# Patient Record
Sex: Female | Born: 1985 | Race: Black or African American | Hispanic: No | Marital: Single | Smoking: Current every day smoker
Health system: Southern US, Community
[De-identification: ages and names within clinical notes are randomized; demographics above are authoritative.]

## PROBLEM LIST (undated history)

## (undated) DIAGNOSIS — O009 Unspecified ectopic pregnancy without intrauterine pregnancy: Secondary | ICD-10-CM

## (undated) DIAGNOSIS — J45909 Unspecified asthma, uncomplicated: Secondary | ICD-10-CM

## (undated) DIAGNOSIS — N739 Female pelvic inflammatory disease, unspecified: Secondary | ICD-10-CM

## (undated) DIAGNOSIS — D649 Anemia, unspecified: Secondary | ICD-10-CM

## (undated) HISTORY — PX: ECTOPIC PREGNANCY SURGERY: SHX613

---
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
## 2005-06-20 ENCOUNTER — Emergency Department: Payer: Self-pay | Admitting: Emergency Medicine

## 2005-06-23 ENCOUNTER — Emergency Department: Payer: Self-pay | Admitting: Emergency Medicine

## 2005-11-26 ENCOUNTER — Emergency Department: Payer: Self-pay | Admitting: Emergency Medicine

## 2006-03-27 ENCOUNTER — Emergency Department: Payer: Self-pay | Admitting: Emergency Medicine

## 2006-04-20 ENCOUNTER — Emergency Department: Payer: Self-pay | Admitting: Emergency Medicine

## 2006-09-25 ENCOUNTER — Emergency Department: Payer: Self-pay | Admitting: Emergency Medicine

## 2006-09-30 ENCOUNTER — Emergency Department: Payer: Self-pay | Admitting: Emergency Medicine

## 2008-02-24 ENCOUNTER — Emergency Department: Payer: Self-pay | Admitting: Emergency Medicine

## 2008-04-15 ENCOUNTER — Emergency Department: Payer: Self-pay

## 2008-06-16 ENCOUNTER — Emergency Department: Payer: Self-pay | Admitting: Emergency Medicine

## 2008-09-03 ENCOUNTER — Emergency Department: Payer: Self-pay | Admitting: Emergency Medicine

## 2009-02-22 ENCOUNTER — Emergency Department: Payer: Self-pay | Admitting: Emergency Medicine

## 2009-03-13 ENCOUNTER — Emergency Department: Payer: Self-pay | Admitting: Internal Medicine

## 2009-07-19 ENCOUNTER — Emergency Department: Payer: Self-pay | Admitting: Unknown Physician Specialty

## 2009-08-14 ENCOUNTER — Emergency Department: Payer: Self-pay | Admitting: Emergency Medicine

## 2010-04-27 ENCOUNTER — Emergency Department (HOSPITAL_COMMUNITY): Admission: EM | Admit: 2010-04-27 | Discharge: 2010-04-27 | Payer: Self-pay | Admitting: Emergency Medicine

## 2011-08-02 ENCOUNTER — Emergency Department: Payer: Self-pay

## 2011-08-02 LAB — URINALYSIS, COMPLETE
Bacteria: NONE SEEN
Glucose,UR: NEGATIVE mg/dL (ref 0–75)
Ketone: NEGATIVE
Leukocyte Esterase: NEGATIVE
Nitrite: NEGATIVE
Protein: NEGATIVE
RBC,UR: 1 /HPF (ref 0–5)
WBC UR: 1 /HPF (ref 0–5)

## 2012-02-04 ENCOUNTER — Emergency Department: Payer: Self-pay | Admitting: *Deleted

## 2012-02-04 LAB — URINALYSIS, COMPLETE
Bacteria: NONE SEEN
Bilirubin,UR: NEGATIVE
Blood: NEGATIVE
Ph: 7 (ref 4.5–8.0)
RBC,UR: NONE SEEN /HPF (ref 0–5)
Specific Gravity: 1.019 (ref 1.003–1.030)
Squamous Epithelial: 1
WBC UR: NONE SEEN /HPF (ref 0–5)

## 2012-02-04 LAB — WET PREP, GENITAL

## 2012-09-05 ENCOUNTER — Emergency Department: Payer: Self-pay | Admitting: Emergency Medicine

## 2012-09-06 ENCOUNTER — Emergency Department: Payer: Self-pay | Admitting: Emergency Medicine

## 2013-08-04 ENCOUNTER — Emergency Department: Payer: Self-pay | Admitting: Emergency Medicine

## 2013-08-04 LAB — BASIC METABOLIC PANEL
Anion Gap: 7 (ref 7–16)
BUN: 10 mg/dL (ref 7–18)
CALCIUM: 8.7 mg/dL (ref 8.5–10.1)
CO2: 25 mmol/L (ref 21–32)
CREATININE: 0.87 mg/dL (ref 0.60–1.30)
Chloride: 109 mmol/L — ABNORMAL HIGH (ref 98–107)
EGFR (African American): >60
EGFR (Non-African Amer.): >60
GLUCOSE: 88 mg/dL (ref 65–99)
Osmolality: 280 (ref 275–301)
Potassium: 3.5 mmol/L (ref 3.5–5.1)
SODIUM: 141 mmol/L (ref 136–145)

## 2013-08-04 LAB — CBC
HCT: 40.3 % (ref 35.0–47.0)
HGB: 13.4 g/dL (ref 12.0–16.0)
MCH: 31 pg (ref 26.0–34.0)
MCHC: 33.3 g/dL (ref 32.0–36.0)
MCV: 93 fL (ref 80–100)
PLATELETS: 306 10*3/uL (ref 150–440)
RBC: 4.32 10*6/uL (ref 3.80–5.20)
RDW: 12.3 % (ref 11.5–14.5)
WBC: 9.5 10*3/uL (ref 3.6–11.0)

## 2013-08-04 LAB — TROPONIN I: Troponin-I: <0.02 ng/mL

## 2013-08-13 ENCOUNTER — Emergency Department: Payer: Self-pay | Admitting: Emergency Medicine

## 2013-08-13 LAB — COMPREHENSIVE METABOLIC PANEL
ALT: 18 U/L (ref 12–78)
Albumin: 3.7 g/dL (ref 3.4–5.0)
Alkaline Phosphatase: 81 U/L
Anion Gap: 2 — ABNORMAL LOW (ref 7–16)
BUN: 13 mg/dL (ref 7–18)
Bilirubin,Total: 0.1 mg/dL — ABNORMAL LOW (ref 0.2–1.0)
Calcium, Total: 8.7 mg/dL (ref 8.5–10.1)
Chloride: 107 mmol/L (ref 98–107)
Co2: 28 mmol/L (ref 21–32)
Creatinine: 0.86 mg/dL (ref 0.60–1.30)
EGFR (Non-African Amer.): >60
GLUCOSE: 77 mg/dL (ref 65–99)
Osmolality: 273 (ref 275–301)
POTASSIUM: 3.6 mmol/L (ref 3.5–5.1)
SGOT(AST): 23 U/L (ref 15–37)
SODIUM: 137 mmol/L (ref 136–145)
TOTAL PROTEIN: 7.4 g/dL (ref 6.4–8.2)

## 2013-08-13 LAB — CBC WITH DIFFERENTIAL/PLATELET
Basophil #: 0.1 10*3/uL (ref 0.0–0.1)
Basophil %: 1.2 %
EOS PCT: 2.1 %
Eosinophil #: 0.2 10*3/uL (ref 0.0–0.7)
HCT: 39.7 % (ref 35.0–47.0)
HGB: 13.4 g/dL (ref 12.0–16.0)
Lymphocyte #: 2.7 10*3/uL (ref 1.0–3.6)
Lymphocyte %: 33.1 %
MCH: 31.4 pg (ref 26.0–34.0)
MCHC: 33.8 g/dL (ref 32.0–36.0)
MCV: 93 fL (ref 80–100)
Monocyte #: 0.4 x10 3/mm (ref 0.2–0.9)
Monocyte %: 5.1 %
NEUTROS ABS: 4.8 10*3/uL (ref 1.4–6.5)
NEUTROS PCT: 58.5 %
Platelet: 244 10*3/uL (ref 150–440)
RBC: 4.27 10*6/uL (ref 3.80–5.20)
RDW: 12.4 % (ref 11.5–14.5)
WBC: 8.2 10*3/uL (ref 3.6–11.0)

## 2013-08-13 LAB — URINALYSIS, COMPLETE
Bacteria: NONE SEEN
Bilirubin,UR: NEGATIVE
Glucose,UR: NEGATIVE mg/dL (ref 0–75)
KETONE: NEGATIVE
Leukocyte Esterase: NEGATIVE
Nitrite: NEGATIVE
PH: 5 (ref 4.5–8.0)
PROTEIN: NEGATIVE
Specific Gravity: 1.02 (ref 1.003–1.030)

## 2015-02-28 ENCOUNTER — Encounter: Payer: Self-pay | Admitting: Emergency Medicine

## 2015-02-28 ENCOUNTER — Emergency Department
Admission: EM | Admit: 2015-02-28 | Discharge: 2015-02-28 | Disposition: A | Discharge status: Home / Self Care | Payer: Self-pay | Attending: Emergency Medicine | Admitting: Emergency Medicine

## 2015-02-28 ENCOUNTER — Emergency Department: Payer: Self-pay

## 2015-02-28 DIAGNOSIS — M25531 Pain in right wrist: Secondary | ICD-10-CM | POA: Insufficient documentation

## 2015-02-28 DIAGNOSIS — Z720 Tobacco use: Secondary | ICD-10-CM | POA: Insufficient documentation

## 2015-02-28 HISTORY — DX: Unspecified ectopic pregnancy without intrauterine pregnancy: O00.90

## 2015-02-28 HISTORY — DX: Unspecified asthma, uncomplicated: J45.909

## 2015-02-28 NOTE — ED Notes (Signed)
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  Pt reports increased pain, cramping and swelling of R wrist, that radiates to front of R shoulder and back of R neck.

## 2015-02-28 NOTE — ED Provider Notes (Signed)
[facility] Emergency Department Provider Note  ____________________________________________  Time seen: 4:40 PM  I have reviewed the triage vital signs and the nursing notes.   HISTORY  Chief Complaint Wrist Pain     HPI Nicole Gross is a 29 y.o. female presents with history of right wrist pain is currently 4 out of 10 worse with repetitive motion. Patient states that she works at [employer] and does to say motion repetitively with her right hand/wrist patient denies any blunt trauma to the wrist. States pain is worse with dorsiflexion of the wrist. Patient denies any numbness no weakness pain does not radiate into the hand/fingers.     Past Medical History  Diagnosis Date  . Asthma   . Ectopic pregnancy     x 2    There are no active problems to display for this patient.   Past surgical history None No current outpatient prescriptions on file.  Allergies No known drug allergies No family history on file.  Social History Social History  Substance Use Topics  . Smoking status: Current Every Day Smoker -- 0.50 packs/day    Types: Cigarettes  . Smokeless tobacco: None  . Alcohol Use: No    Review of Systems  Constitutional: Negative for fever. Eyes: Negative for visual changes. ENT: Negative for sore throat. Cardiovascular: Negative for chest pain. Respiratory: Negative for shortness of breath. Gastrointestinal: Negative for abdominal pain, vomiting and diarrhea. Genitourinary: Negative for dysuria. Musculoskeletal: Negative for back pain. Positive for right wrist pain Skin: Negative for rash. Neurological: Negative for headaches, focal weakness or numbness.   10-point ROS otherwise negative.  ____________________________________________   PHYSICAL EXAM:  VITAL SIGNS: ED Triage Vitals  Enc Vitals Group     BP 02/28/15 1523 112/59 mmHg     Pulse Rate 02/28/15 1523 67     Resp 02/28/15 1523 20     Temp 02/28/15 1523 98.2  F (36.8 C)     Temp Source 02/28/15 1523 Oral     SpO2 02/28/15 1523 99 %     Weight 02/28/15 1523 147 lb (66.679 kg)     Height 02/28/15 1523 4\' 11"  (1.499 m)     Head Cir --      Peak Flow --      Pain Score 02/28/15 1524 0     Pain Loc --      Pain Edu? --      Excl. in [place]? --      Constitutional: Alert and oriented. Well appearing and in no distress. Eyes: Conjunctivae are normal. PERRL. Normal extraocular movements. ENT   Head: Normocephalic and atraumatic.   Nose: No congestion/rhinnorhea.   Mouth/Throat: Mucous membranes are moist.   Neck: No stridor. Hematological/Lymphatic/Immunilogical: No cervical lymphadenopathy. Cardiovascular: Normal rate, regular rhythm. Normal and symmetric distal pulses are present in all extremities. No murmurs, rubs, or gallops. Respiratory: Normal respiratory effort without tachypnea nor retractions. Breath sounds are clear and equal bilaterally. No wheezes/rales/rhonchi. Gastrointestinal: Soft and nontender. No distention. There is no CVA tenderness. Genitourinary: deferred Musculoskeletal: Nontender with normal range of motion in all extremities. No joint effusions.  No lower extremity tenderness nor edema. Pain with dorsiflexion of the wrist both active and passive. Neurologic:  Normal speech and language. No gross focal neurologic deficits are appreciated. Speech is normal.  Skin:  Skin is warm, dry and intact. No rash noted. Psychiatric: Mood and affect are normal. Speech and behavior are normal. Patient exhibits appropriate insight and judgment.  RADIOLOGY  Right wrist x-ray revealed Results       DG Wrist Complete Right (Final result) Result time: 02/28/15 16:11:49   Final result by Rad Results In Interface (02/28/15 16:11:49)   Narrative:   CLINICAL DATA: Right wrist injury at work 02/25/2015. Continued pain. Initial encounter.  EXAM: RIGHT WRIST - COMPLETE 3+ VIEW  COMPARISON: None.  FINDINGS: No  acute bony or joint abnormality is identified. Soft tissues are unremarkable. Mild ulnar minus variance is noted.  IMPRESSION: No acute finding.   Electronically Signed By: Inge Rise M.D. On: 02/28/2015 16:11            INITIAL IMPRESSION / ASSESSMENT AND PLAN / ED COURSE  Pertinent labs & imaging results that were available during my care of the patient were reviewed by me and considered in my medical decision making (see chart for details).  History of physical exam consistent with possible ligamentous injury to the right wrist versus carpal tunnel however negative prior/Phalen test. Pain does not radiate a median nerve distribution. P right wrist cock-up splint was applied patient received ibuprofen 800 mg advised to wear the splint total resolution of discomfort. However given the possibility of carpal tunnel patient is advised to follow-up with Dr. Roland Rack orthopedic surgeon if pain reoccurs and certainly pain starts to radiate along the median nerve distribution. ________________________________________   FINAL CLINICAL IMPRESSION(S) / ED DIAGNOSES  Final diagnoses:  Wrist pain, acute, right      Gregor Hams, MD 02/28/15 [phone]

## 2015-02-28 NOTE — Discharge Instructions (Signed)

## 2015-05-31 ENCOUNTER — Encounter: Payer: Self-pay | Admitting: Medical Oncology

## 2015-05-31 ENCOUNTER — Emergency Department
Admission: EM | Admit: 2015-05-31 | Discharge: 2015-05-31 | Disposition: A | Discharge status: Home / Self Care | Payer: Self-pay | Attending: Emergency Medicine | Admitting: Emergency Medicine

## 2015-05-31 ENCOUNTER — Emergency Department: Payer: Self-pay

## 2015-05-31 DIAGNOSIS — J209 Acute bronchitis, unspecified: Secondary | ICD-10-CM | POA: Insufficient documentation

## 2015-05-31 DIAGNOSIS — F17210 Nicotine dependence, cigarettes, uncomplicated: Secondary | ICD-10-CM | POA: Insufficient documentation

## 2015-05-31 DIAGNOSIS — Z880 Allergy status to penicillin: Secondary | ICD-10-CM | POA: Insufficient documentation

## 2015-05-31 DIAGNOSIS — J40 Bronchitis, not specified as acute or chronic: Secondary | ICD-10-CM

## 2015-05-31 DIAGNOSIS — J069 Acute upper respiratory infection, unspecified: Secondary | ICD-10-CM | POA: Insufficient documentation

## 2015-05-31 MED ORDER — GUAIFENESIN-CODEINE 100-10 MG/5ML PO SOLN: 10.0000 mL | ORAL | Status: DC | PRN

## 2015-05-31 MED ORDER — AZITHROMYCIN 250 MG PO TABS: ORAL_TABLET | ORAL | Status: DC

## 2015-05-31 NOTE — ED Notes (Signed)
Pt reports cold sx's x 1 week with sore throat and cough with sob- hx of asthma.

## 2015-05-31 NOTE — Discharge Instructions (Signed)
Upper Respiratory Infection, Adult Most upper respiratory infections (URIs) are a viral infection of the air passages leading to the lungs. A URI affects the nose, throat, and upper air passages. The most common type of URI is nasopharyngitis and is typically referred to as "the common cold." URIs run their course and usually go away on their own. Most of the time, a URI does not require medical attention, but sometimes a bacterial infection in the upper airways can follow a viral infection. This is called a secondary infection. Sinus and middle ear infections are common types of secondary upper respiratory infections. Bacterial pneumonia can also complicate a URI. A URI can worsen asthma and chronic obstructive pulmonary disease (COPD). Sometimes, these complications can require emergency medical care and may be life threatening.  CAUSES Almost all URIs are caused by viruses. A virus is a type of germ and can spread from one person to another.  RISKS FACTORS You may be at risk for a URI if:   You smoke.   You have chronic heart or lung disease.  You have a weakened defense (immune) system.   You are very young or very old.   You have nasal allergies or asthma.  You work in crowded or poorly ventilated areas.  You work in health care facilities or schools. SIGNS AND SYMPTOMS  Symptoms typically develop 2-3 days after you come in contact with a cold virus. Most viral URIs last 7-10 days. However, viral URIs from the influenza virus (flu virus) can last 14-18 days and are typically more severe. Symptoms may include:   Runny or stuffy (congested) nose.   Sneezing.   Cough.   Sore throat.   Headache.   Fatigue.   Fever.   Loss of appetite.   Pain in your forehead, behind your eyes, and over your cheekbones (sinus pain).  Muscle aches.  DIAGNOSIS  Your health care provider may diagnose a URI by:  Physical exam.  Tests to check that your symptoms are not due to  another condition such as:  Strep throat.  Sinusitis.  Pneumonia.  Asthma. TREATMENT  A URI goes away on its own with time. It cannot be cured with medicines, but medicines may be prescribed or recommended to relieve symptoms. Medicines may help:  Reduce your fever.  Reduce your cough.  Relieve nasal congestion. HOME CARE INSTRUCTIONS   Take medicines only as directed by your health care provider.   Gargle warm saltwater or take cough drops to comfort your throat as directed by your health care provider.  Use a warm mist humidifier or inhale steam from a shower to increase air moisture. This may make it easier to breathe.  Drink enough fluid to keep your urine clear or pale yellow.   Eat soups and other clear broths and maintain good nutrition.   Rest as needed.   Return to work when your temperature has returned to normal or as your health care provider advises. You may need to stay home longer to avoid infecting others. You can also use a face mask and careful hand washing to prevent spread of the virus.  Increase the usage of your inhaler if you have asthma.   Do not use any tobacco products, including cigarettes, chewing tobacco, or electronic cigarettes. If you need help quitting, ask your health care provider. PREVENTION  The best way to protect yourself from getting a cold is to practice good hygiene.   Avoid oral or hand contact with people with cold   symptoms.   Wash your hands often if contact occurs.  There is no clear evidence that vitamin C, vitamin E, echinacea, or exercise reduces the chance of developing a cold. However, it is always recommended to get plenty of rest, exercise, and practice good nutrition.  SEEK MEDICAL CARE IF:   You are getting worse rather than better.   Your symptoms are not controlled by medicine.   You have chills.  You have worsening shortness of breath.  You have brown or red mucus.  You have yellow or brown nasal  discharge.  You have pain in your face, especially when you bend forward.  You have a fever.  You have swollen neck glands.  You have pain while swallowing.  You have white areas in the back of your throat. SEEK IMMEDIATE MEDICAL CARE IF:   You have severe or persistent:  Headache.  Ear pain.  Sinus pain.  Chest pain.  You have chronic lung disease and any of the following:  Wheezing.  Prolonged cough.  Coughing up blood.  A change in your usual mucus.  You have a stiff neck.  You have changes in your:  Vision.  Hearing.  Thinking.  Mood. MAKE SURE YOU:   Understand these instructions.  Will watch your condition.  Will get help right away if you are not doing well or get worse.   This information is not intended to replace advice given to you by your health care provider. Make sure you discuss any questions you have with your health care provider.   Document Released: 12/20/2000 Document Revised: 11/10/2014 Document Reviewed: 10/01/2013 Elsevier Interactive Patient Education 2016 [employer]  

## 2015-05-31 NOTE — ED Notes (Signed)
States she has had a cough for about 1 week  With some fever.states she is having some discomfort in chest with cough and breathing

## 2015-05-31 NOTE — ED Provider Notes (Signed)
[facility] Emergency Department Provider Note  ____________________________________________  Time seen: Approximately 7:35 PM  I have reviewed the triage vital signs and the nursing notes.   HISTORY  Chief Complaint URI    HPI Nicole Gross is a 29 y.o. female presents for evaluation of cough and chest pains for one week. Patient states that her difficulty or discomfort in her chest especially with deep breathing and coughing. She reports having a history of fever and chills with some body aches and coughing and congestion. Symptoms slightly improved at this time.   Past Medical History  Diagnosis Date  . Asthma   . Ectopic pregnancy     x 2    There are no active problems to display for this patient.   History reviewed. No pertinent past surgical history.  Current Outpatient Rx  Name  Route  Sig  Dispense  Refill  . azithromycin (ZITHROMAX Z-PAK) 250 MG tablet      Take 2 tablets (500 mg) on  Day 1,  followed by 1 tablet (250 mg) once daily on Days 2 through 5.   6 each   0   . guaiFENesin-codeine 100-10 MG/5ML syrup   Oral   Take 10 mLs by mouth every 4 (four) hours as needed for cough.   180 mL   0     Allergies Penicillins  No family history on file.  Social History Social History  Substance Use Topics  . Smoking status: Current Every Day Smoker -- 0.50 packs/day    Types: Cigarettes  . Smokeless tobacco: None  . Alcohol Use: No    Review of Systems Constitutional: No fever/chills Eyes: No visual changes. ENT: No sore throat. Cardiovascular: Complains of chest pain nonradiating. Respiratory: Denies shortness of breath. Positive for cough chest congestion Gastrointestinal: No abdominal pain.  No nausea, no vomiting.  No diarrhea.  No constipation. Genitourinary: Negative for dysuria. Musculoskeletal: Negative for back pain. Skin: Negative for rash. Neurological: Negative for headaches, focal weakness or  numbness.  10-point ROS otherwise negative.  ____________________________________________   PHYSICAL EXAM:  VITAL SIGNS: ED Triage Vitals  Enc Vitals Group     BP 05/31/15 1821 114/67 mmHg     Pulse Rate 05/31/15 1821 70     Resp 05/31/15 1821 18     Temp 05/31/15 1821 98.4 F (36.9 C)     Temp Source 05/31/15 1821 Oral     SpO2 05/31/15 1821 100 %     Weight 05/31/15 1821 150 lb (68.04 kg)     Height 05/31/15 1821 4\' 11"  (1.499 m)     Head Cir --      Peak Flow --      Pain Score 05/31/15 1821 7     Pain Loc --      Pain Edu? --      Excl. in [place]? --     Constitutional: Alert and oriented. Well appearing and in no acute distress. Eyes: Conjunctivae are normal. PERRL. EOMI. Head: Atraumatic. Nose: No congestion/rhinnorhea. Mouth/Throat: Mucous membranes are moist.  Oropharynx non-erythematous. Neck: No stridor.   Cardiovascular: Normal rate, regular rhythm. Grossly normal heart sounds.  Good peripheral circulation. Respiratory: Normal respiratory effort.  No retractions. Lungs with coarse rhonchi. Musculoskeletal: No lower extremity tenderness nor edema.  No joint effusions. Neurologic:  Normal speech and language. No gross focal neurologic deficits are appreciated. No gait instability. Skin:  Skin is warm, dry and intact. No rash noted. Psychiatric: Mood and affect are normal.  Speech and behavior are normal.  ____________________________________________   LABS (all labs ordered are listed, but only abnormal results are displayed)  Labs Reviewed - No data to display ____________________________________________  EKG  Normal sinus rhythm with nonspecific T-wave abnormality. ____________________________________________  RADIOLOGY  Negative for pneumonia. ____________________________________________   PROCEDURES  Procedure(s) performed: None  Critical Care performed: No  ____________________________________________   INITIAL IMPRESSION / ASSESSMENT AND  PLAN / ED COURSE  Pertinent labs & imaging results that were available during my care of the patient were reviewed by me and considered in my medical decision making (see chart for details).  Acute upper respiratory infection with bronchitis. Rx given for Z-Pak and Robitussin-AC. Patient voices no other emergency medical complaints at this time she will return to the ER with any worsening symptomology or follow-up with her PCP. ____________________________________________   FINAL CLINICAL IMPRESSION(S) / ED DIAGNOSES  Final diagnoses:  Bronchitis  URI, acute      Arlyss Repress, PA-C 05/31/15 2126  Orbie Pyo, MD 05/31/15 2342

## 2015-06-15 ENCOUNTER — Encounter: Payer: Self-pay | Admitting: *Deleted

## 2015-06-15 ENCOUNTER — Emergency Department
Admission: EM | Admit: 2015-06-15 | Discharge: 2015-06-15 | Disposition: A | Discharge status: Home / Self Care | Payer: Self-pay | Attending: Emergency Medicine | Admitting: Emergency Medicine

## 2015-06-15 DIAGNOSIS — J069 Acute upper respiratory infection, unspecified: Secondary | ICD-10-CM | POA: Insufficient documentation

## 2015-06-15 DIAGNOSIS — H9201 Otalgia, right ear: Secondary | ICD-10-CM | POA: Insufficient documentation

## 2015-06-15 DIAGNOSIS — Z880 Allergy status to penicillin: Secondary | ICD-10-CM | POA: Insufficient documentation

## 2015-06-15 DIAGNOSIS — J029 Acute pharyngitis, unspecified: Secondary | ICD-10-CM | POA: Insufficient documentation

## 2015-06-15 DIAGNOSIS — F17210 Nicotine dependence, cigarettes, uncomplicated: Secondary | ICD-10-CM | POA: Insufficient documentation

## 2015-06-15 DIAGNOSIS — J45909 Unspecified asthma, uncomplicated: Secondary | ICD-10-CM | POA: Insufficient documentation

## 2015-06-15 DIAGNOSIS — Z792 Long term (current) use of antibiotics: Secondary | ICD-10-CM | POA: Insufficient documentation

## 2015-06-15 LAB — POCT RAPID STREP A: STREPTOCOCCUS, GROUP A SCREEN (DIRECT): NEGATIVE

## 2015-06-15 MED ORDER — AZITHROMYCIN 250 MG PO TABS
500.0000 mg | ORAL_TABLET | Freq: Once | ORAL | Status: AC
  Administered 2015-06-15: 500 mg via ORAL
  Filled 2015-06-15: qty 2

## 2015-06-15 MED ORDER — GUAIFENESIN-CODEINE 100-10 MG/5ML PO SOLN
10.0000 mL | Freq: Once | ORAL | Status: AC
  Administered 2015-06-15: 10 mL via ORAL
  Filled 2015-06-15: qty 10

## 2015-06-15 NOTE — ED Provider Notes (Signed)
[facility] Emergency Department Provider Note  ____________________________________________  Time seen: Approximately 6:12 PM  I have reviewed the triage vital signs and the nursing notes.   HISTORY  Chief Complaint Sore Throat    HPI Nicole Gross is a 29 y.o. female who presents today for sore throat and right ear pain. Patient was seen here last week for URI but did not take any medications states that she denies any to get it filled. Needs a note for work. States that it does hurt to swallow   Past Medical History  Diagnosis Date  . Asthma   . Ectopic pregnancy     x 2    There are no active problems to display for this patient.   No past surgical history on file.  Current Outpatient Rx  Name  Route  Sig  Dispense  Refill  . azithromycin (ZITHROMAX Z-PAK) 250 MG tablet      Take 2 tablets (500 mg) on  Day 1,  followed by 1 tablet (250 mg) once daily on Days 2 through 5.   6 each   0   . guaiFENesin-codeine 100-10 MG/5ML syrup   Oral   Take 10 mLs by mouth every 4 (four) hours as needed for cough.   180 mL   0     Allergies Penicillins  No family history on file.  Social History Social History  Substance Use Topics  . Smoking status: Current Every Day Smoker -- 0.50 packs/day    Types: Cigarettes  . Smokeless tobacco: None  . Alcohol Use: No    Review of Systems Constitutional: No fever/chills Eyes: No visual changes. ENT: Positive sore throat, positive head congestion and nasal drainage. Cardiovascular: Denies chest pain. Respiratory: Denies shortness of breath. Positive cough Gastrointestinal: No abdominal pain.  No nausea, no vomiting.  No diarrhea.  No constipation. Genitourinary: Negative for dysuria. Musculoskeletal: Negative for back pain. Skin: Negative for rash. Neurological: Negative for headaches, focal weakness or numbness.  10-point ROS otherwise  negative.  ____________________________________________   PHYSICAL EXAM:  VITAL SIGNS: ED Triage Vitals  Enc Vitals Group     BP --      Pulse --      Resp --      Temp --      Temp src --      SpO2 --      Weight --      Height --      Head Cir --      Peak Flow --      Pain Score --      Pain Loc --      Pain Edu? --      Excl. in [place]? --     Constitutional: Alert and oriented. Well appearing and in no acute distress. Eyes: Conjunctivae are normal. PERRL. EOMI. Head: Atraumatic. Nose: Positive congestion/rhinnorhea. Mouth/Throat: Mucous membranes are moist.  Oropharynx non-erythematous. Neck: No stridor. No cervical adenopathy appreciated.  Cardiovascular: Normal rate, regular rhythm. Grossly normal heart sounds.  Good peripheral circulation. Respiratory: Normal respiratory effort.  No retractions. Lungs CTAB. Musculoskeletal: No lower extremity tenderness nor edema.  No joint effusions. Neurologic:  Normal speech and language. No gross focal neurologic deficits are appreciated. No gait instability. Skin:  Skin is warm, dry and intact. No rash noted. Psychiatric: Mood and affect are normal. Speech and behavior are normal.  ____________________________________________   LABS (all labs ordered are listed, but only abnormal results are displayed)  Labs Reviewed  POCT RAPID STREP A   ____________________________________________    PROCEDURES  Procedure(s) performed: None  Critical Care performed: No  ____________________________________________   INITIAL IMPRESSION / ASSESSMENT AND PLAN / ED COURSE  Pertinent labs & imaging results that were available during my care of the patient were reviewed by me and considered in my medical decision making (see chart for details).  Acute URI with pharyngitis. Patient is instructed to get her medications filled from the last visit and follow up with PCP or return here with any worsening symptomology. Work note given  for today. Patient to follow up as needed. ____________________________________________   FINAL CLINICAL IMPRESSION(S) / ED DIAGNOSES  Final diagnoses:  Acute pharyngitis, unspecified pharyngitis type  URI, acute      Arlyss Repress, PA-C 06/15/15 1836  Eula Listen, MD 06/15/15 2003

## 2015-06-15 NOTE — ED Notes (Signed)
Pt has a sore throat and cough.  Recent uri.  Sx for 2 weeks.  cig smoker

## 2015-06-15 NOTE — ED Notes (Signed)
poct strep Negative.  

## 2015-06-15 NOTE — Discharge Instructions (Signed)
Pharyngitis °Pharyngitis is redness, pain, and swelling (inflammation) of your pharynx.  °CAUSES  °Pharyngitis is usually caused by infection. Most of the time, these infections are from viruses (viral) and are part of a cold. However, sometimes pharyngitis is caused by bacteria (bacterial). Pharyngitis can also be caused by allergies. Viral pharyngitis may be spread from person to person by coughing, sneezing, and personal items or utensils (cups, forks, spoons, toothbrushes). Bacterial pharyngitis may be spread from person to person by more intimate contact, such as kissing.  °SIGNS AND SYMPTOMS  °Symptoms of pharyngitis include:   °· Sore throat.   °· Tiredness (fatigue).   °· Low-grade fever.   °· Headache. °· Joint pain and muscle aches. °· Skin rashes. °· Swollen lymph nodes. °· Plaque-like film on throat or tonsils (often seen with bacterial pharyngitis). °DIAGNOSIS  °Your health care provider will ask you questions about your illness and your symptoms. Your medical history, along with a physical exam, is often all that is needed to diagnose pharyngitis. Sometimes, a rapid strep test is done. Other lab tests may also be done, depending on the suspected cause.  °TREATMENT  °Viral pharyngitis will usually get better in 3-4 days without the use of medicine. Bacterial pharyngitis is treated with medicines that kill germs (antibiotics).  °HOME CARE INSTRUCTIONS  °· Drink enough water and fluids to keep your urine clear or pale yellow.   °· Only take over-the-counter or prescription medicines as directed by your health care provider:   °· If you are prescribed antibiotics, make sure you finish them even if you start to feel better.   °· Do not take aspirin.   °· Get lots of rest.   °· Gargle with 8 oz of salt water (½ tsp of salt per 1 qt of water) as often as every 1-2 hours to soothe your throat.   °· Throat lozenges (if you are not at risk for choking) or sprays may be used to soothe your throat. °SEEK MEDICAL  CARE IF:  °· You have large, tender lumps in your neck. °· You have a rash. °· You cough up green, yellow-brown, or bloody spit. °SEEK IMMEDIATE MEDICAL CARE IF:  °· Your neck becomes stiff. °· You drool or are unable to swallow liquids. °· You vomit or are unable to keep medicines or liquids down. °· You have severe pain that does not go away with the use of recommended medicines. °· You have trouble breathing (not caused by a stuffy nose). °MAKE SURE YOU:  °· Understand these instructions. °· Will watch your condition. °· Will get help right away if you are not doing well or get worse. °  °This information is not intended to replace advice given to you by your health care provider. Make sure you discuss any questions you have with your health care provider. °  °Document Released: 06/26/2005 Document Revised: 04/16/2013 Document Reviewed: 03/03/2013 °Elsevier Interactive Patient Education ©2016 [employer] °Upper Respiratory Infection, Adult °Most upper respiratory infections (URIs) are a viral infection of the air passages leading to the lungs. A URI affects the nose, throat, and upper air passages. The most common type of URI is nasopharyngitis and is typically referred to as "the common cold." °URIs run their course and usually go away on their own. Most of the time, a URI does not require medical attention, but sometimes a bacterial infection in the upper airways can follow a viral infection. This is called a secondary infection. Sinus and middle ear infections are common types of secondary upper respiratory infections. °Bacterial pneumonia   can also complicate a URI. A URI can worsen asthma and chronic obstructive pulmonary disease (COPD). Sometimes, these complications can require emergency medical care and may be life threatening.  °CAUSES °Almost all URIs are caused by viruses. A virus is a type of germ and can spread from one person to another.  °RISKS FACTORS °You may be at risk for a URI if:  °· You  smoke.   °· You have chronic heart or lung disease. °· You have a weakened defense (immune) system.   °· You are very young or very old.   °· You have nasal allergies or asthma. °· You work in crowded or poorly ventilated areas. °· You work in health care facilities or schools. °SIGNS AND SYMPTOMS  °Symptoms typically develop 2-3 days after you come in contact with a cold virus. Most viral URIs last 7-10 days. However, viral URIs from the influenza virus (flu virus) can last 14-18 days and are typically more severe. Symptoms may include:  °· Runny or stuffy (congested) nose.   °· Sneezing.   °· Cough.   °· Sore throat.   °· Headache.   °· Fatigue.   °· Fever.   °· Loss of appetite.   °· Pain in your forehead, behind your eyes, and over your cheekbones (sinus pain). °· Muscle aches.   °DIAGNOSIS  °Your health care provider may diagnose a URI by: °· Physical exam. °· Tests to check that your symptoms are not due to another condition such as: °¨ Strep throat. °¨ Sinusitis. °¨ Pneumonia. °¨ Asthma. °TREATMENT  °A URI goes away on its own with time. It cannot be cured with medicines, but medicines may be prescribed or recommended to relieve symptoms. Medicines may help: °· Reduce your fever. °· Reduce your cough. °· Relieve nasal congestion. °HOME CARE INSTRUCTIONS  °· Take medicines only as directed by your health care provider.   °· Gargle warm saltwater or take cough drops to comfort your throat as directed by your health care provider. °· Use a warm mist humidifier or inhale steam from a shower to increase air moisture. This may make it easier to breathe. °· Drink enough fluid to keep your urine clear or pale yellow.   °· Eat soups and other clear broths and maintain good nutrition.   °· Rest as needed.   °· Return to work when your temperature has returned to normal or as your health care provider advises. You may need to stay home longer to avoid infecting others. You can also use a face mask and careful hand  washing to prevent spread of the virus. °· Increase the usage of your inhaler if you have asthma.   °· Do not use any tobacco products, including cigarettes, chewing tobacco, or electronic cigarettes. If you need help quitting, ask your health care provider. °PREVENTION  °The best way to protect yourself from getting a cold is to practice good hygiene.  °· Avoid oral or hand contact with people with cold symptoms.   °· Wash your hands often if contact occurs.   °There is no clear evidence that vitamin C, vitamin E, echinacea, or exercise reduces the chance of developing a cold. However, it is always recommended to get plenty of rest, exercise, and practice good nutrition.  °SEEK MEDICAL CARE IF:  °· You are getting worse rather than better.   °· Your symptoms are not controlled by medicine.   °· You have chills. °· You have worsening shortness of breath. °· You have brown or red mucus. °· You have yellow or brown nasal discharge. °· You have pain in your face, especially   when you bend forward. °· You have a fever. °· You have swollen neck glands. °· You have pain while swallowing. °· You have white areas in the back of your throat. °SEEK IMMEDIATE MEDICAL CARE IF:  °· You have severe or persistent: °¨ Headache. °¨ Ear pain. °¨ Sinus pain. °¨ Chest pain. °· You have chronic lung disease and any of the following: °¨ Wheezing. °¨ Prolonged cough. °¨ Coughing up blood. °¨ A change in your usual mucus. °· You have a stiff neck. °· You have changes in your: °¨ Vision. °¨ Hearing. °¨ Thinking. °¨ Mood. °MAKE SURE YOU:  °· Understand these instructions. °· Will watch your condition. °· Will get help right away if you are not doing well or get worse. °  °This information is not intended to replace advice given to you by your health care provider. Make sure you discuss any questions you have with your health care provider. °  °Document Released: 12/20/2000 Document Revised: 11/10/2014 Document Reviewed: 10/01/2013 °Elsevier  Interactive Patient Education ©2016 Elsevier Inc. ° °

## 2015-06-15 NOTE — ED Notes (Signed)
Pt was seen here last week for uri.  Did Not take medicines.  Pt now has a sore throat, right earache and cough.  cig smoker.

## 2015-09-15 ENCOUNTER — Emergency Department: Payer: Self-pay

## 2015-09-15 ENCOUNTER — Encounter: Payer: Self-pay | Admitting: Emergency Medicine

## 2015-09-15 ENCOUNTER — Observation Stay
Admission: EM | Admit: 2015-09-15 | Discharge: 2015-09-17 | Disposition: A | Discharge status: Home / Self Care | Payer: Self-pay | Attending: Internal Medicine | Admitting: Internal Medicine

## 2015-09-15 DIAGNOSIS — D649 Anemia, unspecified: Secondary | ICD-10-CM

## 2015-09-15 DIAGNOSIS — R300 Dysuria: Secondary | ICD-10-CM | POA: Insufficient documentation

## 2015-09-15 DIAGNOSIS — R509 Fever, unspecified: Secondary | ICD-10-CM | POA: Insufficient documentation

## 2015-09-15 DIAGNOSIS — Z9889 Other specified postprocedural states: Secondary | ICD-10-CM | POA: Insufficient documentation

## 2015-09-15 DIAGNOSIS — D62 Acute posthemorrhagic anemia: Secondary | ICD-10-CM | POA: Insufficient documentation

## 2015-09-15 DIAGNOSIS — F17210 Nicotine dependence, cigarettes, uncomplicated: Secondary | ICD-10-CM | POA: Insufficient documentation

## 2015-09-15 DIAGNOSIS — Z87892 Personal history of anaphylaxis: Secondary | ICD-10-CM | POA: Insufficient documentation

## 2015-09-15 DIAGNOSIS — N390 Urinary tract infection, site not specified: Secondary | ICD-10-CM | POA: Insufficient documentation

## 2015-09-15 DIAGNOSIS — Z833 Family history of diabetes mellitus: Secondary | ICD-10-CM | POA: Insufficient documentation

## 2015-09-15 DIAGNOSIS — Z8719 Personal history of other diseases of the digestive system: Secondary | ICD-10-CM | POA: Insufficient documentation

## 2015-09-15 DIAGNOSIS — R8281 Pyuria: Secondary | ICD-10-CM

## 2015-09-15 DIAGNOSIS — Z8249 Family history of ischemic heart disease and other diseases of the circulatory system: Secondary | ICD-10-CM | POA: Insufficient documentation

## 2015-09-15 DIAGNOSIS — N739 Female pelvic inflammatory disease, unspecified: Principal | ICD-10-CM | POA: Insufficient documentation

## 2015-09-15 DIAGNOSIS — D72829 Elevated white blood cell count, unspecified: Secondary | ICD-10-CM | POA: Insufficient documentation

## 2015-09-15 DIAGNOSIS — R1013 Epigastric pain: Secondary | ICD-10-CM | POA: Insufficient documentation

## 2015-09-15 DIAGNOSIS — K529 Noninfective gastroenteritis and colitis, unspecified: Secondary | ICD-10-CM

## 2015-09-15 DIAGNOSIS — R932 Abnormal findings on diagnostic imaging of liver and biliary tract: Secondary | ICD-10-CM | POA: Insufficient documentation

## 2015-09-15 DIAGNOSIS — J45909 Unspecified asthma, uncomplicated: Secondary | ICD-10-CM | POA: Insufficient documentation

## 2015-09-15 DIAGNOSIS — K6389 Other specified diseases of intestine: Secondary | ICD-10-CM | POA: Diagnosis present

## 2015-09-15 DIAGNOSIS — Z880 Allergy status to penicillin: Secondary | ICD-10-CM | POA: Insufficient documentation

## 2015-09-15 DIAGNOSIS — R109 Unspecified abdominal pain: Secondary | ICD-10-CM

## 2015-09-15 DIAGNOSIS — R1031 Right lower quadrant pain: Secondary | ICD-10-CM | POA: Insufficient documentation

## 2015-09-15 DIAGNOSIS — Z720 Tobacco use: Secondary | ICD-10-CM

## 2015-09-15 DIAGNOSIS — R350 Frequency of micturition: Secondary | ICD-10-CM | POA: Insufficient documentation

## 2015-09-15 DIAGNOSIS — R1011 Right upper quadrant pain: Secondary | ICD-10-CM | POA: Insufficient documentation

## 2015-09-15 LAB — COMPREHENSIVE METABOLIC PANEL
ALBUMIN: 3.6 g/dL (ref 3.5–5.0)
ALT: 12 U/L — ABNORMAL LOW (ref 14–54)
ANION GAP: 7 (ref 5–15)
AST: 18 U/L (ref 15–41)
Alkaline Phosphatase: 58 U/L (ref 38–126)
BILIRUBIN TOTAL: 0.7 mg/dL (ref 0.3–1.2)
BUN: 6 mg/dL (ref 6–20)
CHLORIDE: 110 mmol/L (ref 101–111)
CO2: 24 mmol/L (ref 22–32)
Calcium: 8.8 mg/dL — ABNORMAL LOW (ref 8.9–10.3)
Creatinine, Ser: 0.85 mg/dL (ref 0.44–1.00)
GFR calc Af Amer: >60 mL/min (ref >60)
GFR calc non Af Amer: >60 mL/min (ref >60)
GLUCOSE: 80 mg/dL (ref 65–99)
POTASSIUM: 3.5 mmol/L (ref 3.5–5.1)
SODIUM: 141 mmol/L (ref 135–145)
TOTAL PROTEIN: 6.6 g/dL (ref 6.5–8.1)

## 2015-09-15 LAB — TROPONIN I

## 2015-09-15 LAB — CBC
HEMATOCRIT: 36.5 % (ref 35.0–47.0)
HEMOGLOBIN: 12.6 g/dL (ref 12.0–16.0)
MCH: 31.1 pg (ref 26.0–34.0)
MCHC: 34.5 g/dL (ref 32.0–36.0)
MCV: 89.9 fL (ref 80.0–100.0)
Platelets: 317 10*3/uL (ref 150–440)
RBC: 4.06 MIL/uL (ref 3.80–5.20)
RDW: 12.7 % (ref 11.5–14.5)
WBC: 14.5 10*3/uL — ABNORMAL HIGH (ref 3.6–11.0)

## 2015-09-15 LAB — URINALYSIS COMPLETE WITH MICROSCOPIC (ARMC ONLY)
BACTERIA UA: NONE SEEN
Bilirubin Urine: NEGATIVE
Glucose, UA: NEGATIVE mg/dL
NITRITE: NEGATIVE
PH: 5 (ref 5.0–8.0)
PROTEIN: NEGATIVE mg/dL
SPECIFIC GRAVITY, URINE: 1.018 (ref 1.005–1.030)

## 2015-09-15 LAB — MAGNESIUM: MAGNESIUM: 1.9 mg/dL (ref 1.7–2.4)

## 2015-09-15 LAB — PREGNANCY, URINE: Preg Test, Ur: NEGATIVE

## 2015-09-15 LAB — LIPASE, BLOOD: LIPASE: 11 U/L (ref 11–51)

## 2015-09-15 MED ORDER — ALBUTEROL SULFATE (2.5 MG/3ML) 0.083% IN NEBU: 2.5000 mg | INHALATION_SOLUTION | RESPIRATORY_TRACT | Status: DC | PRN

## 2015-09-15 MED ORDER — ENOXAPARIN SODIUM 40 MG/0.4ML ~~LOC~~ SOLN
40.0000 mg | SUBCUTANEOUS | Status: DC
  Administered 2015-09-15 – 2015-09-17 (×3): 40 mg via SUBCUTANEOUS
  Filled 2015-09-15 (×3): qty 0.4

## 2015-09-15 MED ORDER — MORPHINE SULFATE (PF) 2 MG/ML IV SOLN
2.0000 mg | INTRAVENOUS | Status: DC | PRN
  Administered 2015-09-15 – 2015-09-17 (×8): 2 mg via INTRAVENOUS
  Filled 2015-09-15 (×8): qty 1

## 2015-09-15 MED ORDER — MORPHINE SULFATE (PF) 4 MG/ML IV SOLN
4.0000 mg | Freq: Once | INTRAVENOUS | Status: AC
  Administered 2015-09-15: 4 mg via INTRAVENOUS
  Filled 2015-09-15: qty 1

## 2015-09-15 MED ORDER — ONDANSETRON HCL 4 MG/2ML IJ SOLN
4.0000 mg | Freq: Four times a day (QID) | INTRAMUSCULAR | Status: DC | PRN
  Administered 2015-09-16: 4 mg via INTRAVENOUS
  Filled 2015-09-15: qty 2

## 2015-09-15 MED ORDER — ACETAMINOPHEN 325 MG PO TABS
650.0000 mg | ORAL_TABLET | Freq: Four times a day (QID) | ORAL | Status: DC | PRN
  Administered 2015-09-15: 650 mg via ORAL

## 2015-09-15 MED ORDER — ONDANSETRON HCL 4 MG/2ML IJ SOLN
4.0000 mg | Freq: Once | INTRAMUSCULAR | Status: AC
  Administered 2015-09-15: 4 mg via INTRAVENOUS
  Filled 2015-09-15: qty 2

## 2015-09-15 MED ORDER — ONDANSETRON HCL 4 MG PO TABS: 4.0000 mg | ORAL_TABLET | Freq: Four times a day (QID) | ORAL | Status: DC | PRN

## 2015-09-15 MED ORDER — CIPROFLOXACIN IN D5W 400 MG/200ML IV SOLN
400.0000 mg | Freq: Two times a day (BID) | INTRAVENOUS | Status: DC
  Administered 2015-09-15: 14:00:00 400 mg via INTRAVENOUS
  Filled 2015-09-15 (×2): qty 200

## 2015-09-15 MED ORDER — NITROFURANTOIN MONOHYD MACRO 100 MG PO CAPS: 100.0000 mg | ORAL_CAPSULE | Freq: Two times a day (BID) | ORAL | Status: DC

## 2015-09-15 MED ORDER — IBUPROFEN 400 MG PO TABS
400.0000 mg | ORAL_TABLET | Freq: Three times a day (TID) | ORAL | Status: DC
  Administered 2015-09-15 – 2015-09-16 (×4): 400 mg via ORAL
  Filled 2015-09-15 (×4): qty 1

## 2015-09-15 MED ORDER — POTASSIUM CHLORIDE IN NACL 20-0.9 MEQ/L-% IV SOLN
INTRAVENOUS | Status: DC
  Administered 2015-09-15 – 2015-09-16 (×4): via INTRAVENOUS
  Filled 2015-09-15 (×8): qty 1000

## 2015-09-15 MED ORDER — ACETAMINOPHEN 650 MG RE SUPP: 650.0000 mg | Freq: Four times a day (QID) | RECTAL | Status: DC | PRN

## 2015-09-15 MED ORDER — IOHEXOL 300 MG/ML  SOLN
100.0000 mL | Freq: Once | INTRAMUSCULAR | Status: AC | PRN
  Administered 2015-09-15: 100 mL via INTRAVENOUS

## 2015-09-15 MED ORDER — MORPHINE SULFATE (PF) 4 MG/ML IV SOLN
4.0000 mg | Freq: Once | INTRAVENOUS | Status: AC
Start: 2015-09-15 — End: 2015-09-15
  Administered 2015-09-15: 4 mg via INTRAVENOUS
  Filled 2015-09-15: qty 1

## 2015-09-15 MED ORDER — IOHEXOL 240 MG/ML SOLN
25.0000 mL | Freq: Once | INTRAMUSCULAR | Status: AC | PRN
Start: 2015-09-15 — End: 2015-09-15
  Administered 2015-09-15: 25 mL via ORAL

## 2015-09-15 MED ORDER — HYDROCODONE-ACETAMINOPHEN 5-325 MG PO TABS
1.0000 | ORAL_TABLET | ORAL | Status: DC | PRN
  Administered 2015-09-16: 04:00:00 1 via ORAL
  Administered 2015-09-16: 2 via ORAL
  Administered 2015-09-16: 06:00:00 1 via ORAL
  Filled 2015-09-15 (×2): qty 1
  Filled 2015-09-15: qty 2

## 2015-09-15 MED ORDER — SODIUM CHLORIDE 0.9 % IV SOLN
1.0000 g | Freq: Three times a day (TID) | INTRAVENOUS | Status: DC
  Administered 2015-09-15 – 2015-09-17 (×5): 1 g via INTRAVENOUS
  Filled 2015-09-15 (×8): qty 1

## 2015-09-15 NOTE — ED Provider Notes (Signed)
[facility] Emergency Department Provider Note  ____________________________________________  Time seen: Approximately 504 AM  I have reviewed the triage vital signs and the nursing notes.   HISTORY  Chief Complaint Abdominal Pain    HPI Nicole Gross is a 30 y.o. female who comes into the hospital tonight with abdominal pain. The patient reports that the pain started 3 days ago. She is having pain in her epigastric area and it seems to be getting worse. The pain is radiating to her right upper abdomen. The patient reports that anytime she moves or tries to urinate the pain seems to be worse. She has not taken anything for her pain and these last 3 days. The patient rates her pain a 10 out of 10 in intensity. She has never had pain like this in the past. She denies any fevers, chest pain any shortness of breath nausea vomiting or diarrhea. The patient reports that she is very uncomfortable so she came into the hospital to get checked out. The patient is currently on her menstrual cycle.   Past Medical History  Diagnosis Date  . Asthma   . Ectopic pregnancy     x 2    There are no active problems to display for this patient.   Past Surgical History  Procedure Laterality Date  . Ectopic pregnancy surgery      No current outpatient prescriptions on file.  Allergies Amoxicillin and Penicillins  No family history on file.  Social History Social History  Substance Use Topics  . Smoking status: Current Every Day Smoker -- 0.50 packs/day    Types: Cigarettes  . Smokeless tobacco: Not on file  . Alcohol Use: No    Review of Systems Constitutional: No fever/chills Eyes: No visual changes. ENT: No sore throat. Cardiovascular: Denies chest pain. Respiratory: Denies shortness of breath. Gastrointestinal: abdominal pain.  No nausea, no vomiting.  No diarrhea.  No constipation. Genitourinary: Negative for dysuria. Musculoskeletal: Negative for back  pain. Skin: Negative for rash. Neurological: Negative for headaches, focal weakness or numbness.  10-point ROS otherwise negative.  ____________________________________________   PHYSICAL EXAM:  VITAL SIGNS: ED Triage Vitals  Enc Vitals Group     BP 09/15/15 0444 121/44 mmHg     Pulse Rate 09/15/15 0444 78     Resp 09/15/15 0444 18     Temp 09/15/15 0444 98.3 F (36.8 C)     Temp Source 09/15/15 0444 Oral     SpO2 09/15/15 0444 99 %     Weight --      Height --      Head Cir --      Peak Flow --      Pain Score 09/15/15 0441 8     Pain Loc --      Pain Edu? --      Excl. in [place]? --     Constitutional: Alert and oriented. Well appearing and in moderate distress. Eyes: Conjunctivae are normal. PERRL. EOMI. Head: Atraumatic. Nose: No congestion/rhinnorhea. Mouth/Throat: Mucous membranes are moist.  Oropharynx non-erythematous. Cardiovascular: Normal rate, regular rhythm. Grossly normal heart sounds.  Good peripheral circulation. Respiratory: Normal respiratory effort.  No retractions. Lungs CTAB. Gastrointestinal: Soft with diffuse abdominal tenderness to palpation worse in the epigastric and right upper quadrant area. No distention. Positive bowel sounds Musculoskeletal: No lower extremity tenderness nor edema.   Neurologic:  Normal speech and language.  Skin:  Skin is warm, dry and intact.  Psychiatric: Mood and affect are normal.  ____________________________________________   LABS (all labs ordered are listed, but only abnormal results are displayed)  Labs Reviewed  COMPREHENSIVE METABOLIC PANEL - Abnormal; Notable for the following:    Calcium 8.8 (*)    ALT 12 (*)    All other components within normal limits  CBC - Abnormal; Notable for the following:    WBC 14.5 (*)    All other components within normal limits  LIPASE, BLOOD  URINALYSIS COMPLETEWITH MICROSCOPIC ([facility] ONLY)   ____________________________________________  EKG  ED ECG REPORT I,  Loney Hering, the attending physician, personally viewed and interpreted this ECG.   Date: 09/15/2015  EKG Time: 447  Rate: 84  Rhythm: normal sinus rhythm  Axis: Normal  Intervals:none  ST&T Change: Flipped T waves in leads V 3, V4, V5, V6.  ____________________________________________  RADIOLOGY  Right upper quadrant ultrasound: No evidence of cholelithiasis or cholecystitis. Circumscribed lesion in the right lobe of the liver measuring 1.2 cm. ____________________________________________   PROCEDURES  Procedure(s) performed: None  Critical Care performed: No  ____________________________________________   INITIAL IMPRESSION / ASSESSMENT AND PLAN / ED COURSE  Pertinent labs & imaging results that were available during my care of the patient were reviewed by me and considered in my medical decision making (see chart for details).  This is a 30 year old female who comes into the hospital today with some epigastric and right upper quadrant tenderness to palpation. The patient does have a white blood cell count of 14.5. I will give the patient some morphine as well as Zofran. The patient receive a right upper quadrant ultrasound to evaluate her gallbladder and she'll be reassessed once I received the results.  The patient's ultrasound is unremarkable. I will send the patient for a CT scan of her abdomen given the severe pain that she was having. The patient's care was signed out to Dr. Cinda Quest who will follow-up the results of the CT scan and disposition the patient. ____________________________________________   FINAL CLINICAL IMPRESSION(S) / ED DIAGNOSES  Final diagnoses:  Abdominal pain      Loney Hering, MD 09/15/15 [phone]

## 2015-09-15 NOTE — ED Notes (Signed)
Returned from ct scan via stretcher.

## 2015-09-15 NOTE — ED Provider Notes (Signed)
Patient's CT comes back showing some vague inflammation in the left paracolic gutter. Patient's white count is elevated patient's abdomen is tender quite tender in fact to light percussion. I have given the patient's chart to the hospitalist for admission.  Nena Polio, MD 09/15/15 1053

## 2015-09-15 NOTE — ED Notes (Signed)
Pt having abdominal pain that per EMS started in lower abdomen, but now pt stating is in upper abdomen.  Pt is on 6th day of period.  Pt has had no nausea and has had normal BMs.  Pt taking in fluids well.

## 2015-09-15 NOTE — H&P (Addendum)
[facility] [place] NAME: Nicole Gross    MR#:  [id number]  DATE OF BIRTH:  1985/11/24  DATE OF ADMISSION:  09/15/2015  PRIMARY CARE PHYSICIAN: No primary care provider on file.   REQUESTING/REFERRING PHYSICIAN: Nena Polio, MD  CHIEF COMPLAINT:   Chief Complaint  Patient presents with  . Abdominal Pain   Abdominal pain for 3 days. HISTORY OF PRESENT ILLNESS:  Nicole Gross  is a 30 y.o. female with a known history of asthma.  The patient presents to the ED with abdominal pain. 3 days. Abdominal pain is mostly on the right side in the lower part, intermittent, 8 out of 10 without radiation. Patient also complains of dysuria and urinary frequency. But she denies any fever or chills, no nausea vomiting or diarrhea. No melena but had 1 episode of bloody stool 1 week ago. CAT scan of abdominem shows postinflammatory or infectious. Differential considerations include but are not limited to epiploic appendagitis, endometriosis, or other nonspecific inflammatory process. Pregnancy test is negative.   PAST MEDICAL HISTORY:   Past Medical History  Diagnosis Date  . Asthma   . Ectopic pregnancy     x 2    PAST SURGICAL HISTORY:   Past Surgical History  Procedure Laterality Date  . Ectopic pregnancy surgery      SOCIAL HISTORY:   Social History  Substance Use Topics  . Smoking status: Current Every Day Smoker -- 0.50 packs/day    Types: Cigarettes  . Smokeless tobacco: Not on file  . Alcohol Use: No    FAMILY HISTORY:   Family History  Problem Relation Age of Onset  . Hypertension Mother   . Diabetes Mother   . Hypertension Father   . Diabetes Father     DRUG ALLERGIES:   Allergies  Allergen Reactions  . Amoxicillin Anaphylaxis and Rash  . Penicillins Anaphylaxis and Rash    Has patient had a PCN reaction causing immediate rash, facial/tongue/throat swelling, SOB or lightheadedness with hypotension: yes: Has  patient had a PCN reaction causing severe rash involving mucus membranes or skin necrosis: no Has patient had a PCN reaction that required hospitalization no Has patient had a PCN reaction occurring within the last 10 years: no1} If all of the above answers are "NO", then may proceed with Cephalosporin use.     REVIEW OF SYSTEMS:  CONSTITUTIONAL: No fever, fatigue or weakness.  EYES: No blurred or double vision.  EARS, NOSE, AND THROAT: No tinnitus or ear pain.  RESPIRATORY: No cough, shortness of breath, wheezing or hemoptysis.  CARDIOVASCULAR: No chest pain, orthopnea, edema.  GASTROINTESTINAL: No nausea, vomiting, diarrhea  but has abdominal pain.  GENITOURINARY: has dysuria and urinary frequency , but no  hematuria.  ENDOCRINE: No polyuria, nocturia,  HEMATOLOGY: No anemia, easy bruising or bleeding SKIN: No rash or lesion. MUSCULOSKELETAL: No joint pain or arthritis.   NEUROLOGIC: No tingling, numbness, weakness.  PSYCHIATRY: No anxiety or depression.   MEDICATIONS AT HOME:   Prior to Admission medications   Not on File      VITAL SIGNS:  Blood pressure 119/72, pulse 85, temperature 98.3 F (36.8 C), temperature source Oral, resp. rate 25, last menstrual period 09/09/2015, SpO2 99 %.  PHYSICAL EXAMINATION:  GENERAL:  30 y.o.-year-old patient lying in the bed with no acute distress.  EYES: Pupils equal, round, reactive to light and accommodation. No scleral icterus. Extraocular muscles intact.  HEENT: Head atraumatic, normocephalic. Oropharynx and  nasopharynx clear.  NECK:  Supple, no jugular venous distention. No thyroid enlargement, no tenderness.  LUNGS: Normal breath sounds bilaterally, no wheezing, rales,rhonchi or crepitation. No use of accessory muscles of respiration.  CARDIOVASCULAR: S1, S2 normal. No murmurs, rubs, or gallops.  ABDOMEN: Soft, diffuse tenderness, but worse on RUQ and RLQ  , nondistended. Bowel sounds present. No organomegaly or mass.   EXTREMITIES: No pedal edema, cyanosis, or clubbing.  NEUROLOGIC: Cranial nerves II through XII are intact. Muscle strength 5/5 in all extremities. Sensation intact. Gait not checked.  PSYCHIATRIC: The patient is alert and oriented x 3.  SKIN: No obvious rash, lesion, or ulcer.   LABORATORY PANEL:   CBC  Recent Labs Lab 09/15/15 0444  WBC 14.5*  HGB 12.6  HCT 36.5  PLT 317   ------------------------------------------------------------------------------------------------------------------  Chemistries   Recent Labs Lab 09/15/15 0444  NA 141  K 3.5  CL 110  CO2 24  GLUCOSE 80  BUN 6  CREATININE 0.85  CALCIUM 8.8*  AST 18  ALT 12*  ALKPHOS 58  BILITOT 0.7   ------------------------------------------------------------------------------------------------------------------  Cardiac Enzymes  Recent Labs Lab 09/15/15 0444  TROPONINI <0.03   ------------------------------------------------------------------------------------------------------------------  RADIOLOGY:  Ct Abdomen Pelvis W Contrast  09/15/2015  CLINICAL DATA:  Abdominal pain EXAM: CT ABDOMEN AND PELVIS WITH CONTRAST TECHNIQUE: Multidetector CT imaging of the abdomen and pelvis was performed using the standard protocol following bolus administration of intravenous contrast. CONTRAST:  142mL OMNIPAQUE IOHEXOL 300 MG/ML  SOLN COMPARISON:  06/20/2005 FINDINGS: Lower chest:  No acute findings. Hepatobiliary: 8 mm mixed attenuation structure within right lobe of liver is identified, image 9 of series 2. Hyper attenuating structure within the lateral aspect of right lobe of liver measures 1 cm, image 15/series 2. Pancreas: No mass, inflammatory changes, or other significant abnormality. Spleen: Within normal limits in size and appearance. Adrenals/Urinary Tract: No masses identified. No evidence of hydronephrosis. Stomach/Bowel: No evidence of obstruction, inflammatory process, or abnormal fluid collections. The  appendix is visualized and appears normal. Vascular/Lymphatic: No pathologically enlarged lymph nodes. No evidence of abdominal aortic aneurysm. Reproductive: The uterus and adnexal structures are unremarkable. Other: Trace free fluid noted within the pelvis. Along the left pericolic gutter there are several subtle areas of soft tissue stranding within the peritoneal fat posterior to the colon. For example, image number 53 of series 2 and image number 54 of series 5. Musculoskeletal:  No suspicious bone lesions identified. IMPRESSION: 1. Trace free fluid noted within the pelvis. Additionally, several areas of nonspecific soft tissue stranding within the fat along the left pericolic gutter posterior to the colon are noted. This is favored to be postinflammatory or infectious. Differential considerations include but are not limited to epiploic appendagitis, endometriosis, or other nonspecific inflammatory process. Although felt much less likely peritoneal disease from malignancy may have a similar appearance. A followup examination in 3 months with repeat CT of the abdomen pelvis is suggested to confirm resolution. 2. No evidence for acute appendicitis. 3. There are several lesions identified within the liver, these are favored to represent benign hemangiomas. Electronically Signed   By: Kerby Moors M.D.   On: 09/15/2015 09:50   US Abdomen Limited Ruq  09/15/2015  CLINICAL DATA:  Abdominal pain for 2 days. Previous history of ectopic pregnancy. EXAM: US ABDOMEN LIMITED - RIGHT UPPER QUADRANT COMPARISON:  None. FINDINGS: Gallbladder: No gallstones or wall thickening visualized. No sonographic Murphy sign noted by sonographer. Common bile duct: Diameter: 3.2 mm, normal Liver: Circumscribed hyperechoic lesion  demonstrated in the anterior right lobe of liver measuring about 1.2 cm maximal dimension. Appearance is consistent with hemangioma. No other focal liver lesions identified. Parenchymal echotexture is otherwise  homogeneous. IMPRESSION: No evidence of cholelithiasis or cholecystitis. Circumscribed lesion in the right lobe of liver measuring 1.2 cm has appearance of cavernous hemangioma. Electronically Signed   By: Lucienne Capers M.D.   On: 09/15/2015 06:42    EKG:   Orders placed or performed during the hospital encounter of 09/15/15  . EKG 12-Lead  . EKG 12-Lead  . EKG 12-Lead  . EKG 12-Lead    IMPRESSION AND PLAN:   Abdominal pain, possible epiploic appendagitis. Placed the patient for observation. Pain control with ibuprofen, morphine and norco prn. Surgical consult prn. Leukocytosis. Follow-up CBC. Possible UTI. Follow urine culture and start cipro.  Tobacco abuse, smoking cessation was counseled for 3-4 minutes.   All the records are reviewed and case discussed with ED provider. Management plans discussed with the patient, family and they are in agreement.  CODE STATUS: full code.  TOTAL TIME TAKING CARE OF THIS PATIENT: 50 minutes.    Demetrios Loll M.D on 09/15/2015 at 11:10 AM  Between 7am to 6pm - Pager - [phone]  After 6pm go to [email or web address] - password [email or web address] [facility]  [facility]  Office  [phone]  CC: Primary care physician; No primary care provider on file.

## 2015-09-15 NOTE — Progress Notes (Signed)
Pharmacy Antibiotic Note  Nicole Gross is a 30 y.o. female admitted on 09/15/2015 with UTI and intra-abdominal infection.  Pharmacy has been consulted for Meropenem dosing.  Plan: Spoke with MD Bridgett Larsson regarding antibiotics for UTI coverage. MD would also like anaerobic coverage for intra-abdominal infection as well.  Patient with allergy to Penicillins and Ciprofloxacin (anaphylaxis/SOB).  MD okay with trying patient on Meropenem and RN made aware to watch for possible allergic reaction.  Orders entered for Meropenem 1 gm IV q8h based on renal function and indication.   Height: 4\' 11"  (149.9 cm) Weight: 133 lb 8 oz (60.555 kg) IBW/kg (Calculated) : 43.2  Temp (24hrs), Avg:100.1 F (37.8 C), Min:98.3 F (36.8 C), Max:102.2 F (39 C)   Recent Labs Lab 09/15/15 0444  WBC 14.5*  CREATININE 0.85    Estimated Creatinine Clearance: 77.4 mL/min (by C-G formula based on Cr of 0.85).    Allergies  Allergen Reactions  . Amoxicillin Anaphylaxis and Rash  . Ciprofloxacin Shortness Of Breath and Itching  . Penicillins Anaphylaxis and Rash    Has patient had a PCN reaction causing immediate rash, facial/tongue/throat swelling, SOB or lightheadedness with hypotension: yes: Has patient had a PCN reaction causing severe rash involving mucus membranes or skin necrosis: no Has patient had a PCN reaction that required hospitalization no Has patient had a PCN reaction occurring within the last 10 years: no1} If all of the above answers are "NO", then may proceed with Cephalosporin use.     Antimicrobials this admission: Meropenem 3/8 >>   Microbiology results: 3/8 BCx: pending 3/8 [id number]   Thank you for allowing pharmacy to be a part of this patient's care.  Dollene Mallery G 09/15/2015 5:35 PM

## 2015-09-16 DIAGNOSIS — K529 Noninfective gastroenteritis and colitis, unspecified: Secondary | ICD-10-CM

## 2015-09-16 LAB — CBC
HEMATOCRIT: 33.2 % — AB (ref 35.0–47.0)
Hemoglobin: 11 g/dL — ABNORMAL LOW (ref 12.0–16.0)
MCH: 30.8 pg (ref 26.0–34.0)
MCHC: 33.2 g/dL (ref 32.0–36.0)
MCV: 92.6 fL (ref 80.0–100.0)
PLATELETS: 255 10*3/uL (ref 150–440)
RBC: 3.59 MIL/uL — ABNORMAL LOW (ref 3.80–5.20)
RDW: 12.6 % (ref 11.5–14.5)
WBC: 12.1 10*3/uL — AB (ref 3.6–11.0)

## 2015-09-16 LAB — BASIC METABOLIC PANEL
Anion gap: 6 (ref 5–15)
BUN: 5 mg/dL — AB (ref 6–20)
CHLORIDE: 112 mmol/L — AB (ref 101–111)
CO2: 24 mmol/L (ref 22–32)
CREATININE: 0.84 mg/dL (ref 0.44–1.00)
Calcium: 8.1 mg/dL — ABNORMAL LOW (ref 8.9–10.3)
GFR calc Af Amer: >60 mL/min (ref >60)
GFR calc non Af Amer: >60 mL/min (ref >60)
GLUCOSE: 97 mg/dL (ref 65–99)
POTASSIUM: 3.7 mmol/L (ref 3.5–5.1)
Sodium: 142 mmol/L (ref 135–145)

## 2015-09-16 LAB — WET PREP, GENITAL
CLUE CELLS WET PREP: NONE SEEN
Sperm: NONE SEEN
Trich, Wet Prep: NONE SEEN
Yeast Wet Prep HPF POC: NONE SEEN

## 2015-09-16 MED ORDER — NICOTINE POLACRILEX 2 MG MT GUM
2.0000 mg | CHEWING_GUM | OROMUCOSAL | Status: DC | PRN
  Filled 2015-09-16: qty 1

## 2015-09-16 MED ORDER — DOCUSATE SODIUM 100 MG PO CAPS
100.0000 mg | ORAL_CAPSULE | Freq: Two times a day (BID) | ORAL | Status: DC
  Filled 2015-09-16 (×2): qty 1

## 2015-09-16 MED ORDER — SENNA 8.6 MG PO TABS
1.0000 | ORAL_TABLET | Freq: Every day | ORAL | Status: DC
  Administered 2015-09-16: 19:00:00 8.6 mg via ORAL
  Filled 2015-09-16 (×2): qty 1

## 2015-09-16 NOTE — Consult Note (Signed)
Patient ID: Nicole Gross, female   DOB: Mar 04, 1986, 30 y.o.   MRN: [id number]  CC: ABDOMINAL PAIN  HPI Nicole Gross is a 30 y.o. female who is currently admitted to the medicine service for abdominal pain. Surgery consult requested by Dr. Ether Griffins. Patient reports that prior, the hospital [facility]edar three-day history of abdominal pain. The pain is always been in lower abdomen and intermittent but would spike in intensity throughout the day. Patient denies any fevers, chills, nausea, vomiting, diarrhea. Patient does states she's had some dysuria and since admission to the hospital she is noted vaginal discharge is green as well. She states she's never had anything like this before. Her only other prior abdominal issues have been ectopic pregnancies. Patient states that she is been feeling much better since admission with the initiation of antibiotics.  HPI  Past Medical History  Diagnosis Date  . Asthma   . Ectopic pregnancy     x 2    Past Surgical History  Procedure Laterality Date  . Ectopic pregnancy surgery      Family History  Problem Relation Age of Onset  . Hypertension Mother   . Diabetes Mother   . Hypertension Father   . Diabetes Father     Social History Social History  Substance Use Topics  . Smoking status: Current Every Day Smoker -- 0.50 packs/day for 10 years    Types: Cigarettes  . Smokeless tobacco: None  . Alcohol Use: No    Allergies  Allergen Reactions  . Amoxicillin Anaphylaxis and Rash  . Ciprofloxacin Shortness Of Breath and Itching  . Penicillins Anaphylaxis and Rash    Has patient had a PCN reaction causing immediate rash, facial/tongue/throat swelling, SOB or lightheadedness with hypotension: yes: Has patient had a PCN reaction causing severe rash involving mucus membranes or skin necrosis: no Has patient had a PCN reaction that required hospitalization no Has patient had a PCN reaction occurring within the last 10 years: no1} If all of the above  answers are "NO", then may proceed with Cephalosporin use.     Current Facility-Administered Medications  Medication Dose Route Frequency Provider Last Rate Last Dose  . 0.9 % NaCl with KCl 20 mEq/ L  infusion   Intravenous Continuous Demetrios Loll, MD 100 mL/hr at 09/16/15 2125    . acetaminophen (TYLENOL) tablet 650 mg  650 mg Oral Q6H PRN Demetrios Loll, MD   650 mg at 09/15/15 1703   Or  . acetaminophen (TYLENOL) suppository 650 mg  650 mg Rectal Q6H PRN Demetrios Loll, MD      . albuterol (PROVENTIL) (2.5 MG/3ML) 0.083% nebulizer solution 2.5 mg  2.5 mg Nebulization Q2H PRN Demetrios Loll, MD      . docusate sodium (COLACE) capsule 100 mg  100 mg Oral BID Theodoro Grist, MD      . enoxaparin (LOVENOX) injection 40 mg  40 mg Subcutaneous Q24H Demetrios Loll, MD   40 mg at 09/16/15 1346  . HYDROcodone-acetaminophen (NORCO/VICODIN) 5-325 MG per tablet 1-2 tablet  1-2 tablet Oral Q4H PRN Demetrios Loll, MD   2 tablet at 09/16/15 1125  . meropenem (MERREM) 1 g in sodium chloride 0.9 % 100 mL IVPB  1 g Intravenous 3 times per day Demetrios Loll, MD   1 g at 09/16/15 2125  . morphine 2 MG/ML injection 2 mg  2 mg Intravenous Q4H PRN Demetrios Loll, MD   2 mg at 09/16/15 2145  . nicotine polacrilex (NICORETTE) gum 2 mg  2 mg  Oral PRN Theodoro Grist, MD      . ondansetron (ZOFRAN) tablet 4 mg  4 mg Oral Q6H PRN Demetrios Loll, MD       Or  . ondansetron [facility]) injection 4 mg  4 mg Intravenous Q6H PRN Demetrios Loll, MD   4 mg at 09/16/15 1940  . senna (SENOKOT) tablet 8.6 mg  1 tablet Oral Daily Theodoro Grist, MD   8.6 mg at 09/16/15 1831     Review of Systems A Multi-point review of systems was asked and was negative except for the findings documented in the history of present illness   Physical Exam Blood pressure 98/54, pulse 70, temperature 99.1 F (37.3 C), temperature source Oral, resp. rate 20, height 4\' 11"  (1.499 m), weight 60.555 kg (133 lb 8 oz), last menstrual period 09/09/2015, SpO2 100 %. CONSTITUTIONAL: No acute  distress. EYES: Pupils are equal, round, and reactive to light, Sclera are non-icteric. EARS, NOSE, MOUTH AND THROAT: The oropharynx is clear. The oral mucosa is pink and moist. Hearing is intact to voice. LYMPH NODES:  Lymph nodes in the neck are normal. RESPIRATORY:  Lungs are clear. There is normal respiratory effort, with equal breath sounds bilaterally, and without pathologic use of accessory muscles. CARDIOVASCULAR: Heart is regular without murmurs, gallops, or rubs. GI: The abdomen is soft, minimally tender to deep palpation in the lower abdomen, and nondistended. There are no palpable masses. There is no hepatosplenomegaly. There are normal bowel sounds in all quadrants. GU: Rectal deferred.   MUSCULOSKELETAL: Normal muscle strength and tone. No cyanosis or edema.   SKIN: Turgor is good and there are no pathologic skin lesions or ulcers. NEUROLOGIC: Motor and sensation is grossly normal. Cranial nerves are grossly intact. PSYCH:  Oriented to person, place and time. Affect is normal.  Data Reviewed Labs reviewed showed a mild leukocytosis of 12.1 however was 14.5 on admission. CT scan reviewed which appears to show some free fluid in the pelvis as well as evidence of inflammation just lateral to her sigmoid colon. There is no evidence of free air or abscess within the abdomen. There is no evidence of diverticuli of the sigmoid colon near the area of inflammation. I have personally reviewed the patient's imaging, laboratory findings and medical records.    Assessment    30 year old female with abdominal pain    Plan    Abdominal pain. Multiple possible etiologies. Patient has started having a greenish vaginal discharge since admission which makes a GYN origin appeared to be more likely then a colonic origin. However, CT images do appear to show epiploic appendage otitis of the sigmoid colon on one image. Epiploic appendage otitis does not require any surgical intervention and is  self-limiting. Given her leukocytosis and a new vaginal discharge, would recommend GYN evaluation. General surgery will follow for now, however there are no plans for any surgical intervention.     Time spent with the patient was 60 minutes, with more than 50% of the time spent in face-to-face education, counseling and care coordination.     Clayburn Pert, MD FACS General Surgeon 09/16/2015, 10:24 PM

## 2015-09-16 NOTE — Progress Notes (Signed)
[facility] [place] NAME: Nicole Gross    MR#:  [id number]  DATE OF BIRTH:  [birth year]  SUBJECTIVE:  CHIEF COMPLAINT:   Chief Complaint  Patient presents with  . Abdominal Pain   patient has 30 year old African-American female with asthma medical history significant for history of asthma who presents to the hospital with complaints of lower abdominal pain, intermittent, 8 out of 10 by intensity with no radiation, dysuria, increased urgency of urination. Patient admits of one episode of bloody stool about one week ago. CT scan emergency room revealed left pericolic gutter posterior to the colon soft tissue stranding, concerning for infection versus inflammation. Patient was initiated on meropenem IV fluids and clinically improved. She admits of less abdominal pain, no nausea.   Review of Systems  Constitutional: Negative for fever, chills and weight loss.  HENT: Negative for congestion.   Eyes: Negative for blurred vision and double vision.  Respiratory: Negative for cough, sputum production, shortness of breath and wheezing.   Cardiovascular: Negative for chest pain, palpitations, orthopnea, leg swelling and PND.  Gastrointestinal: Positive for abdominal pain and blood in stool. Negative for nausea, vomiting, diarrhea and constipation.  Genitourinary: Negative for dysuria, urgency, frequency and hematuria.  Musculoskeletal: Negative for falls.  Neurological: Negative for dizziness, tremors, focal weakness and headaches.  Endo/Heme/Allergies: Does not bruise/bleed easily.  Psychiatric/Behavioral: Negative for depression. The patient does not have insomnia.     VITAL SIGNS: Blood pressure 91/52, pulse 75, temperature 98 F (36.7 C), temperature source Oral, resp. rate 18, height 4\' 11"  (1.499 m), weight 60.555 kg (133 lb 8 oz), last menstrual period 09/09/2015, SpO2 100 %.  PHYSICAL EXAMINATION:   GENERAL:  30 y.o.-year-old patient  lying in the bed with no acute distress.  EYES: Pupils equal, round, reactive to light and accommodation. No scleral icterus. Extraocular muscles intact.  HEENT: Head atraumatic, normocephalic. Oropharynx and nasopharynx clear.  NECK:  Supple, no jugular venous distention. No thyroid enlargement, no tenderness.  LUNGS: Normal breath sounds bilaterally, no wheezing, rales,rhonchi or crepitation. No use of accessory muscles of respiration.  CARDIOVASCULAR: S1, S2 normal. No murmurs, rubs, or gallops.  ABDOMEN: Soft, diffusely tender, mostly right upper right lower quadrants, also suprapubically, voluntary guarding in lower part of abdomen, mildly distended. Bowel sounds present. No organomegaly or mass.  EXTREMITIES: No pedal edema, cyanosis, or clubbing.  NEUROLOGIC: Cranial nerves II through XII are intact. Muscle strength 5/5 in all extremities. Sensation intact. Gait not checked.  PSYCHIATRIC: The patient is alert and oriented x 3.  SKIN: No obvious rash, lesion, or ulcer.   ORDERS/RESULTS REVIEWED:   CBC  Recent Labs Lab 09/15/15 0444 09/16/15 0436  WBC 14.5* 12.1*  HGB 12.6 11.0*  HCT 36.5 33.2*  PLT 317 255  MCV 89.9 92.6  MCH 31.1 30.8  MCHC 34.5 33.2  RDW 12.7 12.6   ------------------------------------------------------------------------------------------------------------------  Chemistries   Recent Labs Lab 09/15/15 0444 09/15/15 1324 09/16/15 0436  NA 141  --  142  K 3.5  --  3.7  CL 110  --  112*  CO2 24  --  24  GLUCOSE 80  --  97  BUN 6  --  5*  CREATININE 0.85  --  0.84  CALCIUM 8.8*  --  8.1*  MG  --  1.9  --   AST 18  --   --   ALT 12*  --   --   ALKPHOS 58  --   --  BILITOT 0.7  --   --    ------------------------------------------------------------------------------------------------------------------ estimated creatinine clearance is 78.3 mL/min (by C-G formula based on Cr of  0.84). ------------------------------------------------------------------------------------------------------------------ No results for input(s): TSH, T4TOTAL, T3FREE, THYROIDAB in the last 72 hours.  Invalid input(s): FREET3  Cardiac Enzymes  Recent Labs Lab 09/15/15 0444  TROPONINI <0.03   ------------------------------------------------------------------------------------------------------------------ Invalid input(s): POCBNP ---------------------------------------------------------------------------------------------------------------  RADIOLOGY: Ct Abdomen Pelvis W Contrast  09/15/2015  CLINICAL DATA:  Abdominal pain EXAM: CT ABDOMEN AND PELVIS WITH CONTRAST TECHNIQUE: Multidetector CT imaging of the abdomen and pelvis was performed using the standard protocol following bolus administration of intravenous contrast. CONTRAST:  130mL OMNIPAQUE IOHEXOL 300 MG/ML  SOLN COMPARISON:  06/20/2005 FINDINGS: Lower chest:  No acute findings. Hepatobiliary: 8 mm mixed attenuation structure within right lobe of liver is identified, image 9 of series 2. Hyper attenuating structure within the lateral aspect of right lobe of liver measures 1 cm, image 15/series 2. Pancreas: No mass, inflammatory changes, or other significant abnormality. Spleen: Within normal limits in size and appearance. Adrenals/Urinary Tract: No masses identified. No evidence of hydronephrosis. Stomach/Bowel: No evidence of obstruction, inflammatory process, or abnormal fluid collections. The appendix is visualized and appears normal. Vascular/Lymphatic: No pathologically enlarged lymph nodes. No evidence of abdominal aortic aneurysm. Reproductive: The uterus and adnexal structures are unremarkable. Other: Trace free fluid noted within the pelvis. Along the left pericolic gutter there are several subtle areas of soft tissue stranding within the peritoneal fat posterior to the colon. For example, image number 53 of series 2 and image  number 54 of series 5. Musculoskeletal:  No suspicious bone lesions identified. IMPRESSION: 1. Trace free fluid noted within the pelvis. Additionally, several areas of nonspecific soft tissue stranding within the fat along the left pericolic gutter posterior to the colon are noted. This is favored to be postinflammatory or infectious. Differential considerations include but are not limited to epiploic appendagitis, endometriosis, or other nonspecific inflammatory process. Although felt much less likely peritoneal disease from malignancy may have a similar appearance. A followup examination in 3 months with repeat CT of the abdomen pelvis is suggested to confirm resolution. 2. No evidence for acute appendicitis. 3. There are several lesions identified within the liver, these are favored to represent benign hemangiomas. Electronically Signed   By: Kerby Moors M.D.   On: 09/15/2015 09:50   US Abdomen Limited Ruq  09/15/2015  CLINICAL DATA:  Abdominal pain for 2 days. Previous history of ectopic pregnancy. EXAM: US ABDOMEN LIMITED - RIGHT UPPER QUADRANT COMPARISON:  None. FINDINGS: Gallbladder: No gallstones or wall thickening visualized. No sonographic Murphy sign noted by sonographer. Common bile duct: Diameter: 3.2 mm, normal Liver: Circumscribed hyperechoic lesion demonstrated in the anterior right lobe of liver measuring about 1.2 cm maximal dimension. Appearance is consistent with hemangioma. No other focal liver lesions identified. Parenchymal echotexture is otherwise homogeneous. IMPRESSION: No evidence of cholelithiasis or cholecystitis. Circumscribed lesion in the right lobe of liver measuring 1.2 cm has appearance of cavernous hemangioma. Electronically Signed   By: Lucienne Capers M.D.   On: 09/15/2015 06:42    EKG:  Orders placed or performed during the hospital encounter of 09/15/15  . EKG 12-Lead  . EKG 12-Lead  . EKG 12-Lead  . EKG 12-Lead    ASSESSMENT AND PLAN:  Principal Problem:    Epiploic appendagitis  #1. Right-sided abdominal pain of unclear etiology, concerning for inflammation, continue patient on meropenem, awaiting for surgical and gastroenterology consultation #2. Gastrointestinal bleeding about a week ago,  initiate patient on PPIs, gastroenterology consultation is requested.  #3. Leukocytosis, improved with IV fluids, antibiotics, #4. Acute posthemorrhagic anemia, follow with rehydration, transfuse as needed., No acute bleeding was noted recently #5. Pyuria, questionable Urinary tract infection with urinary cultures of 50,000 colony-forming units beta hemolytic organism, continue antibiotics, blood cultures are negative so far #6. Tobacco abuse, discussed this patient for 4 minutes. Nicotine replacement therapy is recommended  Management plans discussed with the patient, family and they are in agreement.   DRUG ALLERGIES:  Allergies  Allergen Reactions  . Amoxicillin Anaphylaxis and Rash  . Ciprofloxacin Shortness Of Breath and Itching  . Penicillins Anaphylaxis and Rash    Has patient had a PCN reaction causing immediate rash, facial/tongue/throat swelling, SOB or lightheadedness with hypotension: yes: Has patient had a PCN reaction causing severe rash involving mucus membranes or skin necrosis: no Has patient had a PCN reaction that required hospitalization no Has patient had a PCN reaction occurring within the last 10 years: no1} If all of the above answers are "NO", then may proceed with Cephalosporin use.     CODE STATUS:     Code Status Orders        Start     Ordered   09/15/15 1241  Full code   Continuous     09/15/15 1240    Code Status History    Date Active Date Inactive Code Status Order ID Comments User Context   This patient has a current code status but no historical code status.      TOTAL TIME TAKING CARE OF THIS PATIENT: 35 minutes.    Theodoro Grist M.D on 09/16/2015 at 2:27 PM  Between 7am to 6pm - Pager -  [phone]  After 6pm go to [email or web address] - password [email or web address]  [facility]  Office  [phone]  CC: Primary care physician; No primary care provider on file.

## 2015-09-16 NOTE — Progress Notes (Signed)
Initial Nutrition Assessment   INTERVENTION:   Coordination of Care: await diet progression as medically able   NUTRITION DIAGNOSIS:   Inadequate oral intake related to acute illness as evidenced by per patient/family report.  GOAL:   Patient will meet greater than or equal to 90% of their needs  MONITOR:   PO intake, Diet advancement, Labs, Weight trends, I & O's  REASON FOR ASSESSMENT:   Malnutrition Screening Tool    ASSESSMENT:    Pt admitted with abdominal pain possibly secondary to epiploic appendagitis. Gi consult pending.  Past Medical History  Diagnosis Date  . Asthma   . Ectopic pregnancy     x 2     Diet Order:  Diet clear liquid Room service appropriate?: Yes; Fluid consistency:: Thin    Current Nutrition: Pt reports tolerating CL diet order this am. Pt asking for solid foods.  Food/Nutrition-Related History: Pt reports poor po intake for a few days PTA.   Scheduled Medications:  . enoxaparin (LOVENOX) injection  40 mg Subcutaneous Q24H  . ibuprofen  400 mg Oral 3 times per day  . meropenem (MERREM) IV  1 g Intravenous 3 times per day    Continuous Medications:  . 0.9 % NaCl with KCl 20 mEq / L 100 mL/hr at 09/16/15 1125     Electrolyte/Renal Profile and Glucose Profile:   Recent Labs Lab 09/15/15 0444 09/15/15 1324 09/16/15 0436  NA 141  --  142  K 3.5  --  3.7  CL 110  --  112*  CO2 24  --  24  BUN 6  --  5*  CREATININE 0.85  --  0.84  CALCIUM 8.8*  --  8.1*  MG  --  1.9  --   GLUCOSE 80  --  97   Protein Profile:  Recent Labs Lab 09/15/15 0444  ALBUMIN 3.6    Gastrointestinal Profile: Last BM:  09/14/2015   Nutrition-Focused Physical Exam Findings:  Unable to complete Nutrition-Focused physical exam at this time.    Weight Change: Pt reports weight loss of 3lbs in the past 4-5 days as she reports weighing 136lbs. Per [facility] weight loss of 10% weight loss in 3 months.    Height:   Ht Readings from Last 1  Encounters:  09/15/15 4\' 11"  (1.499 m)    Weight:   Wt Readings from Last 1 Encounters:  09/15/15 133 lb 8 oz (60.555 kg)   Wt Readings from Last 10 Encounters:  09/15/15 133 lb 8 oz (60.555 kg)  06/15/15 150 lb (68.04 kg)  05/31/15 150 lb (68.04 kg)  02/28/15 147 lb (66.679 kg)     BMI:  Body mass index is 26.95 kg/(m^2).   EDUCATION NEEDS:   No education needs identified at this time  [place], RD, LDN Pager [phone] Weekend/On-Call Pager [phone]

## 2015-09-17 DIAGNOSIS — D72829 Elevated white blood cell count, unspecified: Secondary | ICD-10-CM

## 2015-09-17 DIAGNOSIS — D649 Anemia, unspecified: Secondary | ICD-10-CM

## 2015-09-17 DIAGNOSIS — N739 Female pelvic inflammatory disease, unspecified: Secondary | ICD-10-CM

## 2015-09-17 DIAGNOSIS — Z720 Tobacco use: Secondary | ICD-10-CM

## 2015-09-17 DIAGNOSIS — Z8719 Personal history of other diseases of the digestive system: Secondary | ICD-10-CM

## 2015-09-17 DIAGNOSIS — R8281 Pyuria: Secondary | ICD-10-CM

## 2015-09-17 LAB — CBC
HEMATOCRIT: 33.5 % — AB (ref 35.0–47.0)
Hemoglobin: 11.1 g/dL — ABNORMAL LOW (ref 12.0–16.0)
MCH: 31 pg (ref 26.0–34.0)
MCHC: 33.1 g/dL (ref 32.0–36.0)
MCV: 93.8 fL (ref 80.0–100.0)
PLATELETS: 251 10*3/uL (ref 150–440)
RBC: 3.57 MIL/uL — ABNORMAL LOW (ref 3.80–5.20)
RDW: 12.6 % (ref 11.5–14.5)
WBC: 5.9 10*3/uL (ref 3.6–11.0)

## 2015-09-17 LAB — URINE CULTURE
Culture: 50000
SPECIAL REQUESTS: NORMAL

## 2015-09-17 LAB — RAPID HIV SCREEN (HIV 1/2 AB+AG)
HIV 1/2 ANTIBODIES: NONREACTIVE
HIV-1 P24 Antigen - HIV24: NONREACTIVE

## 2015-09-17 LAB — CHLAMYDIA/NGC RT PCR (ARMC ONLY)
Chlamydia Tr: NOT DETECTED
N gonorrhoeae: NOT DETECTED

## 2015-09-17 MED ORDER — DOXYCYCLINE HYCLATE 100 MG PO TABS: 100.0000 mg | ORAL_TABLET | Freq: Two times a day (BID) | ORAL | Status: DC

## 2015-09-17 MED ORDER — NICOTINE POLACRILEX 2 MG MT GUM: 2.0000 mg | CHEWING_GUM | OROMUCOSAL | Status: DC | PRN

## 2015-09-17 MED ORDER — DOCUSATE SODIUM 100 MG PO CAPS: 100.0000 mg | ORAL_CAPSULE | Freq: Two times a day (BID) | ORAL | Status: DC

## 2015-09-17 MED ORDER — HYDROCODONE-ACETAMINOPHEN 5-325 MG PO TABS: 1.0000 | ORAL_TABLET | ORAL | Status: DC | PRN

## 2015-09-17 MED ORDER — SENNA 8.6 MG PO TABS: 1.0000 | ORAL_TABLET | Freq: Every day | ORAL | Status: DC

## 2015-09-17 MED ORDER — DOXYCYCLINE HYCLATE 100 MG PO TABS
100.0000 mg | ORAL_TABLET | Freq: Two times a day (BID) | ORAL | Status: DC
  Administered 2015-09-17: 100 mg via ORAL
  Filled 2015-09-17: qty 1

## 2015-09-17 MED ORDER — ALBUTEROL SULFATE (2.5 MG/3ML) 0.083% IN NEBU: 3.0000 mL | INHALATION_SOLUTION | RESPIRATORY_TRACT | Status: DC | PRN

## 2015-09-17 MED ORDER — METRONIDAZOLE 500 MG PO TABS: 500.0000 mg | ORAL_TABLET | Freq: Three times a day (TID) | ORAL | Status: DC

## 2015-09-17 NOTE — Consult Note (Signed)
GI Inpatient Consult Note  Reason for Consult: Abdominal pain   Attending Requesting Consult: Dr. Ether Griffins  History of Present Illness: Nicole Gross is a 30 y.o. female with a history of asthma admitted with suspected epiploic appendagitis.  Patient states she began experiencing sharp, severe, constant epigastric and RUQ pain over the last 3 days.  She experienced similar pain in the past, but it occurred intermittently and was not as intense.  When the severity reached a 10/10, she presented to the [facility] ED.    Labs: WBCs 14.5 RUQ [place]: no evidence of cholecystitis/cholelithiais, 1.2cm circumscribed lesion in R lobe of liver CT a/p - trace free fluid, several areas onf nonspecific soft tissue stranding w/in fat along the L pericolic gutter, posterior to colon  Patient was started on IV Meropenem and admitted for observation.  Today, she reports her abdominal pain is almost completely resolved.  She denies nausea, vomiting, or other GI complaints today.  She had a small BM this morning w/o hematochezia or melena; patient does note one isolated incidence of hematochezia with a loose BM last week.  She has never experienced this before, and has not experienced it since.  She denies FHx of IBD, celiac, or CCA.  Patient has no additional GI complaints today including fevers, chills, unexplained weight loss, appetite changes, GERD symptoms, or change in bowel habits.  Notably, WBCs improved this morning at 5.9.  Past Medical History:  Past Medical History  Diagnosis Date  . Asthma   . Ectopic pregnancy     x 2    Problem List: Patient Active Problem List   Diagnosis Date Noted  . Epiploic appendagitis 09/15/2015    Past Surgical History: Past Surgical History  Procedure Laterality Date  . Ectopic pregnancy surgery      Allergies: Allergies  Allergen Reactions  . Amoxicillin Anaphylaxis and Rash  . Ciprofloxacin Shortness Of Breath and Itching  . Penicillins Anaphylaxis and Rash   Has patient had a PCN reaction causing immediate rash, facial/tongue/throat swelling, SOB or lightheadedness with hypotension: yes: Has patient had a PCN reaction causing severe rash involving mucus membranes or skin necrosis: no Has patient had a PCN reaction that required hospitalization no Has patient had a PCN reaction occurring within the last 10 years: no1} If all of the above answers are "NO", then may proceed with Cephalosporin use.     Home Medications: No prescriptions prior to admission   Home medication reconciliation was completed with the patient.   Scheduled Inpatient Medications:   . docusate sodium  100 mg Oral BID  . enoxaparin (LOVENOX) injection  40 mg Subcutaneous Q24H  . meropenem (MERREM) IV  1 g Intravenous 3 times per day  . senna  1 tablet Oral Daily    Continuous Inpatient Infusions:     PRN Inpatient Medications:  acetaminophen **OR** acetaminophen, albuterol, HYDROcodone-acetaminophen, morphine injection, nicotine polacrilex, ondansetron **OR** ondansetron (ZOFRAN) IV  Family History: family history includes Diabetes in her father and mother; Hypertension in her father and mother.  The patient's family history is negative for inflammatory bowel disorders, GI malignancy, or solid organ transplantation.  Social History:   reports that she has been smoking Cigarettes.  She has a 5 pack-year smoking history. She does not have any smokeless tobacco history on file. She reports that she does not drink alcohol or use illicit drugs. The patient denies ETOH, tobacco, or drug use.   Review of Systems: Constitutional: Weight is stable.  Eyes: No changes in vision.  ENT: No oral lesions, sore throat.  GI: see HPI.  Heme/Lymph: No easy bruising.  CV: No chest pain.  GU: No hematuria.  Integumentary: No rashes.  Neuro: No headaches.  Psych: No depression/anxiety.  Endocrine: No heat/cold intolerance.  Allergic/Immunologic: No urticaria.  Resp: No cough, SOB.   Musculoskeletal: No joint swelling.    Physical Examination: BP 114/77 mmHg  Pulse 79  Temp(Src) 98.4 F (36.9 C) (Oral)  Resp 18  Ht 4\' 11"  (1.499 m)  Wt 60.555 kg (133 lb 8 oz)  BMI 26.95 kg/m2  SpO2 100%  LMP 09/09/2015 Gen: NAD, alert and oriented x 4 HEENT: PEERLA, EOMI, Neck: supple, no JVD or thyromegaly Chest: CTA bilaterally, no wheezes, crackles, or other adventitious sounds CV: RRR, no m/g/c/r Abd: soft, NT, ND, +BS in all four quadrants; no HSM, guarding, ridigity, or rebound tenderness Ext: no edema, well perfused with 2+ pulses, Skin: no rash or lesions noted Lymph: no LAD  Data: Lab Results  Component Value Date   WBC 5.9 09/17/2015   HGB 11.1* 09/17/2015   HCT 33.5* 09/17/2015   MCV 93.8 09/17/2015   PLT 251 09/17/2015    Recent Labs Lab 09/15/15 0444 09/16/15 0436 09/17/15 0413  HGB 12.6 11.0* 11.1*   Lab Results  Component Value Date   NA 142 09/16/2015   K 3.7 09/16/2015   CL 112* 09/16/2015   CO2 24 09/16/2015   BUN 5* 09/16/2015   CREATININE 0.84 09/16/2015   Lab Results  Component Value Date   ALT 12* 09/15/2015   AST 18 09/15/2015   ALKPHOS 58 09/15/2015   BILITOT 0.7 09/15/2015   No results for input(s): APTT, INR, PTT in the last 168 hours.   Assessment/Plan: [name] is a 30 y.o. female with a history of asthma admitted with suspected epiploic appendagitis. Patient symptomatically improved this morning with no further abdominal pain or other GI complaints.  Regarding rectal bleeding, likely secondary to an internal hemorrhoid or small fissure irritated during the episode of loose, frequent stool.  Patient reports this has resolved w/o recurrence.  Rectal bleeding unlikely related to inflammatory changes in L pericolic gutter - suspect infectious etiology given prompt response to abx.  I encouraged patient to call [facility] GI as outpatient if abdominal pain or rectal bleeding return.  Recommendations: - Abdominal pain improved,  rectal bleeding resolved w/o recurrence - no further evaluation indicated at this time  Thank you for the consult. We will follow along with you. Please call with questions or concerns.  Lavera Guise, PA-C [facility] Phone: [phone] Pager: [phone]

## 2015-09-17 NOTE — Progress Notes (Signed)
30 yr old with pelvic pain and likely PID with concern of epiploic appendagitis.  Patient states green discharge from vagina. States somewhat improved. Some indigestion pain as well.   Filed Vitals:   09/17/15 0512 09/17/15 1246  BP: 114/77 114/57  Pulse: 79 82  Temp: 98.4 F (36.9 C)   Resp: 18 20   I/O last 3 completed shifts: In: -  Out: 1 [Urine:1] Total I/O In: 0  Out: 1 [Urine:1]   PE:  Gen: NAD Res; CTAB/L  Cardio: RRR Abd: soft, tender in suprapubic area, no LLQ tenderness Ext: 2+ pulses, no edema  CBC Latest Ref Rng 09/17/2015 09/16/2015 09/15/2015  WBC 3.6 - 11.0 K/uL 5.9 12.1(H) 14.5(H)  Hemoglobin 12.0 - 16.0 g/dL 11.1(L) 11.0(L) 12.6  Hematocrit 35.0 - 47.0 % 33.5(L) 33.2(L) 36.5  Platelets 150 - 440 K/uL 251 255 317   CMP Latest Ref Rng 09/16/2015 09/15/2015 08/13/2013  Glucose 65 - 99 mg/dL 97 80 77  BUN 6 - 20 mg/dL 5(L) 6 13  Creatinine 0.44 - 1.00 mg/dL 0.84 0.85 0.86  Sodium 135 - 145 mmol/L 142 141 137  Potassium 3.5 - 5.1 mmol/L 3.7 3.5 3.6  Chloride 101 - 111 mmol/L 112(H) 110 107  CO2 22 - 32 mmol/L 24 24 28   Calcium 8.9 - 10.3 mg/dL 8.1(L) 8.8(L) 8.7  Total Protein 6.5 - 8.1 g/dL - 6.6 7.4  Total Bilirubin 0.3 - 1.2 mg/dL - 0.7 0.1(L)  Alkaline Phos 38 - 126 U/L - 58 81  AST 15 - 41 U/L - 18 23  ALT 14 - 54 U/L - 12(L) 18    A/P: 30 yr old with PID and concern for an epiploic appendagitis which was not very convincing on the CT scan and likely is reactive to the PID.  Would recommend PPI for indigestion and continue Antibiotics.

## 2015-09-17 NOTE — Progress Notes (Signed)
Pharmacy Antibiotic Note  Nicole Gross is a 30 y.o. female admitted on 09/15/2015 with UTI and intra-abdominal infection.  Pharmacy has been consulted for Meropenem dosing.  Plan: Patient has allergies to amoxicillin and ciprofloxacin that include anaphylaxis/SOB and rash. Will continue with meropenem 1 gm 3 times daily for intraabdominal infection.   Height: 4\' 11"  (149.9 cm) Weight: 133 lb 8 oz (60.555 kg) IBW/kg (Calculated) : 43.2  Temp (24hrs), Avg:98.5 F (36.9 C), Min:98 F (36.7 C), Max:99.1 F (37.3 C)   Recent Labs Lab 09/15/15 0444 09/16/15 0436 09/17/15 0413  WBC 14.5* 12.1* 5.9  CREATININE 0.85 0.84  --     Estimated Creatinine Clearance: 78.3 mL/min (by C-G formula based on Cr of 0.84).    Allergies  Allergen Reactions  . Amoxicillin Anaphylaxis and Rash  . Ciprofloxacin Shortness Of Breath and Itching  . Penicillins Anaphylaxis and Rash    Has patient had a PCN reaction causing immediate rash, facial/tongue/throat swelling, SOB or lightheadedness with hypotension: yes: Has patient had a PCN reaction causing severe rash involving mucus membranes or skin necrosis: no Has patient had a PCN reaction that required hospitalization no Has patient had a PCN reaction occurring within the last 10 years: no1} If all of the above answers are "NO", then may proceed with Cephalosporin use.     Antimicrobials this admission: Meropenem 3/8 >>  Microbiology results: 3/8 BCx: NG x 1 day 3/8 UCx: 50,000 group B strep  Thank you for allowing pharmacy to be a part of this patient's care.  Dani Gobble Amel Gianino 09/17/2015 9:33 AM

## 2015-09-17 NOTE — Discharge Summary (Signed)
[facility] [place] NAME: Nicole Gross    MR#:  [id number]  DATE OF BIRTH:  Mar 01, 1986  DATE OF ADMISSION:  09/15/2015 ADMITTING PHYSICIAN: Demetrios Loll, MD  DATE OF DISCHARGE: No discharge date for patient encounter.  PRIMARY CARE PHYSICIAN: No primary care provider on file.     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 Asthma   . Ectopic pregnancy     x 2    .pro HOSPITAL COURSE:   Patient is 30 year old African-American female with past medical history significant history of asthma, ectopic pregnancy 2 who presents to the hospital with complaints of lower abdominal pain, right lower quadrant suprapubic area, dysuria, increasing frequency of urination, admitted of bloody stool about a week ago. Patient had CT scan of the abdomen and pelvis which revealed trace of free fluid within the bladder. Pelvis, soft tissue stranding in posterior colon area, nonspecific inflammatory process was suspected. Patient was seen by GYN, surgery, infectious disease specialist, who felt that patient very likely has pelvic inflammatory disease. Patient was initiated on intravenous broad-spectrum antibiotics initially and improved clinically, she was changed to doxycycline and Flagyl on the day of discharge home. She was advised to follow-up with GYN. Discussion by problem #1. Right-sided abdominal pain of unclear etiology, concerning for pelvic inflammatory disease, appreciate surgery, GYN, infectious disease specialist input, continue patient on doxycycline and Flagyl orally for 2 weeks, follow-up with GYN as outpatient. The chlamydia and gonococcus PCR was taken,  pending #2. Gastrointestinal bleeding about a week ago, continue patient on PPIs, gastroenterology consultation as outpatient is requested  #3. Leukocytosis, resolved with IV fluids, antibiotics #4. Acute posthemorrhagic anemia, stable with rehydration, no transfusion was needed , No acute gastrointestinal bleeding was noted , although patient developed menstrual bleeding today #5. Pyuria, unlikely Urinary tract infection as urinary cultures revealed 50,000 colony-forming units . Group B streptococcus,, blood cultures were negative so far #6. Tobacco abuse, discussed this patient for 4 minutes. Nicotine replacement therapy is recommended  DISCHARGE CONDITIONS:   Stable  CONSULTS OBTAINED:  Treatment Team:  Clayburn Pert, MD Boykin Nearing, MD Josefine Class, MD Adrian Prows, MD  DRUG ALLERGIES:   Allergies  Allergen Reactions  . Amoxicillin Anaphylaxis and Rash  . Ciprofloxacin Shortness Of Breath and Itching  . Penicillins Anaphylaxis and Rash    Has patient had a PCN reaction causing immediate rash, facial/tongue/throat swelling, SOB or lightheadedness with hypotension: yes: Has patient had a PCN reaction causing severe rash involving mucus membranes or skin necrosis: no Has patient had a PCN reaction that required hospitalization no Has patient had a PCN reaction occurring within the last 10 years: no1} If all of the above answers are "NO", then may proceed with Cephalosporin use.     DISCHARGE MEDICATIONS:   Current Discharge Medication List    START taking these medications   Details  docusate sodium (COLACE) 100 MG capsule Take 1 capsule (100 mg total) by mouth 2 (two) times daily. Qty: 10 capsule, Refills: 0    doxycycline (VIBRA-TABS) 100 MG tablet Take 1 tablet (100 mg total) by mouth every 12 (twelve) hours. Qty: 28 tablet, Refills: 0  HYDROcodone-acetaminophen (NORCO/VICODIN) 5-325 MG tablet Take 1-2 tablets by mouth every 4 (four) hours as  needed for moderate pain. Qty: 30 tablet, Refills: 0    metroNIDAZOLE (FLAGYL) 500 MG tablet Take 1 tablet (500 mg total) by mouth 3 (three) times daily. Qty: 28 tablet, Refills: 0    nicotine polacrilex (NICORETTE) 2 MG gum Take 1 each (2 mg total) by mouth as needed for smoking cessation. Qty: 100 tablet, Refills: 0    senna (SENOKOT) 8.6 MG TABS tablet Take 1 tablet (8.6 mg total) by mouth daily. Qty: 120 each, Refills: 0         DISCHARGE INSTRUCTIONS:    Patient is to follow-up with primary care physician, GYN, gastroenterologist as outpatient  If you experience worsening of your admission symptoms, develop shortness of breath, life threatening emergency, suicidal or homicidal thoughts you must seek medical attention immediately by calling 911 or calling your MD immediately  if symptoms less severe.  You Must read complete instructions/literature along with all the possible adverse reactions/side effects for all the Medicines you take and that have been prescribed to you. Take any new Medicines after you have completely understood and accept all the possible adverse reactions/side effects.   Please note  You were cared for by a hospitalist during your hospital stay. If you have any questions about your discharge medications or the care you received while you were in the hospital after you are discharged, you can call the unit and asked to speak with the hospitalist on call if the hospitalist that took care of you is not available. Once you are discharged, your primary care physician will handle any further medical issues. Please note that NO REFILLS for any discharge medications will be authorized once you are discharged, as it is imperative that you return to your primary care physician (or establish a relationship with a primary care physician if you do not have one) for your aftercare needs so that they can reassess your need for medications and monitor your lab  values.    Today   CHIEF COMPLAINT:   Chief Complaint  Patient presents with  . Abdominal Pain    HISTORY OF PRESENT ILLNESS:  Nicole Gross  is a 30 y.o. female with a known history of asthma, ectopic pregnancy 2 who presents to the hospital with complaints of lower abdominal pain, right lower quadrant suprapubic area, dysuria, increasing frequency of urination, admitted of bloody stool about a week ago. Patient had CT scan of the abdomen and pelvis which revealed trace of free fluid within the bladder. Pelvis, soft tissue stranding in posterior colon area, nonspecific inflammatory process was suspected. Patient was seen by GYN, surgery, infectious disease specialist, who felt that patient very likely has pelvic inflammatory disease. Patient was initiated on intravenous broad-spectrum antibiotics initially and improved clinically, she was changed to doxycycline and Flagyl on the day of discharge home. She was advised to follow-up with GYN. Discussion by problem #1. Right-sided abdominal pain of unclear etiology, concerning for pelvic inflammatory disease, appreciate surgery, GYN, infectious disease specialist input, continue patient on doxycycline and Flagyl orally for 2 weeks, follow-up with GYN as outpatient. The chlamydia and gonococcus PCR was taken, pending #2. Gastrointestinal bleeding about a week ago, continue patient on PPIs, gastroenterology consultation as outpatient is requested  #3. Leukocytosis, resolved with IV fluids, antibiotics #4. Acute posthemorrhagic anemia, stable with rehydration, no transfusion was needed , No acute gastrointestinal bleeding was noted , although patient developed menstrual bleeding today #5.  Pyuria, unlikely Urinary tract infection as urinary cultures revealed 50,000 colony-forming units . Group B streptococcus,, blood cultures were negative so far #6. Tobacco abuse, discussed this patient for 4 minutes. Nicotine replacement therapy is  recommended   VITAL SIGNS:  Blood pressure 114/57, pulse 82, temperature 98.4 F (36.9 C), temperature source Oral, resp. rate 20, height 4\' 11"  (1.499 m), weight 60.555 kg (133 lb 8 oz), last menstrual period 09/09/2015, SpO2 98 %.  I/O:   Intake/Output Summary (Last 24 hours) at 09/17/15 1529 Last data filed at 09/17/15 1051  Gross per 24 hour  Intake      0 ml  Output      2 ml  Net     -2 ml    PHYSICAL EXAMINATION:  GENERAL:  30 y.o.-year-old patient lying in the bed with no acute distress.  EYES: Pupils equal, round, reactive to light and accommodation. No scleral icterus. Extraocular muscles intact.  HEENT: Head atraumatic, normocephalic. Oropharynx and nasopharynx clear.  NECK:  Supple, no jugular venous distention. No thyroid enlargement, no tenderness.  LUNGS: Normal breath sounds bilaterally, no wheezing, rales,rhonchi or crepitation. No use of accessory muscles of respiration.  CARDIOVASCULAR: S1, S2 normal. No murmurs, rubs, or gallops.  ABDOMEN: Soft, non-tender, non-distended. Bowel sounds present. No organomegaly or mass.  EXTREMITIES: No pedal edema, cyanosis, or clubbing.  NEUROLOGIC: Cranial nerves II through XII are intact. Muscle strength 5/5 in all extremities. Sensation intact. Gait not checked.  PSYCHIATRIC: The patient is alert and oriented x 3.  SKIN: No obvious rash, lesion, or ulcer.   DATA REVIEW:   CBC  Recent Labs Lab 09/17/15 0413  WBC 5.9  HGB 11.1*  HCT 33.5*  PLT 251    Chemistries   Recent Labs Lab 09/15/15 0444 09/15/15 1324 09/16/15 0436  NA 141  --  142  K 3.5  --  3.7  CL 110  --  112*  CO2 24  --  24  GLUCOSE 80  --  97  BUN 6  --  5*  CREATININE 0.85  --  0.84  CALCIUM 8.8*  --  8.1*  MG  --  1.9  --   AST 18  --   --   ALT 12*  --   --   ALKPHOS 58  --   --   BILITOT 0.7  --   --     Cardiac Enzymes  Recent Labs Lab 09/15/15 0444  TROPONINI <0.03    Microbiology Results  Results for orders placed or  performed during the hospital encounter of 09/15/15  Urine culture     Status: None   Collection Time: 09/15/15  8:15 AM  Result Value Ref Range Status   Specimen Description URINE, CLEAN CATCH  Final   Special Requests Normal  Final   Culture   Final    50,000 COLONIES/mL GROUP B STREP(S.AGALACTIAE)ISOLATED There is no known Penicillin Resistant Beta Streptococcus in the [place] For patients that are Penicillin-allergic, Erythromycin is 85-94% susceptible, and Clindamycin is 80% susceptible.  Contact Microbiology within 7 days if sensitivity testing is  required.   Virtually 100% of S. agalactiae (Group B) strains are susceptible to Penicillin.  For Penicillin-allergic patients, Erythromycin (85-95% sensitive) and Clindamycin (80% sensitive) are drugs of choice. Contact microbiology lab to request sensitivities if  needed within 7 days.    Report Status 09/17/2015 FINAL  Final  CULTURE, BLOOD (ROUTINE X 2) w Reflex to PCR ID Panel     Status:  None (Preliminary result)   Collection Time: 09/15/15  4:54 PM  Result Value Ref Range Status   Specimen Description BLOOD RIGHT ANTECUBITAL  Final   Special Requests BOTTLES DRAWN AEROBIC AND ANAEROBIC 10ML  Final   Culture NO GROWTH < 24 HOURS  Final   Report Status PENDING  Incomplete  CULTURE, BLOOD (ROUTINE X 2) w Reflex to PCR ID Panel     Status: None (Preliminary result)   Collection Time: 09/15/15  4:58 PM  Result Value Ref Range Status   Specimen Description BLOOD  Final   Special Requests NONE  Final   Culture NO GROWTH < 24 HOURS  Final   Report Status PENDING  Incomplete  Wet prep, genital     Status: Abnormal   Collection Time: 09/16/15  9:27 PM  Result Value Ref Range Status   Yeast Wet Prep HPF POC NONE SEEN NONE SEEN Final   Trich, Wet Prep NONE SEEN NONE SEEN Final   Clue Cells Wet Prep HPF POC NONE SEEN NONE SEEN Final   WBC, Wet Prep HPF POC MANY (A) NONE SEEN Final   Sperm NONE SEEN  Final    RADIOLOGY:  No results  found.  EKG:   Orders placed or performed during the hospital encounter of 09/15/15  . EKG 12-Lead  . EKG 12-Lead  . EKG 12-Lead  . EKG 12-Lead      Management plans discussed with the patient, family and they are in agreement.  CODE STATUS:     Code Status Orders        Start     Ordered   09/15/15 1241  Full code   Continuous     09/15/15 1240    Code Status History    Date Active Date Inactive Code Status Order ID Comments User Context   This patient has a current code status but no historical code status.      TOTAL TIME TAKING CARE OF THIS PATIENT: 40 minutes.    Theodoro Grist M.D on 09/17/2015 at 3:29 PM  Between 7am to 6pm - Pager - [phone]  After 6pm go to [email or web address] - password [email or web address] [facility]  [facility]  Office  [phone]  CC: Primary care physician; No primary care provider on file.

## 2015-09-17 NOTE — Consult Note (Signed)
Consult History and Physical   SERVICE: Gynecology   Patient Name: Nicole Gross Patient MRN:   [id number]  CC: Abdominal pain  HPI: Nicole Gross is a 30 y.o. is admitted for abdominal pain.  We were consulted today by Dr. Ether Griffins to evaluate abnormal green discharge.  She is currently being treated with Meropenem IV antibiotics for intraabdominal infection and GBS bacteriuria.  She reports having a "green discharge with wiping" yesterday, but has not had any discharge today and reports starting her period again today.  She reports a history of irregular menses, with a LMP of 09/09/15 lasting until 09/14/15, and started having some light bleeding today.  She is currently sexually active, and uses condoms for contraception.  She reports [facility]. She has had a history of multiple sexually transmitted infections including gonorrhea and chlamydia.  She has had 2 ectopic pregnancies requiring surgical intervention, on the right and left side.  Pregnancy test on 3/8 was negative and CT of abdomen and pelvis was unremarkable for uterine and adnexal structures.       Review of Systems: positives in bold GEN:  No fevers, chills, weight changes, appetite changes, fatigue, night sweats HEENT: No HA, vision changes, hearing loss, congestion, rhinorrhea, sinus pressure, dysphagia CV:  No CP, palpitations PULM:  No SOB, cough GI:  No N/V/D/C, abdominal pain is improving  GU:  No dysuria, urgency, frequency, +vaginal bleeding MSK:  No arthralgias, myalgias, back pain, swelling SKIN:  No rashes, color changes, pallor NEURO:  No numbness, weakness, tingling, seizures, dizziness, tremors PSYCH:  No depression, anxiety, behavioral problems, confusion  HEME/LYMPH: No easy bruising or bleeding ENDO: No heat/cold intolerance  Past Obstetrical History: OB History    No data available      Past Gynecologic History: Patient's last menstrual period was 09/09/2015.  Menstrual frequency: pt. Reports irregular menstrual cycles, not on birth control/ LMP: 09/09/15-09/14/15 normal flow  Past Medical History: Past Medical History  Diagnosis Date  . Asthma   . Ectopic pregnancy     x 2    Past Surgical History:   Past Surgical History  Procedure Laterality Date  . Ectopic pregnancy surgery      Family History:  family history includes Diabetes in her father and mother; Hypertension in her father and mother.  Social History:  Social History   Social History  . Marital Status: Single    Spouse Name: N/A  . Number of Children: N/A  . Years of Education: N/A   Occupational History  . Not on file.   Social History Main Topics  . Smoking status: Current Every Day Smoker -- 0.50 packs/day for 10 years    Types: Cigarettes  . Smokeless tobacco: Not on file  . Alcohol Use: No  . Drug Use: No  . Sexual Activity: Not on file   Other Topics Concern  . Not on file   Social History Narrative    Home Medications:  Medications reconciled in EPIC  No current facility-administered medications on file prior to encounter.   No current outpatient prescriptions on file prior to encounter.    Allergies:  Allergies  Allergen Reactions  . Amoxicillin Anaphylaxis and Rash  . Ciprofloxacin Shortness Of Breath and Itching  . Penicillins Anaphylaxis and Rash    Has patient had a PCN reaction causing immediate rash, facial/tongue/throat swelling, SOB or lightheadedness with hypotension: yes: Has patient had a PCN reaction causing severe rash involving mucus membranes or skin necrosis:  no Has patient had a PCN reaction that required hospitalization no Has patient had a PCN reaction occurring within the last 10 years: no1} If all of the above answers are "NO", then may proceed with Cephalosporin use.     Physical Exam:  Temp:  [98 F (36.7 C)-99.1 F (37.3 C)] 98.4 F (36.9 C) (03/10 0512) Pulse Rate:  [70-79] 79 (03/10 0512) Resp:  [18-20] 18  (03/10 0512) BP: (91-114)/(52-77) 114/77 mmHg (03/10 0512) SpO2:  [100 %] 100 % (03/10 0512)   General Appearance:  Well developed, well nourished, no acute distress, alert and oriented x3 HEENT:  Normocephalic atraumatic, extraocular movements intact, moist mucous membranes Cardiovascular:  Normal S1/S2, regular rate and rhythm, no murmurs Pulmonary:  clear to auscultation, no wheezes, rales or rhonchi, symmetric air entry, good air exchange Abdomen:  Bowel sounds present, soft, nontender, nondistended, no abnormal masses, no epigastric pain Extremities:  Full range of motion, no pedal edema, 2+ distal pulses, no tenderness Skin:  normal coloration and turgor, no rashes, no suspicious skin lesions noted  Neurologic:  Cranial nerves 2-12 grossly intact, normal muscle tone, strength 5/5 all four extremities Psychiatric:  Normal mood and affect, appropriate, no AH/VH Pelvic:  NEFG, no vulvar masses or lesions, normal vaginal mucosa, + vaginal bleeding,  no abnormal discharge noted, no odor noted, cervix without lesions or erythema, non-tender uterus, no adnexal masses appreciated, +right adnexal tenderness, no left adnexal tenderness, +CMT, no pelvic organ prolapse  Labs/Studies:   CBC and Coags:  Lab Results  Component Value Date   WBC 5.9 09/17/2015   NEUTOPHILPCT 58.5 08/13/2013   EOSPCT 2.1 08/13/2013   BASOPCT 1.2 08/13/2013   LYMPHOPCT 33.1 08/13/2013   HGB 11.1* 09/17/2015   HCT 33.5* 09/17/2015   MCV 93.8 09/17/2015   PLT 251 09/17/2015   CMP:  Lab Results  Component Value Date   NA 142 09/16/2015   K 3.7 09/16/2015   CL 112* 09/16/2015   CO2 24 09/16/2015   BUN 5* 09/16/2015   CREATININE 0.84 09/16/2015   CREATININE 0.85 09/15/2015   CREATININE 0.86 08/13/2013   PROT 6.6 09/15/2015   BILITOT 0.7 09/15/2015   ALT 12* 09/15/2015   AST 18 09/15/2015   ALKPHOS 58 09/15/2015   Other Labs: Other Imaging: Ct Abdomen Pelvis W Contrast  09/15/2015  CLINICAL DATA:   Abdominal pain EXAM: CT ABDOMEN AND PELVIS WITH CONTRAST TECHNIQUE: Multidetector CT imaging of the abdomen and pelvis was performed using the standard protocol following bolus administration of intravenous contrast. CONTRAST:  114mL OMNIPAQUE IOHEXOL 300 MG/ML  SOLN COMPARISON:  06/20/2005 FINDINGS: Lower chest:  No acute findings. Hepatobiliary: 8 mm mixed attenuation structure within right lobe of liver is identified, image 9 of series 2. Hyper attenuating structure within the lateral aspect of right lobe of liver measures 1 cm, image 15/series 2. Pancreas: No mass, inflammatory changes, or other significant abnormality. Spleen: Within normal limits in size and appearance. Adrenals/Urinary Tract: No masses identified. No evidence of hydronephrosis. Stomach/Bowel: No evidence of obstruction, inflammatory process, or abnormal fluid collections. The appendix is visualized and appears normal. Vascular/Lymphatic: No pathologically enlarged lymph nodes. No evidence of abdominal aortic aneurysm. Reproductive: The uterus and adnexal structures are unremarkable. Other: Trace free fluid noted within the pelvis. Along the left pericolic gutter there are several subtle areas of soft tissue stranding within the peritoneal fat posterior to the colon. For example, image number 53 of series 2 and image number 54 of series 5. Musculoskeletal:  No  suspicious bone lesions identified. IMPRESSION: 1. Trace free fluid noted within the pelvis. Additionally, several areas of nonspecific soft tissue stranding within the fat along the left pericolic gutter posterior to the colon are noted. This is favored to be postinflammatory or infectious. Differential considerations include but are not limited to epiploic appendagitis, endometriosis, or other nonspecific inflammatory process. Although felt much less likely peritoneal disease from malignancy may have a similar appearance. A followup examination in 3 months with repeat CT of the abdomen  pelvis is suggested to confirm resolution. 2. No evidence for acute appendicitis. 3. There are several lesions identified within the liver, these are favored to represent benign hemangiomas. Electronically Signed   By: Kerby Moors M.D.   On: 09/15/2015 09:50   US Abdomen Limited Ruq  09/15/2015  CLINICAL DATA:  Abdominal pain for 2 days. Previous history of ectopic pregnancy. EXAM: US ABDOMEN LIMITED - RIGHT UPPER QUADRANT COMPARISON:  None. FINDINGS: Gallbladder: No gallstones or wall thickening visualized. No sonographic Murphy sign noted by sonographer. Common bile duct: Diameter: 3.2 mm, normal Liver: Circumscribed hyperechoic lesion demonstrated in the anterior right lobe of liver measuring about 1.2 cm maximal dimension. Appearance is consistent with hemangioma. No other focal liver lesions identified. Parenchymal echotexture is otherwise homogeneous. IMPRESSION: No evidence of cholelithiasis or cholecystitis. Circumscribed lesion in the right lobe of liver measuring 1.2 cm has appearance of cavernous hemangioma. Electronically Signed   By: Lucienne Capers M.D.   On: 09/15/2015 06:42     Assessment / Plan:   Brandyn Yom is a 30 y.o. G2P0020 admitted with suspected epiploic appendagitis.  1. Negative wet prep yesterday  2. Gonorrhea/chlamydia cultures negative 3. HIV, RPR Pending 4. Right adnexal tenderness and Cervical Motion Tenderness likely r/t epiploic appendagitis - pregnancy test and CT negative  Thank you for the opportunity to be involved with this pt's care.   Dr. Ouida Sills notified and agrees with plan of care.   Lars Pinks, CNM  [facility] Pager: [phone]

## 2015-09-17 NOTE — Consult Note (Signed)
[facility]ious Disease     Reason for Consult:ABd pain, fever    Referring Physician: Ether Griffins Date of Admission:  09/15/2015   Principal Problem:   Epiploic appendagitis   HPI: Nicole Gross is a 30 y.o. female admitted with one week abd pain. Her sxs began when she had her period begin and she thought was due to tampons she was using. Pain persisted intiially in lower abd but then became more diffuse. She had mild dysuria.  Had recent case of BV dxed at HD and treated with flagyl which she finished a week or two before pain began. She was last sexually active 3 weeks ago with her ex boyfriend. She has hx stds in past and says she had "many of them".   Since admit had fever. Has been treated with meropenem and one dose of cipro but had rash and swelling with it.  She is allergic to pcn with a severe reaction.  Currently feeling better. Had pelvic per her report today with gyn at [facility].   Past Medical History  Diagnosis Date  . Asthma   . Ectopic pregnancy     x 2   Past Surgical History  Procedure Laterality Date  . Ectopic pregnancy surgery     Social History  Substance Use Topics  . Smoking status: Current Every Day Smoker -- 0.50 packs/day for 10 years    Types: Cigarettes  . Smokeless tobacco: None  . Alcohol Use: No   Family History  Problem Relation Age of Onset  . Hypertension Mother   . Diabetes Mother   . Hypertension Father   . Diabetes Father     Allergies:  Allergies  Allergen Reactions  . Amoxicillin Anaphylaxis and Rash  . Ciprofloxacin Shortness Of Breath and Itching  . Penicillins Anaphylaxis and Rash    Has patient had a PCN reaction causing immediate rash, facial/tongue/throat swelling, SOB or lightheadedness with hypotension: yes: Has patient had a PCN reaction causing severe rash involving mucus membranes or skin necrosis: no Has patient had a PCN reaction that required hospitalization no Has patient had a PCN reaction occurring within the  last 10 years: no1} If all of the above answers are "NO", then may proceed with Cephalosporin use.     Current antibiotics: Antibiotics Given (last 72 hours)    Date/Time Action Medication Dose Rate   09/15/15 1427 Given   ciprofloxacin (CIPRO) IVPB 400 mg 400 mg 200 mL/hr   09/15/15 2206 Given   meropenem (MERREM) 1 g in sodium chloride 0.9 % 100 mL IVPB 1 g 200 mL/hr   09/16/15 0550 Given   meropenem (MERREM) 1 g in sodium chloride 0.9 % 100 mL IVPB 1 g 200 mL/hr   09/16/15 1345 Given   meropenem (MERREM) 1 g in sodium chloride 0.9 % 100 mL IVPB 1 g 200 mL/hr   09/16/15 2125 Given   meropenem (MERREM) 1 g in sodium chloride 0.9 % 100 mL IVPB 1 g 200 mL/hr   09/17/15 0605 Given   meropenem (MERREM) 1 g in sodium chloride 0.9 % 100 mL IVPB 1 g 200 mL/hr      MEDICATIONS: . docusate sodium  100 mg Oral BID  . enoxaparin (LOVENOX) injection  40 mg Subcutaneous Q24H  . meropenem (MERREM) IV  1 g Intravenous 3 times per day  . senna  1 tablet Oral Daily    Review of Systems - 11 systems reviewed and negative per HPI   OBJECTIVE: Temp:  Reina.Alexander  F (36.7 C)-99.1 F (37.3 C)] 98.4 F (36.9 C) (03/10 0512) Pulse Rate:  [70-82] 82 (03/10 1246) Resp:  [18-20] 20 (03/10 1246) BP: (91-114)/(52-77) 114/57 mmHg (03/10 1246) SpO2:  [98 %-100 %] 98 % (03/10 1246) Physical Exam  Constitutional:  oriented to person, place, and time. appears well-developed and well-nourished. No distress.  HENT: Alpha/AT, PERRLA, no scleral icterus Mouth/Throat: Oropharynx is clear and moist. No oropharyngeal exudate.  Cardiovascular: Normal rate, regular rhythm and normal heart sounds. Exam reveals no gallop and no friction rub.  No murmur heard.  Pulmonary/Chest: Effort normal and breath sounds normal. No respiratory distress.  has no wheezes.  Neck = supple, no nuchal rigidity Abdominal: Soft. Bowel sounds are normal.  Mild diffuse ttp, esp midline lq Lymphadenopathy: no cervical adenopathy. No axillary  adenopathy Neurological: alert and oriented to person, place, and time.  Skin: Skin is warm and dry. No rash noted. No erythema.  Psychiatric: a normal mood and affect.  behavior is normal.    LABS: Results for orders placed or performed during the hospital encounter of 09/15/15 (from the past 48 hour(s))  Magnesium     Status: None   Collection Time: 09/15/15  1:24 PM  Result Value Ref Range   Magnesium 1.9 1.7 - 2.4 mg/dL  CULTURE, BLOOD (ROUTINE X 2) w Reflex to PCR ID Panel     Status: None (Preliminary result)   Collection Time: 09/15/15  4:54 PM  Result Value Ref Range   Specimen Description BLOOD RIGHT ANTECUBITAL    Special Requests BOTTLES DRAWN AEROBIC AND ANAEROBIC 10ML    Culture NO GROWTH < 24 HOURS    Report Status PENDING   CULTURE, BLOOD (ROUTINE X 2) w Reflex to PCR ID Panel     Status: None (Preliminary result)   Collection Time: 09/15/15  4:58 PM  Result Value Ref Range   Specimen Description BLOOD    Special Requests NONE    Culture NO GROWTH < 24 HOURS    Report Status PENDING   Basic metabolic panel     Status: Abnormal   Collection Time: 09/16/15  4:36 AM  Result Value Ref Range   Sodium 142 135 - 145 mmol/L   Potassium 3.7 3.5 - 5.1 mmol/L   Chloride 112 (H) 101 - 111 mmol/L   CO2 24 22 - 32 mmol/L   Glucose, Bld 97 65 - 99 mg/dL   BUN 5 (L) 6 - 20 mg/dL   Creatinine, Ser 0.84 0.44 - 1.00 mg/dL   Calcium 8.1 (L) 8.9 - 10.3 mg/dL   GFR calc non Af Amer >60 >60 mL/min   GFR calc Af Amer >60 >60 mL/min    Comment: (NOTE) The eGFR has been calculated using the CKD EPI equation. This calculation has not been validated in all clinical situations. eGFR's persistently <60 mL/min signify possible Chronic Kidney Disease.    Anion gap 6 5 - 15  CBC     Status: Abnormal   Collection Time: 09/16/15  4:36 AM  Result Value Ref Range   WBC 12.1 (H) 3.6 - 11.0 K/uL   RBC 3.59 (L) 3.80 - 5.20 MIL/uL   Hemoglobin 11.0 (L) 12.0 - 16.0 g/dL   HCT 33.2 (L) 35.0 -  47.0 %   MCV 92.6 80.0 - 100.0 fL   MCH 30.8 26.0 - 34.0 pg   MCHC 33.2 32.0 - 36.0 g/dL   RDW 12.6 11.5 - 14.5 %   Platelets 255 150 - 440 K/uL  Wet prep, genital     Status: Abnormal   Collection Time: 09/16/15  9:27 PM  Result Value Ref Range   Yeast Wet Prep HPF POC NONE SEEN NONE SEEN   Trich, Wet Prep NONE SEEN NONE SEEN   Clue Cells Wet Prep HPF POC NONE SEEN NONE SEEN   WBC, Wet Prep HPF POC MANY (A) NONE SEEN   Sperm NONE SEEN   CBC     Status: Abnormal   Collection Time: 09/17/15  4:13 AM  Result Value Ref Range   WBC 5.9 3.6 - 11.0 K/uL   RBC 3.57 (L) 3.80 - 5.20 MIL/uL   Hemoglobin 11.1 (L) 12.0 - 16.0 g/dL   HCT 33.5 (L) 35.0 - 47.0 %   MCV 93.8 80.0 - 100.0 fL   MCH 31.0 26.0 - 34.0 pg   MCHC 33.1 32.0 - 36.0 g/dL   RDW 12.6 11.5 - 14.5 %   Platelets 251 150 - 440 K/uL   No components found for: ESR, C REACTIVE PROTEIN MICRO: Recent Results (from the past 720 hour(s))  Urine culture     Status: None   Collection Time: 09/15/15  8:15 AM  Result Value Ref Range Status   Specimen Description URINE, CLEAN CATCH  Final   Special Requests Normal  Final   Culture   Final    50,000 COLONIES/mL GROUP B STREP(S.AGALACTIAE)ISOLATED There is no known Penicillin Resistant Beta Streptococcus in the [place] For patients that are Penicillin-allergic, Erythromycin is 85-94% susceptible, and Clindamycin is 80% susceptible.  Contact Microbiology within 7 days if sensitivity testing is  required.   Virtually 100% of S. agalactiae (Group B) strains are susceptible to Penicillin.  For Penicillin-allergic patients, Erythromycin (85-95% sensitive) and Clindamycin (80% sensitive) are drugs of choice. Contact microbiology lab to request sensitivities if  needed within 7 days.    Report Status 09/17/2015 FINAL  Final  CULTURE, BLOOD (ROUTINE X 2) w Reflex to PCR ID Panel     Status: None (Preliminary result)   Collection Time: 09/15/15  4:54 PM  Result Value Ref Range Status   Specimen  Description BLOOD RIGHT ANTECUBITAL  Final   Special Requests BOTTLES DRAWN AEROBIC AND ANAEROBIC 10ML  Final   Culture NO GROWTH < 24 HOURS  Final   Report Status PENDING  Incomplete  CULTURE, BLOOD (ROUTINE X 2) w Reflex to PCR ID Panel     Status: None (Preliminary result)   Collection Time: 09/15/15  4:58 PM  Result Value Ref Range Status   Specimen Description BLOOD  Final   Special Requests NONE  Final   Culture NO GROWTH < 24 HOURS  Final   Report Status PENDING  Incomplete  Wet prep, genital     Status: Abnormal   Collection Time: 09/16/15  9:27 PM  Result Value Ref Range Status   Yeast Wet Prep HPF POC NONE SEEN NONE SEEN Final   Trich, Wet Prep NONE SEEN NONE SEEN Final   Clue Cells Wet Prep HPF POC NONE SEEN NONE SEEN Final   WBC, Wet Prep HPF POC MANY (A) NONE SEEN Final   Sperm NONE SEEN  Final    IMAGING: Ct Abdomen Pelvis W Contrast  09/15/2015  CLINICAL DATA:  Abdominal pain EXAM: CT ABDOMEN AND PELVIS WITH CONTRAST TECHNIQUE: Multidetector CT imaging of the abdomen and pelvis was performed using the standard protocol following bolus administration of intravenous contrast. CONTRAST:  162m OMNIPAQUE IOHEXOL 300 MG/ML  SOLN COMPARISON:  06/20/2005 FINDINGS: Lower  chest:  No acute findings. Hepatobiliary: 8 mm mixed attenuation structure within right lobe of liver is identified, image 9 of series 2. Hyper attenuating structure within the lateral aspect of right lobe of liver measures 1 cm, image 15/series 2. Pancreas: No mass, inflammatory changes, or other significant abnormality. Spleen: Within normal limits in size and appearance. Adrenals/Urinary Tract: No masses identified. No evidence of hydronephrosis. Stomach/Bowel: No evidence of obstruction, inflammatory process, or abnormal fluid collections. The appendix is visualized and appears normal. Vascular/Lymphatic: No pathologically enlarged lymph nodes. No evidence of abdominal aortic aneurysm. Reproductive: The uterus and  adnexal structures are unremarkable. Other: Trace free fluid noted within the pelvis. Along the left pericolic gutter there are several subtle areas of soft tissue stranding within the peritoneal fat posterior to the colon. For example, image number 53 of series 2 and image number 54 of series 5. Musculoskeletal:  No suspicious bone lesions identified. IMPRESSION: 1. Trace free fluid noted within the pelvis. Additionally, several areas of nonspecific soft tissue stranding within the fat along the left pericolic gutter posterior to the colon are noted. This is favored to be postinflammatory or infectious. Differential considerations include but are not limited to epiploic appendagitis, endometriosis, or other nonspecific inflammatory process. Although felt much less likely peritoneal disease from malignancy may have a similar appearance. A followup examination in 3 months with repeat CT of the abdomen pelvis is suggested to confirm resolution. 2. No evidence for acute appendicitis. 3. There are several lesions identified within the liver, these are favored to represent benign hemangiomas. Electronically Signed   By: Kerby Moors M.D.   On: 09/15/2015 09:50   US Abdomen Limited Ruq  09/15/2015  CLINICAL DATA:  Abdominal pain for 2 days. Previous history of ectopic pregnancy. EXAM: US ABDOMEN LIMITED - RIGHT UPPER QUADRANT COMPARISON:  None. FINDINGS: Gallbladder: No gallstones or wall thickening visualized. No sonographic Murphy sign noted by sonographer. Common bile duct: Diameter: 3.2 mm, normal Liver: Circumscribed hyperechoic lesion demonstrated in the anterior right lobe of liver measuring about 1.2 cm maximal dimension. Appearance is consistent with hemangioma. No other focal liver lesions identified. Parenchymal echotexture is otherwise homogeneous. IMPRESSION: No evidence of cholelithiasis or cholecystitis. Circumscribed lesion in the right lobe of liver measuring 1.2 cm has appearance of cavernous  hemangioma. Electronically Signed   By: Lucienne Capers M.D.   On: 09/15/2015 06:42    Assessment:   Nicole Gross is a 30 y.o. female with what I suspect is PID. Clinically responding and no longer febrile. Tolerating po. Has severe pcn allergy. HIV neg, RPR pending She has received 2 days of meropenem. This is not usually the treatment of choice for GC but should usually cover GC.  At this point I would think she could be dced.    Recommendations Dc on oral doxy 100 bid and flagyl 500 bid for 14 days total Fu on GC pCR and any testing done by gyn RPR pending.   Thank you very much for allowing me to participate in the care of this patient. Please call with questions.   Cheral Marker. Ola Spurr, MD

## 2015-09-17 NOTE — Progress Notes (Signed)
Pt being discharged home, discharge and prescriptions reviewed with pt and mother, states understanding, pt eager to discharge home, no noted complaints, no distress or discomfort noted, awaiting family for transportation

## 2015-09-18 LAB — RPR: RPR: NONREACTIVE

## 2015-09-18 NOTE — Care Management Note (Signed)
Case Management Note  Patient Details  Name: Nicole Gross MRN: [id number] Date of Birth: 11-14-1985  Subjective/Objective:    Received a phone call from [facility] stating that she was unable to pay for the medications which she was discharged from [facility] with 09/17/15. This Probation officer entered her into the [facility] medication assistance program and faxed the coupon for MATCH to [place] on [address]. Called [facility] and was informed that Nicole Gross can pick up her antibiotics for $3.00 each. Called Nicole Gross and she verbalized understanding that her antibiotics are now awaiting pick-up for $3.00 each.                 Action/Plan:   Expected Discharge Date:                  Expected Discharge Plan:     In-House Referral:     Discharge planning Services     Post Acute Care Choice:    Choice offered to:     DME Arranged:    DME Agency:     HH Arranged:    [place] Agency:     Status of Service:     Medicare Important Message Given:    Date Medicare IM Given:    Medicare IM give by:    Date Additional Medicare IM Given:    Additional Medicare Important Message give by:     If discussed at [place] of Stay Meetings, dates discussed:    Additional Comments:  Chad Donoghue A, RN 09/18/2015, 4:02 PM

## 2015-09-20 LAB — CULTURE, BLOOD (ROUTINE X 2)
CULTURE: NO GROWTH
Culture: NO GROWTH

## 2016-07-03 ENCOUNTER — Emergency Department: Payer: Self-pay

## 2016-07-03 ENCOUNTER — Observation Stay
Admission: EM | Admit: 2016-07-03 | Discharge: 2016-07-04 | Disposition: A | Discharge status: Home / Self Care | Payer: Self-pay | Attending: Obstetrics and Gynecology | Admitting: Obstetrics and Gynecology

## 2016-07-03 ENCOUNTER — Encounter: Payer: Self-pay | Admitting: Emergency Medicine

## 2016-07-03 DIAGNOSIS — R102 Pelvic and perineal pain: Secondary | ICD-10-CM

## 2016-07-03 DIAGNOSIS — E876 Hypokalemia: Secondary | ICD-10-CM | POA: Insufficient documentation

## 2016-07-03 DIAGNOSIS — A549 Gonococcal infection, unspecified: Secondary | ICD-10-CM | POA: Insufficient documentation

## 2016-07-03 DIAGNOSIS — J45909 Unspecified asthma, uncomplicated: Secondary | ICD-10-CM | POA: Insufficient documentation

## 2016-07-03 DIAGNOSIS — F17210 Nicotine dependence, cigarettes, uncomplicated: Secondary | ICD-10-CM | POA: Insufficient documentation

## 2016-07-03 DIAGNOSIS — D252 Subserosal leiomyoma of uterus: Secondary | ICD-10-CM | POA: Insufficient documentation

## 2016-07-03 DIAGNOSIS — Z880 Allergy status to penicillin: Secondary | ICD-10-CM | POA: Insufficient documentation

## 2016-07-03 DIAGNOSIS — N739 Female pelvic inflammatory disease, unspecified: Principal | ICD-10-CM | POA: Insufficient documentation

## 2016-07-03 DIAGNOSIS — Z881 Allergy status to other antibiotic agents status: Secondary | ICD-10-CM | POA: Insufficient documentation

## 2016-07-03 DIAGNOSIS — N730 Acute parametritis and pelvic cellulitis: Secondary | ICD-10-CM | POA: Diagnosis present

## 2016-07-03 LAB — CBC WITH DIFFERENTIAL/PLATELET
BASOS ABS: 0.1 10*3/uL (ref 0–0.1)
Basophils Relative: 1 %
EOS PCT: 7 %
Eosinophils Absolute: 0.7 10*3/uL (ref 0–0.7)
HEMATOCRIT: 35.7 % (ref 35.0–47.0)
Hemoglobin: 12.1 g/dL (ref 12.0–16.0)
LYMPHS ABS: 2.8 10*3/uL (ref 1.0–3.6)
LYMPHS PCT: 27 %
MCH: 30.9 pg (ref 26.0–34.0)
MCHC: 33.9 g/dL (ref 32.0–36.0)
MCV: 91.2 fL (ref 80.0–100.0)
Monocytes Absolute: 0.6 10*3/uL (ref 0.2–0.9)
Monocytes Relative: 6 %
NEUTROS ABS: 6.1 10*3/uL (ref 1.4–6.5)
Neutrophils Relative %: 59 %
PLATELETS: 285 10*3/uL (ref 150–440)
RBC: 3.91 MIL/uL (ref 3.80–5.20)
RDW: 12.6 % (ref 11.5–14.5)
WBC: 10.3 10*3/uL (ref 3.6–11.0)

## 2016-07-03 LAB — COMPREHENSIVE METABOLIC PANEL
ALK PHOS: 75 U/L (ref 38–126)
ALT: 10 U/L — ABNORMAL LOW (ref 14–54)
ANION GAP: 4 — AB (ref 5–15)
AST: 22 U/L (ref 15–41)
Albumin: 3.4 g/dL — ABNORMAL LOW (ref 3.5–5.0)
BUN: 9 mg/dL (ref 6–20)
CO2: 26 mmol/L (ref 22–32)
CREATININE: 0.87 mg/dL (ref 0.44–1.00)
Calcium: 8.8 mg/dL — ABNORMAL LOW (ref 8.9–10.3)
Chloride: 110 mmol/L (ref 101–111)
Glucose, Bld: 108 mg/dL — ABNORMAL HIGH (ref 65–99)
Potassium: 3.4 mmol/L — ABNORMAL LOW (ref 3.5–5.1)
SODIUM: 140 mmol/L (ref 135–145)
TOTAL PROTEIN: 6.5 g/dL (ref 6.5–8.1)
Total Bilirubin: 0.5 mg/dL (ref 0.3–1.2)

## 2016-07-03 LAB — CBC
HCT: 37 % (ref 35.0–47.0)
HEMOGLOBIN: 12.7 g/dL (ref 12.0–16.0)
MCH: 31.3 pg (ref 26.0–34.0)
MCHC: 34.2 g/dL (ref 32.0–36.0)
MCV: 91.4 fL (ref 80.0–100.0)
PLATELETS: 307 10*3/uL (ref 150–440)
RBC: 4.05 MIL/uL (ref 3.80–5.20)
RDW: 12.7 % (ref 11.5–14.5)
WBC: 11.6 10*3/uL — AB (ref 3.6–11.0)

## 2016-07-03 LAB — WET PREP, GENITAL
Clue Cells Wet Prep HPF POC: NONE SEEN
SPERM: NONE SEEN
TRICH WET PREP: NONE SEEN
YEAST WET PREP: NONE SEEN

## 2016-07-03 LAB — URINALYSIS, COMPLETE (UACMP) WITH MICROSCOPIC
BILIRUBIN URINE: NEGATIVE
Bacteria, UA: NONE SEEN
GLUCOSE, UA: NEGATIVE mg/dL
KETONES UR: NEGATIVE mg/dL
NITRITE: NEGATIVE
PH: 5 (ref 5.0–8.0)
PROTEIN: 30 mg/dL — AB
Specific Gravity, Urine: 1.028 (ref 1.005–1.030)

## 2016-07-03 LAB — LIPASE, BLOOD: Lipase: 17 U/L (ref 11–51)

## 2016-07-03 LAB — POCT PREGNANCY, URINE: PREG TEST UR: NEGATIVE

## 2016-07-03 LAB — CHLAMYDIA/NGC RT PCR (ARMC ONLY)
Chlamydia Tr: NOT DETECTED
N gonorrhoeae: DETECTED — AB

## 2016-07-03 MED ORDER — DEXTROSE 5 % IV SOLN
5.0000 mg/kg | Freq: Once | INTRAVENOUS | Status: AC
  Administered 2016-07-03: 340 mg via INTRAVENOUS
  Filled 2016-07-03: qty 8.5

## 2016-07-03 MED ORDER — HYDROMORPHONE HCL 1 MG/ML IJ SOLN: 0.2000 mg | INTRAMUSCULAR | Status: DC | PRN

## 2016-07-03 MED ORDER — CLINDAMYCIN PHOSPHATE 900 MG/50ML IV SOLN
900.0000 mg | Freq: Three times a day (TID) | INTRAVENOUS | Status: DC
  Administered 2016-07-03 – 2016-07-04 (×3): 900 mg via INTRAVENOUS
  Filled 2016-07-03 (×7): qty 50

## 2016-07-03 MED ORDER — DOCUSATE SODIUM 100 MG PO CAPS
100.0000 mg | ORAL_CAPSULE | Freq: Two times a day (BID) | ORAL | Status: DC
  Filled 2016-07-03: qty 1

## 2016-07-03 MED ORDER — MAGNESIUM CITRATE PO SOLN
1.0000 | Freq: Once | ORAL | Status: DC | PRN
  Filled 2016-07-03: qty 296

## 2016-07-03 MED ORDER — ONDANSETRON HCL 4 MG/2ML IJ SOLN: 4.0000 mg | Freq: Four times a day (QID) | INTRAMUSCULAR | Status: DC | PRN

## 2016-07-03 MED ORDER — OXYCODONE-ACETAMINOPHEN 5-325 MG PO TABS
1.0000 | ORAL_TABLET | Freq: Once | ORAL | Status: AC
  Administered 2016-07-03: 1 via ORAL
  Filled 2016-07-03: qty 1

## 2016-07-03 MED ORDER — CLINDAMYCIN PHOSPHATE 900 MG/50ML IV SOLN
900.0000 mg | Freq: Once | INTRAVENOUS | Status: AC
  Administered 2016-07-03: 900 mg via INTRAVENOUS
  Filled 2016-07-03: qty 50

## 2016-07-03 MED ORDER — ONDANSETRON HCL 4 MG PO TABS: 4.0000 mg | ORAL_TABLET | Freq: Four times a day (QID) | ORAL | Status: DC | PRN

## 2016-07-03 MED ORDER — LACTATED RINGERS IV SOLN
INTRAVENOUS | Status: DC
  Administered 2016-07-03 – 2016-07-04 (×2): via INTRAVENOUS

## 2016-07-03 MED ORDER — OXYCODONE-ACETAMINOPHEN 5-325 MG PO TABS
1.0000 | ORAL_TABLET | ORAL | Status: DC | PRN
  Administered 2016-07-03 – 2016-07-04 (×5): 1 via ORAL
  Filled 2016-07-03 (×5): qty 1

## 2016-07-03 MED ORDER — BISACODYL 5 MG PO TBEC: 5.0000 mg | DELAYED_RELEASE_TABLET | Freq: Every day | ORAL | Status: DC | PRN

## 2016-07-03 MED ORDER — NICOTINE 14 MG/24HR TD PT24
14.0000 mg | MEDICATED_PATCH | Freq: Every day | TRANSDERMAL | Status: DC
  Filled 2016-07-03 (×2): qty 1

## 2016-07-03 MED ORDER — MAGNESIUM HYDROXIDE 400 MG/5ML PO SUSP: 30.0000 mL | Freq: Every day | ORAL | Status: DC | PRN

## 2016-07-03 MED ORDER — IBUPROFEN 600 MG PO TABS
600.0000 mg | ORAL_TABLET | Freq: Four times a day (QID) | ORAL | Status: DC | PRN
  Administered 2016-07-03 – 2016-07-04 (×3): 600 mg via ORAL
  Filled 2016-07-03 (×3): qty 1

## 2016-07-03 MED ORDER — ALUM & MAG HYDROXIDE-SIMETH 200-200-20 MG/5ML PO SUSP: 30.0000 mL | ORAL | Status: DC | PRN

## 2016-07-03 MED ORDER — ZOLPIDEM TARTRATE 5 MG PO TABS: 5.0000 mg | ORAL_TABLET | Freq: Every evening | ORAL | Status: DC | PRN

## 2016-07-03 MED ORDER — DEXTROSE 5 % IV SOLN
1.5000 mg/kg | Freq: Three times a day (TID) | INTRAVENOUS | Status: DC
  Administered 2016-07-03 – 2016-07-04 (×3): 100 mg via INTRAVENOUS
  Filled 2016-07-03 (×7): qty 2.5

## 2016-07-03 NOTE — ED Provider Notes (Signed)
[facility] Emergency Department Provider Note  ____________________________________________   First MD Initiated Contact with Patient 07/03/16 0845     (approximate)  I have reviewed the triage vital signs and the nursing notes.   HISTORY  Chief Complaint Abdominal Pain   HPI Nicole Gross is a 30 y.o. female with a history of an ectopic pregnancy as well as PID who is presenting to the emergency department today with lower abdominal pain over the past 3 days. Says the pain is worse in the suprapubic region but also radiates to the right lower quadrant. She says that she is also having vaginal spotting. Nicole Gross that this past March was diagnosed with PID and had a very similar presentation. She denies any nausea vomiting or diarrhea. He says the pain is 10 out of 10 and sharp at this time.    Past Medical History:  Diagnosis Date  . Asthma   . Ectopic pregnancy    x 2    Patient Active Problem List   Diagnosis Date Noted  . PID (acute pelvic inflammatory disease) 07/03/2016  . Female pelvic inflammatory disease 09/17/2015  . Leukocytosis 09/17/2015  . History of gastrointestinal bleeding 09/17/2015  . Anemia 09/17/2015  . Pyuria 09/17/2015  . Tobacco abuse 09/17/2015  . Epiploic appendagitis 09/15/2015    Past Surgical History:  Procedure Laterality Date  . ECTOPIC PREGNANCY SURGERY      Prior to Admission medications   Medication Sig Start Date End Date Taking? Authorizing Provider  ibuprofen (ADVIL,MOTRIN) 200 MG tablet Take 600 mg by mouth once.   Yes Historical Provider, MD  docusate sodium (COLACE) 100 MG capsule Take 1 capsule (100 mg total) by mouth 2 (two) times daily. 09/17/15   Theodoro Grist, MD  doxycycline (VIBRA-TABS) 100 MG tablet Take 1 tablet (100 mg total) by mouth every 12 (twelve) hours. Patient not taking: Reported on 07/03/2016 09/17/15   Theodoro Grist, MD  HYDROcodone-acetaminophen (NORCO/VICODIN) 5-325 MG tablet Take 1-2  tablets by mouth every 4 (four) hours as needed for moderate pain. Patient not taking: Reported on 07/03/2016 09/17/15   Theodoro Grist, MD  nicotine polacrilex (NICORETTE) 2 MG gum Take 1 each (2 mg total) by mouth as needed for smoking cessation. Patient not taking: Reported on 07/03/2016 09/17/15   Theodoro Grist, MD  senna (SENOKOT) 8.6 MG TABS tablet Take 1 tablet (8.6 mg total) by mouth daily. Patient not taking: Reported on 07/03/2016 09/17/15   Theodoro Grist, MD    Allergies Amoxicillin; Ciprofloxacin; and Penicillins  Family History  Problem Relation Age of Onset  . Hypertension Mother   . Diabetes Mother   . Hypertension Father   . Diabetes Father     Social History Social History  Substance Use Topics  . Smoking status: Current Every Day Smoker    Packs/day: 0.50    Years: 10.00    Types: Cigarettes  . Smokeless tobacco: Never Used  . Alcohol use No    Review of Systems Constitutional: No fever/chills Eyes: No visual changes. ENT: No sore throat. Cardiovascular: Denies chest pain. Respiratory: Denies shortness of breath. Gastrointestinal:  No nausea, no vomiting.  No diarrhea.  No constipation. Genitourinary: Negative for dysuria. Musculoskeletal: Negative for back pain. Skin: Negative for rash. Neurological: Negative for headaches, focal weakness or numbness.  10-point ROS otherwise negative.  ____________________________________________   PHYSICAL EXAM:  VITAL SIGNS: ED Triage Vitals [07/03/16 0835]  Enc Vitals Group     BP 121/66     Pulse  Rate 98     Resp 20     Temp 98.5 F (36.9 C)     Temp Source Oral     SpO2 100 %     Weight 150 lb (68 kg)     Height 4\' 11"  (1.499 m)     Head Circumference      Peak Flow      Pain Score 8     Pain Loc      Pain Edu?      Excl. in [place]?     Constitutional: Alert and oriented. Well appearing and in no acute distress. Eyes: Conjunctivae are normal. PERRL. EOMI. Head: Atraumatic. Nose: No  congestion/rhinnorhea. Mouth/Throat: Mucous membranes are moist.   Neck: No stridor.   Cardiovascular: Normal rate, regular rhythm. Grossly normal heart sounds.   Respiratory: Normal respiratory effort.  No retractions. Lungs CTAB. Gastrointestinal: Soft With suprapubic as well as right lower quadrant tenderness. No guarding.  Genitourinary:  Normal external exam. Speculum exam with green to yellow discharge in the vagina that can be seen coming from the cervical os. Bimanual exam with cervical motion tenderness as well as uterine and right adnexal tenderness without any palpable masses. Musculoskeletal: No lower extremity tenderness nor edema.  No joint effusions. Neurologic:  Normal speech and language. No gross focal neurologic deficits are appreciated.  Skin:  Skin is warm, dry and intact. No rash noted. Psychiatric: Mood and affect are normal. Speech and behavior are normal.  ____________________________________________   LABS (all labs ordered are listed, but only abnormal results are displayed)  Labs Reviewed  WET PREP, GENITAL - Abnormal; Notable for the following:       Result Value   WBC, Wet Prep HPF POC MODERATE (*)    All other components within normal limits  CHLAMYDIA/NGC RT PCR ([facility] ONLY) - Abnormal; Notable for the following:    N gonorrhoeae DETECTED (*)    All other components within normal limits  COMPREHENSIVE METABOLIC PANEL - Abnormal; Notable for the following:    Potassium 3.4 (*)    Glucose, Bld 108 (*)    Calcium 8.8 (*)    Albumin 3.4 (*)    ALT 10 (*)    Anion gap 4 (*)    All other components within normal limits  CBC - Abnormal; Notable for the following:    WBC 11.6 (*)    All other components within normal limits  URINALYSIS, COMPLETE (UACMP) WITH MICROSCOPIC - Abnormal; Notable for the following:    Color, Urine YELLOW (*)    APPearance CLEAR (*)    Hgb urine dipstick SMALL (*)    Protein, ur 30 (*)    Leukocytes, UA LARGE (*)     Squamous Epithelial / LPF 0-5 (*)    All other components within normal limits  URINE CULTURE  LIPASE, BLOOD  POC URINE PREG, ED  POCT PREGNANCY, URINE   ____________________________________________  EKG   ____________________________________________  RADIOLOGY  [place] Art/Ven Flow Abd Pelv Doppler (Final result)  Result time 07/03/16 12:14:39  Final result by Greggory Keen, MD (07/03/16 12:14:39)           Narrative:   CLINICAL DATA: Right lower quadrant pain for 3 days, history of remote ectopic pregnancies, and uterine fibroids  EXAM: TRANSABDOMINAL AND TRANSVAGINAL ULTRASOUND OF PELVIS  DOPPLER ULTRASOUND OF OVARIES  TECHNIQUE: Both transabdominal and transvaginal ultrasound examinations of the pelvis were performed. Transabdominal technique was performed for global imaging of the pelvis including uterus, ovaries, adnexal  regions, and pelvic cul-de-sac.  It was necessary to proceed with endovaginal exam following the transabdominal exam to visualize the endometrium and adnexae in better detail. Color and duplex Doppler ultrasound was utilized to evaluate blood flow to the ovaries.  COMPARISON: 08/14/2009, 09/15/2015  FINDINGS: Uterus  Measurements: 8.2 x 3.4 x 4.7 cm. Small uterine fibroids noted. At least 3 are visualized.  Left anterior fundal subserosal fibroid measures 1.5 x 1.1 x 1.6 cm.  Right posterior subserosal fibroid measures 1.9 x 1.4 x 1.7 cm  Right posterior subserosal fibroid measures 1.0 x 1.2 x 1.1 cm  Endometrium  Thickness: 1.6 mm. No focal abnormality visualized.  Right ovary  Measurements: 4.0 x 2.3 x 2.3 cm. Normal appearance/no adnexal mass.  Left ovary  Measurements: 3.0 x 1.8 x 1.8 cm. Normal appearance/no adnexal mass.  Pulsed Doppler evaluation of both ovaries demonstrates normal low-resistance arterial and venous waveforms.  Other findings  No abnormal free fluid.  IMPRESSION: Small uterine fibroids.  No  other acute finding by ultrasound.   Electronically Signed By: Jerilynn Mages. Shick M.D. On: 07/03/2016 12:14            ____________________________________________   PROCEDURES  Procedure(s) performed:   Procedures  Critical Care performed:   ____________________________________________   INITIAL IMPRESSION / ASSESSMENT AND PLAN / ED COURSE  Pertinent labs & imaging results that were available during my care of the patient were reviewed by me and considered in my medical decision making (see chart for details).   Clinical Course   ----------------------------------------- 2:08 PM on 07/03/2016 -----------------------------------------  Patient with likely pelvic inflammatory disease. Positive for gonorrhea on her cultures. However, the patient has an anaphylactic penicillins as well as amoxicillin allergy. She is unclear if she has ever tolerated cephalosporins in the past. We have no record of the patient having cephalosporins in our pharmacy here. I discussed the case with the pharmacist and there is no obvious solution to treat this patient is an outpatient. She'll be treated as an inpatient. Discussed case with Dr. Coralyn Mark who examined the patient under her service. The patient is understanding of this plan and willing to comply.   ____________________________________________   FINAL CLINICAL IMPRESSION(S) / ED DIAGNOSES  Final diagnoses:  Pelvic pain  Pelvic inflammatory disease      NEW MEDICATIONS STARTED DURING THIS VISIT:  New Prescriptions   No medications on file     Note:  This document was prepared using Dragon voice recognition software and may include unintentional dictation errors.    Orbie Pyo, MD 07/03/16 [phone]

## 2016-07-03 NOTE — ED Notes (Signed)
Pt in Ultrasound at this time.

## 2016-07-03 NOTE — H&P (Signed)
Reason for Consult: Suspected PID Referring Physician: Larae Grooms, MD (ER Physician)  Nicole Gross is an 30 y.o. P15 female who presented to the ER for complaints of lower abdominal pain x 3 days.  Patient notes that it currently in suprapubic region, but also radiates in RLQ.  Denies nausea/vomiting, fevers, chills, urinary or bowel disturbances.  Does complain of vaginal spotting (however is towards the end of menstrual cycle, as LMP is 06/28/2016).  Patient diagnosed with PID in ER, has + gonorrhea testing today.  Patient is being admitted as she has severe allergies to PCN (anaphylaxis), and unable to treat with cephalosporins as outpatient. Patient has not been seen by a Gynecologist in several years.   Pertinent Gynecological History: Menses: flow is moderate Contraception: none DES exposure: denies Blood transfusions: none Sexually transmitted diseases: past history: PID in the past.  Previous GYN Procedures: none  Last pap: normal (per pt) Date: cannot recall, but over 3 years ago.  OB History: G4, P0   Menstrual History: Menarche age: 28 Patient's last menstrual period was 06/28/2016 (approximate).    Past Medical History:  Diagnosis Date  . Asthma   . Ectopic pregnancy    x 2 (1 on each side)    Past Surgical History:  Procedure Laterality Date  . ECTOPIC PREGNANCY SURGERY      Family History  Problem Relation Age of Onset  . Hypertension Mother   . Diabetes Mother   . Hypertension Father   . Diabetes Father     ; Social History   Social History  . Marital status: Single    Spouse name: N/A  . Number of children: N/A  . Years of education: N/A   Occupational History  . Not on file.   Social History Main Topics  . Smoking status: Current Every Day Smoker    Packs/day: 0.50    Years: 10.00    Types: Cigarettes  . Smokeless tobacco: Never Used  . Alcohol use No  . Drug use: No  . Sexual activity: Not on file   Other Topics Concern  . Not  on file   Social History Narrative  . No narrative on file    Allergies:  Allergies  Allergen Reactions  . Amoxicillin Anaphylaxis and Rash  . Ciprofloxacin Shortness Of Breath and Itching  . Penicillins Anaphylaxis and Rash    Has patient had a PCN reaction causing immediate rash, facial/tongue/throat swelling, SOB or lightheadedness with hypotension: yes: Has patient had a PCN reaction causing severe rash involving mucus membranes or skin necrosis: no Has patient had a PCN reaction that required hospitalization no Has patient had a PCN reaction occurring within the last 10 years: no1} If all of the above answers are "NO", then may proceed with Cephalosporin use.     Medications:  I have reviewed the patient's current medications. Prior to Admission:  Prescriptions Prior to Admission  Medication Sig Dispense Refill Last Dose  . ibuprofen (ADVIL,MOTRIN) 200 MG tablet Take 600 mg by mouth once.     . docusate sodium (COLACE) 100 MG capsule Take 1 capsule (100 mg total) by mouth 2 (two) times daily. 10 capsule 0 prn at prn  . doxycycline (VIBRA-TABS) 100 MG tablet Take 1 tablet (100 mg total) by mouth every 12 (twelve) hours. (Patient not taking: Reported on 07/03/2016) 28 tablet 0 Completed Course at Unknown time  . HYDROcodone-acetaminophen (NORCO/VICODIN) 5-325 MG tablet Take 1-2 tablets by mouth every 4 (four) hours as needed for moderate pain. (  Patient not taking: Reported on 07/03/2016) 30 tablet 0 Completed Course at Unknown time  . nicotine polacrilex (NICORETTE) 2 MG gum Take 1 each (2 mg total) by mouth as needed for smoking cessation. (Patient not taking: Reported on 07/03/2016) 100 tablet 0 Not Taking at Unknown time  . senna (SENOKOT) 8.6 MG TABS tablet Take 1 tablet (8.6 mg total) by mouth daily. (Patient not taking: Reported on 07/03/2016) 120 each 0 Not Taking at Unknown time    Review of Systems  Constitutional: Negative for chills, fever and weight loss.  HENT:  Negative for ear discharge and sore throat.   Eyes: Negative for blurred vision, pain and discharge.  Respiratory: Negative for cough, sputum production, shortness of breath and wheezing.   Cardiovascular: Negative for chest pain, palpitations and leg swelling.  Gastrointestinal: Positive for abdominal pain. Negative for constipation, diarrhea, heartburn, nausea and vomiting.       Pain in RLQ and suprapubic region  Genitourinary: Negative for dysuria, flank pain, frequency, hematuria and urgency.       Positive for vaginal discharge  Musculoskeletal: Negative for back pain, falls, myalgias and neck pain.  Skin: Negative for itching and rash.  Neurological: Negative for dizziness, seizures, weakness and headaches.  Endo/Heme/Allergies: Negative for environmental allergies. Does not bruise/bleed easily.  Psychiatric/Behavioral: Negative for depression and suicidal ideas.    Blood pressure 106/65, pulse 61, temperature 98.5 F (36.9 C), temperature source Oral, resp. rate 18, height 4' 11"  (1.499 m), weight 150 lb (68 kg), last menstrual period 06/28/2016, SpO2 99 %. Physical Exam  Constitutional: She appears well-developed and well-nourished. No distress.  HENT:  Head: Normocephalic and atraumatic.  Left Ear: External ear normal.  Nose: Nose normal.  Mouth/Throat: Oropharynx is clear and moist. No oropharyngeal exudate.  Eyes: Conjunctivae and EOM are normal. Pupils are equal, round, and reactive to light. Right eye exhibits no discharge. Left eye exhibits no discharge. No scleral icterus.  Neck: Normal range of motion. Neck supple. No JVD present. No tracheal deviation present. No thyromegaly present.  Cardiovascular: Normal rate and regular rhythm.  Exam reveals gallop. Exam reveals no friction rub.   No murmur heard. Respiratory: Effort normal and breath sounds normal. No respiratory distress. She has no wheezes.  GI: Soft. Bowel sounds are normal. She exhibits mass. She exhibits no  distension. There is tenderness. There is no rebound and no guarding.  Mild, in suprapubic region  Genitourinary: Uterus normal. Vaginal discharge found.  Genitourinary Comments: Turbid, copious amounts of brownish yellow discharge.   Lymphadenopathy:    She has no cervical adenopathy.  Skin: She is not diaphoretic.  Psychiatric: She has a normal mood and affect. Her behavior is normal.    Results for orders placed or performed during the hospital encounter of 07/03/16 (from the past 48 hour(s))  Lipase, blood     Status: None   Collection Time: 07/03/16  8:53 AM  Result Value Ref Range   Lipase 17 11 - 51 U/L  Comprehensive metabolic panel     Status: Abnormal   Collection Time: 07/03/16  8:53 AM  Result Value Ref Range   Sodium 140 135 - 145 mmol/L   Potassium 3.4 (L) 3.5 - 5.1 mmol/L   Chloride 110 101 - 111 mmol/L   CO2 26 22 - 32 mmol/L   Glucose, Bld 108 (H) 65 - 99 mg/dL   BUN 9 6 - 20 mg/dL   Creatinine, Ser 0.87 0.44 - 1.00 mg/dL   Calcium 8.8 (L)  8.9 - 10.3 mg/dL   Total Protein 6.5 6.5 - 8.1 g/dL   Albumin 3.4 (L) 3.5 - 5.0 g/dL   AST 22 15 - 41 U/L   ALT 10 (L) 14 - 54 U/L   Alkaline Phosphatase 75 38 - 126 U/L   Total Bilirubin 0.5 0.3 - 1.2 mg/dL   GFR calc non Af Amer >60 >60 mL/min   GFR calc Af Amer >60 >60 mL/min    Comment: (NOTE) The eGFR has been calculated using the CKD EPI equation. This calculation has not been validated in all clinical situations. eGFR's persistently <60 mL/min signify possible Chronic Kidney Disease.    Anion gap 4 (L) 5 - 15  CBC     Status: Abnormal   Collection Time: 07/03/16  8:53 AM  Result Value Ref Range   WBC 11.6 (H) 3.6 - 11.0 K/uL   RBC 4.05 3.80 - 5.20 MIL/uL   Hemoglobin 12.7 12.0 - 16.0 g/dL   HCT 37.0 35.0 - 47.0 %   MCV 91.4 80.0 - 100.0 fL   MCH 31.3 26.0 - 34.0 pg   MCHC 34.2 32.0 - 36.0 g/dL   RDW 12.7 11.5 - 14.5 %   Platelets 307 150 - 440 K/uL  Urinalysis, Complete w Microscopic     Status: Abnormal    Collection Time: 07/03/16  8:53 AM  Result Value Ref Range   Color, Urine YELLOW (A) YELLOW   APPearance CLEAR (A) CLEAR   Specific Gravity, Urine 1.028 1.005 - 1.030   pH 5.0 5.0 - 8.0   Glucose, UA NEGATIVE NEGATIVE mg/dL   Hgb urine dipstick SMALL (A) NEGATIVE   Bilirubin Urine NEGATIVE NEGATIVE   Ketones, ur NEGATIVE NEGATIVE mg/dL   Protein, ur 30 (A) NEGATIVE mg/dL   Nitrite NEGATIVE NEGATIVE   Leukocytes, UA LARGE (A) NEGATIVE   RBC / HPF 6-30 0 - 5 RBC/hpf   WBC, UA TOO NUMEROUS TO COUNT 0 - 5 WBC/hpf   Bacteria, UA NONE SEEN NONE SEEN   Squamous Epithelial / LPF 0-5 (A) NONE SEEN   Mucous PRESENT   Pregnancy, urine POC     Status: None   Collection Time: 07/03/16  9:20 AM  Result Value Ref Range   Preg Test, Ur NEGATIVE NEGATIVE    Comment:        THE SENSITIVITY OF THIS METHODOLOGY IS >24 mIU/mL   Wet prep, genital     Status: Abnormal   Collection Time: 07/03/16  9:50 AM  Result Value Ref Range   Yeast Wet Prep HPF POC NONE SEEN NONE SEEN   Trich, Wet Prep NONE SEEN NONE SEEN   Clue Cells Wet Prep HPF POC NONE SEEN NONE SEEN   WBC, Wet Prep HPF POC MODERATE (A) NONE SEEN   Sperm NONE SEEN   Chlamydia/NGC rt PCR ([facility] only)     Status: Abnormal   Collection Time: 07/03/16  9:50 AM  Result Value Ref Range   Specimen source GC/Chlam ENDOCERVICAL    Chlamydia Tr NOT DETECTED NOT DETECTED   N gonorrhoeae DETECTED (A) NOT DETECTED    Comment: (NOTE) 100  This methodology has not been evaluated in pregnant women or in 200  patients with a history of hysterectomy. 300 400  This methodology will not be performed on patients less than 28  years of age.     US Transvaginal Non-ob  Result Date: 07/03/2016 CLINICAL DATA:  Right lower quadrant pain for 3 days, history of remote ectopic  pregnancies, and uterine fibroids EXAM: TRANSABDOMINAL AND TRANSVAGINAL ULTRASOUND OF PELVIS DOPPLER ULTRASOUND OF OVARIES TECHNIQUE: Both transabdominal and transvaginal ultrasound  examinations of the pelvis were performed. Transabdominal technique was performed for global imaging of the pelvis including uterus, ovaries, adnexal regions, and pelvic cul-de-sac. It was necessary to proceed with endovaginal exam following the transabdominal exam to visualize the endometrium and adnexae in better detail. Color and duplex Doppler ultrasound was utilized to evaluate blood flow to the ovaries. COMPARISON:  08/14/2009, 09/15/2015 FINDINGS: Uterus Measurements: 8.2 x 3.4 x 4.7 cm. Small uterine fibroids noted. At least 3 are visualized. Left anterior fundal subserosal fibroid measures 1.5 x 1.1 x 1.6 cm. Right posterior subserosal fibroid measures 1.9 x 1.4 x 1.7 cm Right posterior subserosal fibroid measures 1.0 x 1.2 x 1.1 cm Endometrium Thickness: 1.6 mm.  No focal abnormality visualized. Right ovary Measurements: 4.0 x 2.3 x 2.3 cm. Normal appearance/no adnexal mass. Left ovary Measurements: 3.0 x 1.8 x 1.8 cm. Normal appearance/no adnexal mass. Pulsed Doppler evaluation of both ovaries demonstrates normal low-resistance arterial and venous waveforms. Other findings No abnormal free fluid. IMPRESSION: Small uterine fibroids. No other acute finding by ultrasound. Electronically Signed   By: Jerilynn Mages.  Shick M.D.   On: 07/03/2016 12:14   US Pelvis Complete  Result Date: 07/03/2016 CLINICAL DATA:  Right lower quadrant pain for 3 days, history of remote ectopic pregnancies, and uterine fibroids EXAM: TRANSABDOMINAL AND TRANSVAGINAL ULTRASOUND OF PELVIS DOPPLER ULTRASOUND OF OVARIES TECHNIQUE: Both transabdominal and transvaginal ultrasound examinations of the pelvis were performed. Transabdominal technique was performed for global imaging of the pelvis including uterus, ovaries, adnexal regions, and pelvic cul-de-sac. It was necessary to proceed with endovaginal exam following the transabdominal exam to visualize the endometrium and adnexae in better detail. Color and duplex Doppler ultrasound was  utilized to evaluate blood flow to the ovaries. COMPARISON:  08/14/2009, 09/15/2015 FINDINGS: Uterus Measurements: 8.2 x 3.4 x 4.7 cm. Small uterine fibroids noted. At least 3 are visualized. Left anterior fundal subserosal fibroid measures 1.5 x 1.1 x 1.6 cm. Right posterior subserosal fibroid measures 1.9 x 1.4 x 1.7 cm Right posterior subserosal fibroid measures 1.0 x 1.2 x 1.1 cm Endometrium Thickness: 1.6 mm.  No focal abnormality visualized. Right ovary Measurements: 4.0 x 2.3 x 2.3 cm. Normal appearance/no adnexal mass. Left ovary Measurements: 3.0 x 1.8 x 1.8 cm. Normal appearance/no adnexal mass. Pulsed Doppler evaluation of both ovaries demonstrates normal low-resistance arterial and venous waveforms. Other findings No abnormal free fluid. IMPRESSION: Small uterine fibroids. No other acute finding by ultrasound. Electronically Signed   By: Jerilynn Mages.  Shick M.D.   On: 07/03/2016 12:14   [place] Art/ven Flow Abd Pelv Doppler  Result Date: 07/03/2016 CLINICAL DATA:  Right lower quadrant pain for 3 days, history of remote ectopic pregnancies, and uterine fibroids EXAM: TRANSABDOMINAL AND TRANSVAGINAL ULTRASOUND OF PELVIS DOPPLER ULTRASOUND OF OVARIES TECHNIQUE: Both transabdominal and transvaginal ultrasound examinations of the pelvis were performed. Transabdominal technique was performed for global imaging of the pelvis including uterus, ovaries, adnexal regions, and pelvic cul-de-sac. It was necessary to proceed with endovaginal exam following the transabdominal exam to visualize the endometrium and adnexae in better detail. Color and duplex Doppler ultrasound was utilized to evaluate blood flow to the ovaries. COMPARISON:  08/14/2009, 09/15/2015 FINDINGS: Uterus Measurements: 8.2 x 3.4 x 4.7 cm. Small uterine fibroids noted. At least 3 are visualized. Left anterior fundal subserosal fibroid measures 1.5 x 1.1 x 1.6 cm. Right posterior subserosal fibroid measures 1.9 x  1.4 x 1.7 cm Right posterior subserosal  fibroid measures 1.0 x 1.2 x 1.1 cm Endometrium Thickness: 1.6 mm.  No focal abnormality visualized. Right ovary Measurements: 4.0 x 2.3 x 2.3 cm. Normal appearance/no adnexal mass. Left ovary Measurements: 3.0 x 1.8 x 1.8 cm. Normal appearance/no adnexal mass. Pulsed Doppler evaluation of both ovaries demonstrates normal low-resistance arterial and venous waveforms. Other findings No abnormal free fluid. IMPRESSION: Small uterine fibroids. No other acute finding by ultrasound. Electronically Signed   By: Jerilynn Mages.  Shick M.D.   On: 07/03/2016 12:14    Assessment/Plan: 1. PID - patient currently with mild case of PID (afebrile, no significantly elevatee WBC count, or nausea/vomiting), however patient has severe allergies to PCN.  Gonorrhea testing positive today. No evidence of pelvic abscess on today's ultrasound. UA negative except 3+ leukocytes, pending culture. Will treat with IV gentamycin/clindamycin x 24 hrs in order to avoid cephalosporin cross-reactivity.  Once treated, patient can continue with outpatient regimen of doxycyline x 14 days.  Counseled on safe sex practices. Patient has h/o negative RPR and HIV testing in March with last episode of PID.  Will retest today.  2. Tobacco abuse - will prescribe Nicotine patch while patient is inpatient.    Rubie Maid, MD [facility] 07/03/2016

## 2016-07-03 NOTE — ED Triage Notes (Signed)
Pt presents with right lower quadrant pain x 3 days, worse today. Pt states that she had similar pain a few months ago, which was diagnosed as PID. Pt states this is not as bad. Denies n/v/fever. Pt alert & oriented.

## 2016-07-04 DIAGNOSIS — N730 Acute parametritis and pelvic cellulitis: Secondary | ICD-10-CM

## 2016-07-04 LAB — CBC
HCT: 33.5 % — ABNORMAL LOW (ref 35.0–47.0)
Hemoglobin: 11.2 g/dL — ABNORMAL LOW (ref 12.0–16.0)
MCH: 30.8 pg (ref 26.0–34.0)
MCHC: 33.5 g/dL (ref 32.0–36.0)
MCV: 92 fL (ref 80.0–100.0)
PLATELETS: 249 10*3/uL (ref 150–440)
RBC: 3.64 MIL/uL — ABNORMAL LOW (ref 3.80–5.20)
RDW: 12.9 % (ref 11.5–14.5)
WBC: 7.7 10*3/uL (ref 3.6–11.0)

## 2016-07-04 LAB — RAPID HIV SCREEN (HIV 1/2 AB+AG)
HIV 1/2 ANTIBODIES: NONREACTIVE
HIV-1 P24 ANTIGEN - HIV24: NONREACTIVE

## 2016-07-04 LAB — URINE CULTURE: Culture: NO GROWTH

## 2016-07-04 MED ORDER — MEDROXYPROGESTERONE ACETATE 150 MG/ML IM SUSP
150.0000 mg | Freq: Once | INTRAMUSCULAR | Status: AC
  Administered 2016-07-04: 150 mg via INTRAMUSCULAR
  Filled 2016-07-04: qty 1

## 2016-07-04 MED ORDER — IBUPROFEN 800 MG PO TABS: 800.0000 mg | ORAL_TABLET | Freq: Three times a day (TID) | ORAL | 1 refills | Status: DC | PRN

## 2016-07-04 MED ORDER — HYDROCODONE-ACETAMINOPHEN 5-325 MG PO TABS: 1.0000 | ORAL_TABLET | Freq: Four times a day (QID) | ORAL | 0 refills | Status: DC | PRN

## 2016-07-04 MED ORDER — DOXYCYCLINE HYCLATE 100 MG PO TABS: 100.0000 mg | ORAL_TABLET | Freq: Two times a day (BID) | ORAL | 0 refills | Status: DC

## 2016-07-04 NOTE — Progress Notes (Signed)
Reviewed d/c instructions and prescriptions with patient and answered any questions.  Patient d/c home via wheelchair by nursing staff.

## 2016-07-04 NOTE — Care Management (Signed)
Admitted to [facility] with the diagnosis of abdominal pain under observation status. Received referral for medication assistance. Discharged from this facility 09/17/15 with assistance from the Match program for her Asthma medications.  Nicole Gross sleeping when entered room. Will leave Medication Management application on counter. Will update Nurse Maudie Mercury concerning the application process for Medication Management. Shelbie Ammons RN MSN CCM Care Management
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
## 2016-07-04 NOTE — Discharge Instructions (Signed)
Pelvic Inflammatory Disease Introduction Pelvic inflammatory disease (PID) refers to an infection in some or all of the female organs. The infection can be in the uterus, ovaries, fallopian tubes, or the surrounding tissues in the pelvis. PID can cause abdominal or pelvic pain that comes on suddenly (acute pelvic pain). PID is a serious infection because it can lead to lasting (chronic) pelvic pain or the inability to have children (infertility). What are the causes? This condition is most often caused by an infection that is spread during sexual contact. However, the infection can also be caused by the normal bacteria that are found in the vaginal tissues if these bacteria travel upward into the reproductive organs. PID can also occur following:  The birth of a baby.  A miscarriage.  An abortion.  Major pelvic surgery.  The use of an intrauterine device (IUD).  A sexual assault. What increases the risk? This condition is more likely to develop in women who:  Are younger than 30 years of age.  Are sexually active at [facility] age.  Use nonbarrier contraception.  Have multiple sexual partners.  Have sex with someone who has symptoms of an STD (sexually transmitted disease).  Use oral contraception. At times, certain behaviors can also increase the possibility of getting PID, such as:  Using a vaginal douche.  Having an IUD in place. What are the signs or symptoms? Symptoms of this condition include:  Abdominal or pelvic pain.  Fever.  Chills.  Abnormal vaginal discharge.  Abnormal uterine bleeding.  Unusual pain shortly after the end of a menstrual period.  Painful urination.  Pain with sexual intercourse.  Nausea and vomiting. How is this diagnosed? To diagnose this condition, your health care provider will do a physical exam and take your medical history. A pelvic exam typically reveals great tenderness in the uterus and the surrounding pelvic tissues. You may  also have tests, such as:  Lab tests, including a pregnancy test, blood tests, and urine test.  Culture tests of the vagina and cervix to check for an STD.  Ultrasound.  A laparoscopic procedure to look inside the pelvis.  Examining vaginal secretions under a microscope. How is this treated? Treatment for this condition may involve one or more approaches.  Antibiotic medicines may be prescribed to be taken by mouth.  Sexual partners may need to be treated if the infection is caused by an STD.  For more severe cases, hospitalization may be needed to give antibiotics directly into a vein through an IV tube.  Surgery may be needed if other treatments do not help, but this is rare. It may take weeks until you are completely well. If you are diagnosed with PID, you should also be checked for human immunodeficiency virus (HIV). Your health care provider may test you for infection again 3 months after treatment. You should not have unprotected sex. Follow these instructions at home:  Take over-the-counter and prescription medicines only as told by your health care provider.  If you were prescribed an antibiotic medicine, take it as told by your health care provider. Do not stop taking the antibiotic even if you start to feel better.  Do not have sexual intercourse until treatment is completed or as told by your health care provider. If PID is confirmed, your recent sexual partners will need treatment, especially if you had unprotected sex.  Keep all follow-up visits as told by your health care provider. This is important. Contact a health care provider if:  You have  increased or abnormal vaginal discharge.  Your pain does not improve.  You vomit.  You have a fever.  You cannot tolerate your medicines.  Your partner has an STD.  You have pain when you urinate. Get help right away if:  You have increased abdominal or pelvic pain.  You have chills.  Your symptoms are not  better in 72 hours even with treatment. This information is not intended to replace advice given to you by your health care provider. Make sure you discuss any questions you have with your health care provider. Document Released: 06/26/2005 Document Revised: 12/02/2015 Document Reviewed: 08/03/2014  2017 [employer]

## 2016-07-04 NOTE — Discharge Summary (Signed)
Physician Discharge Summary  Patient ID: Nicole Gross MRN: [id number] DOB/AGE: 08-Mar-1986 30 y.o.  Admit date: 07/03/2016 Discharge date: 07/04/2016  Admission Diagnoses: Pelvic inflammatory disease [N73.9] Pelvic pain [R10.2]  Discharge Diagnoses:  Active Problems:   PID (acute pelvic inflammatory disease)   Discharged Condition: good  Hospital Course: The patient was admitted to the hospital for parenteral treatment for PID secondary to allergies to several antibiotic regimens recommended for outpatient therapy.  She was treated with IV Gentamycin and Clindamycin x 24 hours.  She was discharged home the following day to continue Doxycycline 100 mg BID x 14 days as outpatient.  She was given an injection of Depo Provera 150 mg IM for contraception use.   Consults: None  Significant Diagnostic Studies: labs and microbiology: genital culture and ultrasound  Results for orders placed or performed during the hospital encounter of 07/03/16  Wet prep, genital  Result Value Ref Range   Yeast Wet Prep HPF POC NONE SEEN NONE SEEN   Trich, Wet Prep NONE SEEN NONE SEEN   Clue Cells Wet Prep HPF POC NONE SEEN NONE SEEN   WBC, Wet Prep HPF POC MODERATE (A) NONE SEEN   Sperm NONE SEEN   Chlamydia/NGC rt PCR ([facility] only)  Result Value Ref Range   Specimen source GC/Chlam ENDOCERVICAL    Chlamydia Tr NOT DETECTED NOT DETECTED   N gonorrhoeae DETECTED (A) NOT DETECTED  Urine culture  Result Value Ref Range   Specimen Description URINE, RANDOM    Special Requests NONE    Culture NO GROWTH Performed at [facility]     Report Status 07/04/2016 FINAL   Lipase, blood  Result Value Ref Range   Lipase 17 11 - 51 U/L  Comprehensive metabolic panel  Result Value Ref Range   Sodium 140 135 - 145 mmol/L   Potassium 3.4 (L) 3.5 - 5.1 mmol/L   Chloride 110 101 - 111 mmol/L   CO2 26 22 - 32 mmol/L   Glucose, Bld 108 (H) 65 - 99 mg/dL   BUN 9 6 - 20 mg/dL   Creatinine, Ser 0.87 0.44  - 1.00 mg/dL   Calcium 8.8 (L) 8.9 - 10.3 mg/dL   Total Protein 6.5 6.5 - 8.1 g/dL   Albumin 3.4 (L) 3.5 - 5.0 g/dL   AST 22 15 - 41 U/L   ALT 10 (L) 14 - 54 U/L   Alkaline Phosphatase 75 38 - 126 U/L   Total Bilirubin 0.5 0.3 - 1.2 mg/dL   GFR calc non Af Amer >60 >60 mL/min   GFR calc Af Amer >60 >60 mL/min   Anion gap 4 (L) 5 - 15  CBC  Result Value Ref Range   WBC 11.6 (H) 3.6 - 11.0 K/uL   RBC 4.05 3.80 - 5.20 MIL/uL   Hemoglobin 12.7 12.0 - 16.0 g/dL   HCT 37.0 35.0 - 47.0 %   MCV 91.4 80.0 - 100.0 fL   MCH 31.3 26.0 - 34.0 pg   MCHC 34.2 32.0 - 36.0 g/dL   RDW 12.7 11.5 - 14.5 %   Platelets 307 150 - 440 K/uL  Urinalysis, Complete w Microscopic  Result Value Ref Range   Color, Urine YELLOW (A) YELLOW   APPearance CLEAR (A) CLEAR   Specific Gravity, Urine 1.028 1.005 - 1.030   pH 5.0 5.0 - 8.0   Glucose, UA NEGATIVE NEGATIVE mg/dL   Hgb urine dipstick SMALL (A) NEGATIVE   Bilirubin Urine NEGATIVE NEGATIVE   Ketones,  ur NEGATIVE NEGATIVE mg/dL   Protein, ur 30 (A) NEGATIVE mg/dL   Nitrite NEGATIVE NEGATIVE   Leukocytes, UA LARGE (A) NEGATIVE   RBC / HPF 6-30 0 - 5 RBC/hpf   WBC, UA TOO NUMEROUS TO COUNT 0 - 5 WBC/hpf   Bacteria, UA NONE SEEN NONE SEEN   Squamous Epithelial / LPF 0-5 (A) NONE SEEN   Mucous PRESENT   CBC WITH DIFFERENTIAL  Result Value Ref Range   WBC 10.3 3.6 - 11.0 K/uL   RBC 3.91 3.80 - 5.20 MIL/uL   Hemoglobin 12.1 12.0 - 16.0 g/dL   HCT 35.7 35.0 - 47.0 %   MCV 91.2 80.0 - 100.0 fL   MCH 30.9 26.0 - 34.0 pg   MCHC 33.9 32.0 - 36.0 g/dL   RDW 12.6 11.5 - 14.5 %   Platelets 285 150 - 440 K/uL   Neutrophils Relative % 59 %   Neutro Abs 6.1 1.4 - 6.5 K/uL   Lymphocytes Relative 27 %   Lymphs Abs 2.8 1.0 - 3.6 K/uL   Monocytes Relative 6 %   Monocytes Absolute 0.6 0.2 - 0.9 K/uL   Eosinophils Relative 7 %   Eosinophils Absolute 0.7 0 - 0.7 K/uL   Basophils Relative 1 %   Basophils Absolute 0.1 0 - 0.1 K/uL  Rapid HIV screen (HIV 1/2  Ab+Ag)  Result Value Ref Range   HIV-1 P24 Antigen - HIV24 NON REACTIVE NON REACTIVE   HIV 1/2 Antibodies NON REACTIVE NON REACTIVE   Interpretation (HIV Ag Ab)      A non reactive test result means that HIV 1 or HIV 2 antibodies and HIV 1 p24 antigen were not detected in the specimen.  CBC  Result Value Ref Range   WBC 7.7 3.6 - 11.0 K/uL   RBC 3.64 (L) 3.80 - 5.20 MIL/uL   Hemoglobin 11.2 (L) 12.0 - 16.0 g/dL   HCT 33.5 (L) 35.0 - 47.0 %   MCV 92.0 80.0 - 100.0 fL   MCH 30.8 26.0 - 34.0 pg   MCHC 33.5 32.0 - 36.0 g/dL   RDW 12.9 11.5 - 14.5 %   Platelets 249 150 - 440 K/uL  RPR  Result Value Ref Range   RPR Ser Ql Non Reactive Non Reactive  Pregnancy, urine POC  Result Value Ref Range   Preg Test, Ur NEGATIVE NEGATIVE    Imaging:  CLINICAL DATA:  Right lower quadrant pain for 3 days, history of remote ectopic pregnancies, and uterine fibroids  EXAM: TRANSABDOMINAL AND TRANSVAGINAL ULTRASOUND OF PELVIS  DOPPLER ULTRASOUND OF OVARIES  TECHNIQUE: Both transabdominal and transvaginal ultrasound examinations of the pelvis were performed. Transabdominal technique was performed for global imaging of the pelvis including uterus, ovaries, adnexal regions, and pelvic cul-de-sac.  It was necessary to proceed with endovaginal exam following the transabdominal exam to visualize the endometrium and adnexae in better detail. Color and duplex Doppler ultrasound was utilized to evaluate blood flow to the ovaries.  COMPARISON:  08/14/2009, 09/15/2015  FINDINGS: Uterus  Measurements: 8.2 x 3.4 x 4.7 cm. Small uterine fibroids noted. At least 3 are visualized.  Left anterior fundal subserosal fibroid measures 1.5 x 1.1 x 1.6 cm.  Right posterior subserosal fibroid measures 1.9 x 1.4 x 1.7 cm  Right posterior subserosal fibroid measures 1.0 x 1.2 x 1.1 cm  Endometrium  Thickness: 1.6 mm.  No focal abnormality visualized.  Right ovary  Measurements: 4.0 x  2.3 x 2.3 cm. Normal appearance/no adnexal mass.  Left ovary  Measurements: 3.0 x 1.8 x 1.8 cm. Normal appearance/no adnexal mass.  Pulsed Doppler evaluation of both ovaries demonstrates normal low-resistance arterial and venous waveforms.  Other findings  No abnormal free fluid.  IMPRESSION: Small uterine fibroids.  No other acute finding by ultrasound.   Treatments: IV hydration and antibiotics: gentamycin and Clindamycin  Discharge Exam: Blood pressure (!) 98/57, pulse 68, temperature 98 F (36.7 C), temperature source Oral, resp. rate 18, height 4\' 11"  (1.499 m), weight 150 lb (68 kg), last menstrual period 06/28/2016, SpO2 99 %.  General appearance: alert and no distress Throat: lips, mucosa, and tongue normal; teeth and gums normal Neck: no adenopathy, no carotid bruit, no JVD, supple, symmetrical, trachea midline and thyroid not enlarged, symmetric, no tenderness/mass/nodules Resp: clear to auscultation bilaterally Cardio: regular rate and rhythm, S1, S2 normal, no murmur, click, rub or gallop GI: soft, non-tender; bowel sounds normal; no masses,  no organomegaly Pelvic: deferred Extremities: extremities normal, atraumatic, no cyanosis or edema Skin: Skin color, texture, turgor normal. No rashes or lesions  Disposition: 01-Home or Self Care   Allergies as of 07/04/2016      Reactions   Amoxicillin Anaphylaxis, Rash   Ciprofloxacin Shortness Of Breath, Itching   Penicillins Anaphylaxis, Rash   Has patient had a PCN reaction causing immediate rash, facial/tongue/throat swelling, SOB or lightheadedness with hypotension: yes: Has patient had a PCN reaction causing severe rash involving mucus membranes or skin necrosis: no Has patient had a PCN reaction that required hospitalization no Has patient had a PCN reaction occurring within the last 10 years: no1} If all of the above answers are "NO", then may proceed with Cephalosporin use.      Medication List     STOP taking these medications   docusate sodium 100 MG capsule Commonly known as:  COLACE   senna 8.6 MG Tabs tablet Commonly known as:  SENOKOT     TAKE these medications   doxycycline 100 MG tablet Commonly known as:  VIBRA-TABS Take 1 tablet (100 mg total) by mouth every 12 (twelve) hours.   HYDROcodone-acetaminophen 5-325 MG tablet Commonly known as:  NORCO/VICODIN Take 1-2 tablets by mouth every 6 (six) hours as needed for moderate pain. What changed:  when to take this   ibuprofen 800 MG tablet Commonly known as:  ADVIL,MOTRIN Take 1 tablet (800 mg total) by mouth every 8 (eight) hours as needed. What changed:  medication strength  how much to take  when to take this  reasons to take this   nicotine polacrilex 2 MG gum Commonly known as:  NICORETTE Take 1 each (2 mg total) by mouth as needed for smoking cessation.      Follow-up McCrory, MD Follow up.   Specialties:  Obstetrics and Gynecology, Radiology Why:  3-6 weeks, f/u pelvic pain, needs  TOC Contact information: [address] [place] [address] [phone]           Signed: Rubie Maid 07/04/2016, 9:21 AM

## 2016-07-05 LAB — RPR: RPR Ser Ql: NONREACTIVE

## 2017-08-02 ENCOUNTER — Other Ambulatory Visit: Payer: Self-pay

## 2017-08-02 ENCOUNTER — Encounter: Payer: Self-pay | Admitting: Emergency Medicine

## 2017-08-02 ENCOUNTER — Emergency Department
Admission: EM | Admit: 2017-08-02 | Discharge: 2017-08-02 | Disposition: A | Discharge status: Home / Self Care | Payer: Managed Care, Other (non HMO) | Attending: Emergency Medicine | Admitting: Emergency Medicine

## 2017-08-02 ENCOUNTER — Emergency Department: Payer: Managed Care, Other (non HMO)

## 2017-08-02 DIAGNOSIS — M79671 Pain in right foot: Secondary | ICD-10-CM | POA: Diagnosis present

## 2017-08-02 DIAGNOSIS — F17210 Nicotine dependence, cigarettes, uncomplicated: Secondary | ICD-10-CM | POA: Insufficient documentation

## 2017-08-02 DIAGNOSIS — Z79899 Other long term (current) drug therapy: Secondary | ICD-10-CM | POA: Diagnosis not present

## 2017-08-02 DIAGNOSIS — J45909 Unspecified asthma, uncomplicated: Secondary | ICD-10-CM | POA: Insufficient documentation

## 2017-08-02 DIAGNOSIS — M79672 Pain in left foot: Secondary | ICD-10-CM

## 2017-08-02 MED ORDER — MELOXICAM 15 MG PO TABS: 15.0000 mg | ORAL_TABLET | Freq: Every day | ORAL | 2 refills | Status: DC

## 2017-08-02 NOTE — ED Triage Notes (Signed)
Pt here with c/o right foot pain, denies injury, states pain is on the inside of her foot, limp to triage.

## 2017-08-02 NOTE — Discharge Instructions (Signed)
Follow-up with your regular doctor if you are not better in 5-7 days.  You can follow-up with the foot specialist.  His number has been provided for you.  Elevate and ice the foot as needed.  Wear the Ace wrap for extra support.  If the foot is becoming red and swollen please have the area rechecked.  Take the medication as prescribed

## 2017-08-02 NOTE — ED Provider Notes (Signed)
[facility] Department Provider Note  ____________________________________________   First MD Initiated Contact with Patient 08/02/17 [phone]     (approximate)  I have reviewed the triage vital signs and the nursing notes.   HISTORY  Chief Complaint Foot Pain    HPI Nicole Gross is a 32 y.o. female complains of right foot pain.  States she walks a lot on her job.  She states it was hurting so bad she just could not do it last night.  She complains of pain towards the upper foot into the ankle.  She denies any redness or swelling at the site.  She denies any known injury.  Past Medical History:  Diagnosis Date  . Asthma   . Ectopic pregnancy    x 2    Patient Active Problem List   Diagnosis Date Noted  . PID (acute pelvic inflammatory disease) 07/03/2016  . Female pelvic inflammatory disease 09/17/2015  . Leukocytosis 09/17/2015  . History of gastrointestinal bleeding 09/17/2015  . Anemia 09/17/2015  . Pyuria 09/17/2015  . Tobacco abuse 09/17/2015  . Epiploic appendagitis 09/15/2015    Past Surgical History:  Procedure Laterality Date  . ECTOPIC PREGNANCY SURGERY     h/o 1 ectopic on each side    Prior to Admission medications   Medication Sig Start Date End Date Taking? Authorizing Provider  doxycycline (VIBRA-TABS) 100 MG tablet Take 1 tablet (100 mg total) by mouth every 12 (twelve) hours. 07/04/16   Rubie Maid, MD  HYDROcodone-acetaminophen (NORCO/VICODIN) 5-325 MG tablet Take 1-2 tablets by mouth every 6 (six) hours as needed for moderate pain. 07/04/16   Rubie Maid, MD  ibuprofen (ADVIL,MOTRIN) 800 MG tablet Take 1 tablet (800 mg total) by mouth every 8 (eight) hours as needed. 07/04/16   Rubie Maid, MD  meloxicam (MOBIC) 15 MG tablet Take 1 tablet (15 mg total) by mouth daily. 08/02/17 08/02/18  Versie Starks, PA-C    Allergies Amoxicillin; Ciprofloxacin; and Penicillins  Family History  Problem Relation Age of  Onset  . Hypertension Mother   . Diabetes Mother   . Hypertension Father   . Diabetes Father     Social History Social History   Tobacco Use  . Smoking status: Current Every Day Smoker    Packs/day: 0.50    Years: 10.00    Pack years: 5.00    Types: Cigarettes  . Smokeless tobacco: Never Used  Substance Use Topics  . Alcohol use: No  . Drug use: No    Review of Systems  Constitutional: No fever/chills Eyes: No visual changes. ENT: No sore throat. Respiratory: Denies cough Genitourinary: Negative for dysuria. Musculoskeletal: Negative for back pain.  Positive for right foot pain Skin: Negative for rash.    ____________________________________________   PHYSICAL EXAM:  VITAL SIGNS: ED Triage Vitals  Enc Vitals Group     BP 08/02/17 0857 116/60     Pulse Rate 08/02/17 0857 84     Resp 08/02/17 0857 18     Temp 08/02/17 0857 98.4 F (36.9 C)     Temp Source 08/02/17 0857 Oral     SpO2 08/02/17 0857 97 %     Weight 08/02/17 0852 155 lb (70.3 kg)     Height 08/02/17 0852 4\' 11"  (1.499 m)     Head Circumference --      Peak Flow --      Pain Score 08/02/17 0852 8     Pain Loc --  Pain Edu? --      Excl. in [place]? --     Constitutional: Alert and oriented. Well appearing and in no acute distress. Eyes: Conjunctivae are normal.  Head: Atraumatic. Nose: No congestion/rhinnorhea. Mouth/Throat: Mucous membranes are moist.   Cardiovascular: Normal rate, regular rhythm. Respiratory: Normal respiratory effort.  No retractions GU: deferred Musculoskeletal: FROM all extremities, warm and well perfused.  Right foot is tender along the medial aspect.  There is no redness or swelling noted.  Neurovascular is intact. Neurologic:  Normal speech and language.  Skin:  Skin is warm, dry and intact. No rash noted. Psychiatric: Mood and affect are normal. Speech and behavior are normal.  ____________________________________________   LABS (all labs ordered are listed,  but only abnormal results are displayed)  Labs Reviewed - No data to display ____________________________________________   ____________________________________________  RADIOLOGY  X-ray of the right foot is negative for fracture  ____________________________________________   PROCEDURES  Procedure(s) performed: Ace wrap applied by the tech  Procedures    ____________________________________________   INITIAL IMPRESSION / ASSESSMENT AND PLAN / ED COURSE  Pertinent labs & imaging results that were available during my care of the patient were reviewed by me and considered in my medical decision making (see chart for details).  Is a 32 year old female complaining of right foot pain.  She walks a lot on her job.  On physical exam the foot is tender along the medial aspect and towards the medial malleolus.  There is no redness or swelling noted.  X-ray of the right foot is negative for fracture.  Patient was instructed to wear an Ace wrap for extra support.  She is to elevate and ice.  She is given a prescription for meloxicam 15 mg/day.  She is to follow-up with the podiatrist if she is not better in 5-7 days.  Patient states she understands will comply with our instructions.  She was given a work note for yesterday.  She was discharged in stable condition.     As part of my medical decision making, I reviewed the following data within the [place] notes reviewed and incorporated, Radiograph reviewed , Notes from prior ED visits and [place] Controlled Substance Database  ____________________________________________   FINAL CLINICAL IMPRESSION(S) / ED DIAGNOSES  Final diagnoses:  Acute pain of left foot      NEW MEDICATIONS STARTED DURING THIS VISIT:  Discharge Medication List as of 08/02/2017 10:30 AM    START taking these medications   Details  meloxicam (MOBIC) 15 MG tablet Take 1 tablet (15 mg total) by mouth daily., Starting Thu  08/02/2017, Until Fri 08/02/2018, Print         Note:  This document was prepared using Dragon voice recognition software and may include unintentional dictation errors.    Versie Starks, PA-C 08/02/17 1116    Delman Kitten, MD 08/02/17 [phone]

## 2017-08-02 NOTE — ED Notes (Signed)
See triage note  Presents with pain to top of right foot  states pain started about 3 days ago  Denies any specific injury  ambulates with limp d/t pain

## 2017-10-16 ENCOUNTER — Emergency Department: Payer: Self-pay

## 2017-10-16 ENCOUNTER — Other Ambulatory Visit: Payer: Self-pay

## 2017-10-16 ENCOUNTER — Emergency Department
Admission: EM | Admit: 2017-10-16 | Discharge: 2017-10-16 | Disposition: A | Discharge status: Home / Self Care | Payer: Self-pay | Attending: Emergency Medicine | Admitting: Emergency Medicine

## 2017-10-16 DIAGNOSIS — N730 Acute parametritis and pelvic cellulitis: Secondary | ICD-10-CM | POA: Insufficient documentation

## 2017-10-16 DIAGNOSIS — F17210 Nicotine dependence, cigarettes, uncomplicated: Secondary | ICD-10-CM | POA: Insufficient documentation

## 2017-10-16 DIAGNOSIS — R102 Pelvic and perineal pain: Secondary | ICD-10-CM | POA: Insufficient documentation

## 2017-10-16 DIAGNOSIS — R109 Unspecified abdominal pain: Secondary | ICD-10-CM

## 2017-10-16 DIAGNOSIS — J45909 Unspecified asthma, uncomplicated: Secondary | ICD-10-CM | POA: Insufficient documentation

## 2017-10-16 DIAGNOSIS — Z79899 Other long term (current) drug therapy: Secondary | ICD-10-CM | POA: Insufficient documentation

## 2017-10-16 LAB — HCG, QUANTITATIVE, PREGNANCY: hCG, Beta Chain, Quant, S: <1 m[IU]/mL (ref <5)

## 2017-10-16 LAB — COMPREHENSIVE METABOLIC PANEL
ALK PHOS: 62 U/L (ref 38–126)
ALT: 10 U/L — AB (ref 14–54)
AST: 19 U/L (ref 15–41)
Albumin: 3.2 g/dL — ABNORMAL LOW (ref 3.5–5.0)
Anion gap: 3 — ABNORMAL LOW (ref 5–15)
BUN: 13 mg/dL (ref 6–20)
CALCIUM: 8.4 mg/dL — AB (ref 8.9–10.3)
CHLORIDE: 111 mmol/L (ref 101–111)
CO2: 26 mmol/L (ref 22–32)
CREATININE: 0.89 mg/dL (ref 0.44–1.00)
GFR calc Af Amer: >60 mL/min (ref >60)
GFR calc non Af Amer: >60 mL/min (ref >60)
GLUCOSE: 98 mg/dL (ref 65–99)
Potassium: 4.2 mmol/L (ref 3.5–5.1)
SODIUM: 140 mmol/L (ref 135–145)
Total Bilirubin: 0.4 mg/dL (ref 0.3–1.2)
Total Protein: 5.8 g/dL — ABNORMAL LOW (ref 6.5–8.1)

## 2017-10-16 LAB — WET PREP, GENITAL
Clue Cells Wet Prep HPF POC: NONE SEEN
SPERM: NONE SEEN
Trich, Wet Prep: NONE SEEN
Yeast Wet Prep HPF POC: NONE SEEN

## 2017-10-16 LAB — CBC
HCT: 41.2 % (ref 35.0–47.0)
Hemoglobin: 13.5 g/dL (ref 12.0–16.0)
MCH: 30.7 pg (ref 26.0–34.0)
MCHC: 32.8 g/dL (ref 32.0–36.0)
MCV: 93.7 fL (ref 80.0–100.0)
PLATELETS: 356 10*3/uL (ref 150–440)
RBC: 4.4 MIL/uL (ref 3.80–5.20)
RDW: 12.9 % (ref 11.5–14.5)
WBC: 12.5 10*3/uL — AB (ref 3.6–11.0)

## 2017-10-16 LAB — POCT PREGNANCY, URINE: PREG TEST UR: NEGATIVE

## 2017-10-16 LAB — CHLAMYDIA/NGC RT PCR (ARMC ONLY)
Chlamydia Tr: DETECTED — AB
N GONORRHOEAE: DETECTED — AB

## 2017-10-16 MED ORDER — ONDANSETRON HCL 4 MG/2ML IJ SOLN
4.0000 mg | Freq: Once | INTRAMUSCULAR | Status: AC
  Administered 2017-10-16: 4 mg via INTRAVENOUS

## 2017-10-16 MED ORDER — ONDANSETRON HCL 4 MG/2ML IJ SOLN
INTRAMUSCULAR | Status: AC
  Administered 2017-10-16: 4 mg via INTRAVENOUS
  Filled 2017-10-16: qty 2

## 2017-10-16 MED ORDER — SODIUM CHLORIDE 0.9 % IV SOLN
100.0000 mg | Freq: Once | INTRAVENOUS | Status: DC
  Filled 2017-10-16: qty 100

## 2017-10-16 MED ORDER — OXYCODONE-ACETAMINOPHEN 5-325 MG PO TABS: 2.0000 | ORAL_TABLET | ORAL | 0 refills | Status: DC | PRN

## 2017-10-16 MED ORDER — MORPHINE SULFATE (PF) 4 MG/ML IV SOLN
4.0000 mg | Freq: Once | INTRAVENOUS | Status: AC
  Administered 2017-10-16: 4 mg via INTRAVENOUS
  Filled 2017-10-16: qty 1

## 2017-10-16 MED ORDER — HYDROMORPHONE HCL 1 MG/ML IJ SOLN
1.0000 mg | Freq: Once | INTRAMUSCULAR | Status: AC
  Administered 2017-10-16: 1 mg via INTRAVENOUS
  Filled 2017-10-16: qty 1

## 2017-10-16 MED ORDER — GENTAMICIN SULFATE 40 MG/ML IJ SOLN
1.5000 mg/kg | Freq: Once | INTRAVENOUS | Status: AC
  Administered 2017-10-16: 100 mg via INTRAVENOUS
  Filled 2017-10-16: qty 2.5

## 2017-10-16 MED ORDER — ONDANSETRON 4 MG PO TBDP: 4.0000 mg | ORAL_TABLET | Freq: Three times a day (TID) | ORAL | 0 refills | Status: DC | PRN

## 2017-10-16 MED ORDER — DOXYCYCLINE HYCLATE 100 MG PO TABS: 100.0000 mg | ORAL_TABLET | Freq: Two times a day (BID) | ORAL | 0 refills | Status: DC

## 2017-10-16 MED ORDER — MORPHINE SULFATE (PF) 4 MG/ML IV SOLN
INTRAVENOUS | Status: AC
  Administered 2017-10-16: 4 mg via INTRAVENOUS
  Filled 2017-10-16: qty 1

## 2017-10-16 MED ORDER — CLINDAMYCIN PHOSPHATE 900 MG/50ML IV SOLN
900.0000 mg | Freq: Once | INTRAVENOUS | Status: AC
  Administered 2017-10-16: 900 mg via INTRAVENOUS
  Filled 2017-10-16: qty 50

## 2017-10-16 MED ORDER — MORPHINE SULFATE (PF) 4 MG/ML IV SOLN
4.0000 mg | Freq: Once | INTRAVENOUS | Status: AC
  Administered 2017-10-16: 4 mg via INTRAVENOUS

## 2017-10-16 NOTE — ED Notes (Signed)
Patient transported to Ultrasound 

## 2017-10-16 NOTE — ED Triage Notes (Signed)
Per EMS, pt reports lower abd pain and pelvic pain for the last 2 days. Pt states hx of PID, denies being on current medication. Per pt, LMP was 2 weeks ago. Pt states pain is constant. Pt A&O at this time.

## 2017-10-16 NOTE — ED Provider Notes (Signed)
[facility] Emergency Department Provider Note   ____________________________________________   First MD Initiated Contact with Patient 10/16/17 [phone]     (approximate)  I have reviewed the triage vital signs and the nursing notes.   HISTORY  Chief Complaint Abdominal Pain    HPI Nicole Gross is a 32 y.o. female who comes into the hospital today with some right-sided lower abdominal pain.  The patient states that the pain started 2 days ago.  The patient has had this before when she had PID last year.  The patient denies any vaginal discharge but has had some bleeding that started 2 days ago.  The patient's last menstrual period was 2 weeks ago.  She is been taking Tylenol at home without any relief.  She denies any nausea vomiting or fevers.  The patient rates her pain a 10 out of 10 in intensity currently.  She is here today for evaluation of her symptoms.  The patient has had 2 ectopic pregnancies in the past.   Past Medical History:  Diagnosis Date  . Asthma   . Ectopic pregnancy    x 2    Patient Active Problem List   Diagnosis Date Noted  . PID (acute pelvic inflammatory disease) 07/03/2016  . Female pelvic inflammatory disease 09/17/2015  . Leukocytosis 09/17/2015  . History of gastrointestinal bleeding 09/17/2015  . Anemia 09/17/2015  . Pyuria 09/17/2015  . Tobacco abuse 09/17/2015  . Epiploic appendagitis 09/15/2015    Past Surgical History:  Procedure Laterality Date  . ECTOPIC PREGNANCY SURGERY     h/o 1 ectopic on each side    Prior to Admission medications   Medication Sig Start Date End Date Taking? Authorizing Provider  doxycycline (VIBRA-TABS) 100 MG tablet Take 1 tablet (100 mg total) by mouth 2 (two) times daily. 10/16/17   Loney Hering, MD  HYDROcodone-acetaminophen (NORCO/VICODIN) 5-325 MG tablet Take 1-2 tablets by mouth every 6 (six) hours as needed for moderate pain. Patient not taking: Reported on 10/16/2017  07/04/16   Rubie Maid, MD  ibuprofen (ADVIL,MOTRIN) 800 MG tablet Take 1 tablet (800 mg total) by mouth every 8 (eight) hours as needed. Patient not taking: Reported on 10/16/2017 07/04/16   Rubie Maid, MD  meloxicam (MOBIC) 15 MG tablet Take 1 tablet (15 mg total) by mouth daily. Patient not taking: Reported on 10/16/2017 08/02/17 08/02/18  Versie Starks, PA-C  ondansetron (ZOFRAN ODT) 4 MG disintegrating tablet Take 1 tablet (4 mg total) by mouth every 8 (eight) hours as needed for nausea or vomiting. 10/16/17   Loney Hering, MD  oxyCODONE-acetaminophen (PERCOCET/ROXICET) 5-325 MG tablet Take 2 tablets by mouth every 4 (four) hours as needed for severe pain. 10/16/17   Loney Hering, MD    Allergies Amoxicillin; Ciprofloxacin; and Penicillins  Family History  Problem Relation Age of Onset  . Hypertension Mother   . Diabetes Mother   . Hypertension Father   . Diabetes Father     Social History Social History   Tobacco Use  . Smoking status: Current Every Day Smoker    Packs/day: 0.50    Years: 10.00    Pack years: 5.00    Types: Cigarettes  . Smokeless tobacco: Never Used  Substance Use Topics  . Alcohol use: No  . Drug use: No    Review of Systems  Constitutional: No fever/chills Eyes: No visual changes. ENT: No sore throat. Cardiovascular: Denies chest pain. Respiratory: Denies shortness of breath. Gastrointestinal:  abdominal  pain.  No nausea, no vomiting.  Genitourinary: Negative for dysuria. Musculoskeletal: Negative for back pain. Skin: Negative for rash. Neurological: Negative for headaches   ____________________________________________   PHYSICAL EXAM:  VITAL SIGNS: ED Triage Vitals  Enc Vitals Group     BP 10/16/17 0439 (!) 140/97     Pulse Rate 10/16/17 0439 78     Resp 10/16/17 0439 20     Temp 10/16/17 0439 98.2 F (36.8 C)     Temp Source 10/16/17 0439 Oral     SpO2 10/16/17 0439 98 %     Weight 10/16/17 0440 150 lb (68 kg)      Height 10/16/17 0440 4\' 11"  (1.499 m)     Head Circumference --      Peak Flow --      Pain Score 10/16/17 0440 10     Pain Loc --      Pain Edu? --      Excl. in [place]? --     Constitutional: Alert and oriented. Well appearing and in  distress. Eyes: Conjunctivae are normal. PERRL. EOMI. Head: Atraumatic. Nose: No congestion/rhinnorhea. Mouth/Throat: Mucous membranes are moist.  Oropharynx non-erythematous. Cardiovascular: Normal rate, regular rhythm. Grossly normal heart sounds.  Good peripheral circulation. Respiratory: Normal respiratory effort.  No retractions. Lungs CTAB. Gastrointestinal: Soft with right lower quadrant abdominal pain. No distention.  Positive bowel sounds Genitourinary: Normal external genitalia, cervical motion tenderness with some right adnexal tenderness to palpation, some purulence seen in the cervical os but no other discharge noted. Musculoskeletal: No lower extremity tenderness nor edema.   Neurologic:  Normal speech and language.  Skin:  Skin is warm, dry and intact.  Psychiatric: Mood and affect are normal.   ____________________________________________   LABS (all labs ordered are listed, but only abnormal results are displayed)  Labs Reviewed  WET PREP, GENITAL - Abnormal; Notable for the following components:      Result Value   WBC, Wet Prep HPF POC FEW (*)    All other components within normal limits  CHLAMYDIA/NGC RT PCR ([facility] ONLY) - Abnormal; Notable for the following components:   Chlamydia Tr DETECTED (*)    N gonorrhoeae DETECTED (*)    All other components within normal limits  CBC - Abnormal; Notable for the following components:   WBC 12.5 (*)    All other components within normal limits  COMPREHENSIVE METABOLIC PANEL - Abnormal; Notable for the following components:   Calcium 8.4 (*)    Total Protein 5.8 (*)    Albumin 3.2 (*)    ALT 10 (*)    Anion gap 3 (*)    All other components within normal limits  HCG,  QUANTITATIVE, PREGNANCY  POCT PREGNANCY, URINE   ____________________________________________  EKG  none ____________________________________________  RADIOLOGY  ED MD interpretation:  US pelvis: Uterine leiomyomas present on prior study, 1 of these lipomas is slightly larger compared to prior study, no endometrial thickening, ovaries appear normal bilaterally, small amount of free fluid in cul-de-sac.  Official radiology report(s): US Pelvis Transvanginal Non-ob (tv Only)  Result Date: 10/16/2017 CLINICAL DATA:  Pelvic pain EXAM: TRANSABDOMINAL AND TRANSVAGINAL ULTRASOUND OF PELVIS DOPPLER ULTRASOUND OF OVARIES TECHNIQUE: Study was performed transabdominally to optimize pelvic field of view evaluation and transvaginally to optimize internal visceral architecture evaluation. Color and duplex Doppler ultrasound was utilized to evaluate blood flow to the ovaries. COMPARISON:  July 03, 2016 FINDINGS: Uterus Measurements: 8.1 x 4.1 x 4.8 cm. There is a 1.7 x 1.7  x 1.6 cm hypoechoic mass along the posterosuperior fundal region toward the right. There is a nearby 1.7 x 1.5 x 1.4 cm hypoechoic mass posteriorly toward the right. A third hypoechoic mass is seen more inferiorly toward the left measuring 1.9 x 1.8 x 1.8 cm. These masses are felt to represent leiomyomas. These leiomyomas were also present on prior study. Endometrium Thickness: 3 mm.  No focal abnormality visualized. Right ovary Measurements: 3.4 x 2.7 x 2.5 cm. Normal appearance/no adnexal mass. Left ovary Measurements: 3.6 x 1.7 x 2.6 cm. Normal appearance/no adnexal mass. Pulsed Doppler evaluation of both ovaries demonstrates normal low-resistance arterial and venous waveforms. Other findings Small amount of free fluid. IMPRESSION: 1. Uterine leiomyomas, present on prior study. One of these leiomyomas is slightly larger compared to prior study. No new intrauterine lesion evident. 2.   No endometrial thickening. 3.  Ovaries appear normal  bilaterally. 4. Small amount of free fluid in cul-de-sac. Question recent ovarian cyst rupture. Electronically Signed   By: Lowella Grip III M.D.   On: 10/16/2017 07:17   US Pelvis Complete  Result Date: 10/16/2017 CLINICAL DATA:  Pelvic pain EXAM: TRANSABDOMINAL AND TRANSVAGINAL ULTRASOUND OF PELVIS DOPPLER ULTRASOUND OF OVARIES TECHNIQUE: Study was performed transabdominally to optimize pelvic field of view evaluation and transvaginally to optimize internal visceral architecture evaluation. Color and duplex Doppler ultrasound was utilized to evaluate blood flow to the ovaries. COMPARISON:  July 03, 2016 FINDINGS: Uterus Measurements: 8.1 x 4.1 x 4.8 cm. There is a 1.7 x 1.7 x 1.6 cm hypoechoic mass along the posterosuperior fundal region toward the right. There is a nearby 1.7 x 1.5 x 1.4 cm hypoechoic mass posteriorly toward the right. A third hypoechoic mass is seen more inferiorly toward the left measuring 1.9 x 1.8 x 1.8 cm. These masses are felt to represent leiomyomas. These leiomyomas were also present on prior study. Endometrium Thickness: 3 mm.  No focal abnormality visualized. Right ovary Measurements: 3.4 x 2.7 x 2.5 cm. Normal appearance/no adnexal mass. Left ovary Measurements: 3.6 x 1.7 x 2.6 cm. Normal appearance/no adnexal mass. Pulsed Doppler evaluation of both ovaries demonstrates normal low-resistance arterial and venous waveforms. Other findings Small amount of free fluid. IMPRESSION: 1. Uterine leiomyomas, present on prior study. One of these leiomyomas is slightly larger compared to prior study. No new intrauterine lesion evident. 2.   No endometrial thickening. 3.  Ovaries appear normal bilaterally. 4. Small amount of free fluid in cul-de-sac. Question recent ovarian cyst rupture. Electronically Signed   By: Lowella Grip III M.D.   On: 10/16/2017 07:17   US Abdominal Pelvic Art/vent Flow Doppler  Result Date: 10/16/2017 CLINICAL DATA:  Pelvic pain EXAM: TRANSABDOMINAL AND  TRANSVAGINAL ULTRASOUND OF PELVIS DOPPLER ULTRASOUND OF OVARIES TECHNIQUE: Study was performed transabdominally to optimize pelvic field of view evaluation and transvaginally to optimize internal visceral architecture evaluation. Color and duplex Doppler ultrasound was utilized to evaluate blood flow to the ovaries. COMPARISON:  July 03, 2016 FINDINGS: Uterus Measurements: 8.1 x 4.1 x 4.8 cm. There is a 1.7 x 1.7 x 1.6 cm hypoechoic mass along the posterosuperior fundal region toward the right. There is a nearby 1.7 x 1.5 x 1.4 cm hypoechoic mass posteriorly toward the right. A third hypoechoic mass is seen more inferiorly toward the left measuring 1.9 x 1.8 x 1.8 cm. These masses are felt to represent leiomyomas. These leiomyomas were also present on prior study. Endometrium Thickness: 3 mm.  No focal abnormality visualized. Right ovary Measurements: 3.4  x 2.7 x 2.5 cm. Normal appearance/no adnexal mass. Left ovary Measurements: 3.6 x 1.7 x 2.6 cm. Normal appearance/no adnexal mass. Pulsed Doppler evaluation of both ovaries demonstrates normal low-resistance arterial and venous waveforms. Other findings Small amount of free fluid. IMPRESSION: 1. Uterine leiomyomas, present on prior study. One of these leiomyomas is slightly larger compared to prior study. No new intrauterine lesion evident. 2.   No endometrial thickening. 3.  Ovaries appear normal bilaterally. 4. Small amount of free fluid in cul-de-sac. Question recent ovarian cyst rupture. Electronically Signed   By: Lowella Grip III M.D.   On: 10/16/2017 07:17    ____________________________________________   PROCEDURES  Procedure(s) performed: None  Procedures  Critical Care performed: No  ____________________________________________   INITIAL IMPRESSION / ASSESSMENT AND PLAN / ED COURSE  As part of my medical decision making, I reviewed the following data within the electronic MEDICAL RECORD NUMBER Notes from prior ED visits and [place]  Controlled Substance Database   This is a 32 year old female who comes into the hospital today with some right-sided abdominal pain.  She had similar pain when she had PID previously.   I will check some blood work to ensure that the patient is not pregnant.  I will then check a pelvic exam and consider if I need to do a CT versus an ultrasound.  The patient did receive an initial dose of morphine and a subsequent dose as she was still in pain.  She will be reassessed once I received some of her results.  The patient's GC and chlamydia both came back positive.  It appears that the patient does have some PID.  I did order some clindamycin and gentamicin for the patient since she is allergic to penicillin.  Her pain is improved after 2 doses of morphine and some Dilaudid.  The patient will be discharged when she is receive her antibiotics.  Since she is having no fevers and no vomiting and her pain is under control I feel that she may be able to manage at home but she was given strict return precautions.      ____________________________________________   FINAL CLINICAL IMPRESSION(S) / ED DIAGNOSES  Final diagnoses:  Abdominal pain  PID (acute pelvic inflammatory disease)     ED Discharge Orders        Ordered    doxycycline (VIBRA-TABS) 100 MG tablet  2 times daily     10/16/17 0819    ondansetron (ZOFRAN ODT) 4 MG disintegrating tablet  Every 8 hours PRN     10/16/17 0819    oxyCODONE-acetaminophen (PERCOCET/ROXICET) 5-325 MG tablet  Every 4 hours PRN     10/16/17 0819       Note:  This document was prepared using Dragon voice recognition software and may include unintentional dictation errors.    Loney Hering, MD 10/16/17 [phone]

## 2017-10-16 NOTE — Discharge Instructions (Signed)
Please follow-up with OB/GYN for further evaluation.  Your ultrasound was unremarkable but should you have any vomiting, fevers, worsening pain please return to the emergency department.

## 2017-12-20 ENCOUNTER — Emergency Department
Admission: EM | Admit: 2017-12-20 | Discharge: 2017-12-20 | Disposition: A | Discharge status: Home / Self Care | Payer: Managed Care, Other (non HMO) | Attending: Emergency Medicine | Admitting: Emergency Medicine

## 2017-12-20 ENCOUNTER — Emergency Department: Payer: Managed Care, Other (non HMO)

## 2017-12-20 ENCOUNTER — Other Ambulatory Visit: Payer: Self-pay

## 2017-12-20 ENCOUNTER — Encounter: Payer: Self-pay | Admitting: Intensive Care

## 2017-12-20 DIAGNOSIS — J4521 Mild intermittent asthma with (acute) exacerbation: Secondary | ICD-10-CM | POA: Diagnosis not present

## 2017-12-20 DIAGNOSIS — F17210 Nicotine dependence, cigarettes, uncomplicated: Secondary | ICD-10-CM | POA: Insufficient documentation

## 2017-12-20 DIAGNOSIS — Z79899 Other long term (current) drug therapy: Secondary | ICD-10-CM | POA: Insufficient documentation

## 2017-12-20 DIAGNOSIS — R079 Chest pain, unspecified: Secondary | ICD-10-CM | POA: Diagnosis present

## 2017-12-20 DIAGNOSIS — R0789 Other chest pain: Secondary | ICD-10-CM | POA: Insufficient documentation

## 2017-12-20 LAB — BASIC METABOLIC PANEL
Anion gap: 5 (ref 5–15)
BUN: 9 mg/dL (ref 6–20)
CALCIUM: 8.1 mg/dL — AB (ref 8.9–10.3)
CO2: 23 mmol/L (ref 22–32)
CREATININE: 0.84 mg/dL (ref 0.44–1.00)
Chloride: 110 mmol/L (ref 101–111)
GFR calc Af Amer: >60 mL/min (ref >60)
Glucose, Bld: 105 mg/dL — ABNORMAL HIGH (ref 65–99)
Potassium: 3.3 mmol/L — ABNORMAL LOW (ref 3.5–5.1)
SODIUM: 138 mmol/L (ref 135–145)

## 2017-12-20 LAB — CBC
HCT: 41 % (ref 35.0–47.0)
Hemoglobin: 13.7 g/dL (ref 12.0–16.0)
MCH: 30.8 pg (ref 26.0–34.0)
MCHC: 33.4 g/dL (ref 32.0–36.0)
MCV: 92.2 fL (ref 80.0–100.0)
PLATELETS: 320 10*3/uL (ref 150–440)
RBC: 4.45 MIL/uL (ref 3.80–5.20)
RDW: 13.3 % (ref 11.5–14.5)
WBC: 9.1 10*3/uL (ref 3.6–11.0)

## 2017-12-20 LAB — TROPONIN I: Troponin I: <0.03 ng/mL (ref <0.03)

## 2017-12-20 MED ORDER — IPRATROPIUM-ALBUTEROL 0.5-2.5 (3) MG/3ML IN SOLN
3.0000 mL | Freq: Once | RESPIRATORY_TRACT | Status: AC
  Administered 2017-12-20: 3 mL via RESPIRATORY_TRACT
  Filled 2017-12-20: qty 3

## 2017-12-20 MED ORDER — SPACER/AERO CHAMBER MOUTHPIECE MISC: 1.0000 [IU] | 0 refills | Status: DC | PRN

## 2017-12-20 MED ORDER — ALBUTEROL SULFATE HFA 108 (90 BASE) MCG/ACT IN AERS: 2.0000 | INHALATION_SPRAY | Freq: Four times a day (QID) | RESPIRATORY_TRACT | 0 refills | Status: DC | PRN

## 2017-12-20 MED ORDER — PREDNISONE 50 MG PO TABS: 50.0000 mg | ORAL_TABLET | Freq: Every day | ORAL | 0 refills | Status: AC

## 2017-12-20 MED ORDER — PREDNISONE 20 MG PO TABS
60.0000 mg | ORAL_TABLET | Freq: Once | ORAL | Status: AC
  Administered 2017-12-20: 60 mg via ORAL
  Filled 2017-12-20: qty 6

## 2017-12-20 MED ORDER — OXYMETAZOLINE HCL 0.05 % NA SOLN
1.0000 | Freq: Once | NASAL | Status: AC
  Administered 2017-12-20: 1 via NASAL
  Filled 2017-12-20: qty 15

## 2017-12-20 NOTE — ED Triage Notes (Signed)
Patient c/o chest tightness that started yesterday while getting ready for work. C/o sob with chest tightness. Current everyday smoker. HX asthma. A&O x4. Ambulatory with no problems

## 2017-12-20 NOTE — ED Notes (Signed)
Pt chest tender with palpation around sternum

## 2017-12-20 NOTE — ED Provider Notes (Signed)
[facility] Emergency Department Provider Note  ____________________________________________   First MD Initiated Contact with Patient 12/20/17 [phone]     (approximate)  I have reviewed the triage vital signs and the nursing notes.   HISTORY  Chief Complaint Chest Pain   HPI Nicole Gross is a 32 y.o. female presents to the emergency department with chest tightness and shortness of breath that began yesterday.  Symptoms began gradually slowly progressive are now intermittent.  Worse with exertion improved with rest.  Not particularly "pain".  Symptoms are nonradiating.  She does have past medical history of asthma and is out of her albuterol.  She has had an upper respiratory tract infection recently.  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  No fevers or chills.  Past Medical History:  Diagnosis Date  . Asthma   . Ectopic pregnancy    x 2    Patient Active Problem List   Diagnosis Date Noted  . PID (acute pelvic inflammatory disease) 07/03/2016  . Female pelvic inflammatory disease 09/17/2015  . Leukocytosis 09/17/2015  . History of gastrointestinal bleeding 09/17/2015  . Anemia 09/17/2015  . Pyuria 09/17/2015  . Tobacco abuse 09/17/2015  . Epiploic appendagitis 09/15/2015    Past Surgical History:  Procedure Laterality Date  . ECTOPIC PREGNANCY SURGERY     h/o 1 ectopic on each side    Prior to Admission medications   Medication Sig Start Date End Date Taking? Authorizing Provider  albuterol (PROVENTIL HFA;VENTOLIN HFA) 108 (90 Base) MCG/ACT inhaler Inhale 2 puffs into the lungs every 6 (six) hours as needed for wheezing or shortness of breath. 12/20/17   Darel Hong, MD  doxycycline (VIBRA-TABS) 100 MG tablet Take 1 tablet (100 mg total) by mouth 2 (two) times daily. 10/16/17   Loney Hering, MD  HYDROcodone-acetaminophen (NORCO/VICODIN) 5-325 MG tablet Take 1-2 tablets by mouth every 6 (six) hours as  needed for moderate pain. Patient not taking: Reported on 10/16/2017 07/04/16   Rubie Maid, MD  ibuprofen (ADVIL,MOTRIN) 800 MG tablet Take 1 tablet (800 mg total) by mouth every 8 (eight) hours as needed. Patient not taking: Reported on 10/16/2017 07/04/16   Rubie Maid, MD  meloxicam (MOBIC) 15 MG tablet Take 1 tablet (15 mg total) by mouth daily. Patient not taking: Reported on 10/16/2017 08/02/17 08/02/18  Versie Starks, PA-C  ondansetron (ZOFRAN ODT) 4 MG disintegrating tablet Take 1 tablet (4 mg total) by mouth every 8 (eight) hours as needed for nausea or vomiting. 10/16/17   Loney Hering, MD  oxyCODONE-acetaminophen (PERCOCET/ROXICET) 5-325 MG tablet Take 2 tablets by mouth every 4 (four) hours as needed for severe pain. 10/16/17   Loney Hering, MD  predniSONE (DELTASONE) 50 MG tablet Take 1 tablet (50 mg total) by mouth daily for 4 days. 12/20/17 12/24/17  Darel Hong, MD  Spacer/Aero Chamber Mouthpiece MISC 1 Units by Does not apply route every 4 (four) hours as needed (wheezing). 12/20/17   Darel Hong, MD    Allergies Amoxicillin; Ciprofloxacin; Penicillins; and Codeine  Family History  Problem Relation Age of Onset  . Hypertension Mother   . Diabetes Mother   . Hypertension Father   . Diabetes Father     Social History Social History   Tobacco Use  . Smoking status: Current Every Day Smoker    Packs/day: 0.50    Years: 10.00    Pack years: 5.00    Types: Cigarettes  .  Smokeless tobacco: Never Used  Substance Use Topics  . Alcohol use: Yes    Comment: occ  . Drug use: No    Review of Systems Constitutional: No fever/chills Eyes: No visual changes. ENT: No sore throat. Cardiovascular: Positive for chest pain. Respiratory: Positive for shortness of breath. Gastrointestinal: No abdominal pain.  No nausea, no vomiting.  No diarrhea.  No constipation. Genitourinary: Negative for dysuria. Musculoskeletal: Negative for back pain. Skin: Negative for  rash. Neurological: Negative for headaches, focal weakness or numbness.   ____________________________________________   PHYSICAL EXAM:  VITAL SIGNS: ED Triage Vitals  Enc Vitals Group     BP 12/20/17 0903 116/69     Pulse Rate 12/20/17 0903 80     Resp 12/20/17 0903 16     Temp 12/20/17 0903 97.8 F (36.6 C)     Temp Source 12/20/17 0903 Oral     SpO2 12/20/17 0903 96 %     Weight 12/20/17 0904 149 lb (67.6 kg)     Height 12/20/17 0904 4\' 11"  (1.499 m)     Head Circumference --      Peak Flow --      Pain Score 12/20/17 0904 7     Pain Loc --      Pain Edu? --      Excl. in [place]? --     Constitutional: Alert and oriented x4 pleasant cooperative speaks in full clear sentences no diaphoresis Eyes: PERRL EOMI. Head: Atraumatic. Nose: Positive for congestion Mouth/Throat: No trismus Neck: No stridor.   Cardiovascular: Normal rate, regular rhythm. Grossly normal heart sounds.  Good peripheral circulation. Respiratory: Slightly increased respiratory effort with mild wheeze throughout Gastrointestinal: Soft nontender Musculoskeletal: No lower extremity edema   Neurologic:  Normal speech and language. No gross focal neurologic deficits are appreciated. Skin:  Skin is warm, dry and intact. No rash noted. Psychiatric: Mood and affect are normal. Speech and behavior are normal.    ____________________________________________   DIFFERENTIAL includes but not limited to  Asthma exacerbation, pneumonia, pneumothorax, pulmonary embolism, upper respiratory tract infection, acute coronary syndrome ____________________________________________   LABS (all labs ordered are listed, but only abnormal results are displayed)  Labs Reviewed  BASIC METABOLIC PANEL - Abnormal; Notable for the following components:      Result Value   Potassium 3.3 (*)    Glucose, Bld 105 (*)    Calcium 8.1 (*)    All other components within normal limits  CBC  TROPONIN I    Lab work reviewed by  me with no signs of acute ischemia __________________________________________  EKG  ED ECG REPORT I, Darel Hong, the attending physician, personally viewed and interpreted this ECG.  Date: 12/20/2017 EKG Time:  Rate: 75 Rhythm: normal sinus rhythm QRS Axis: normal Intervals: normal ST/T Wave abnormalities: normal Narrative Interpretation: no evidence of acute ischemia  ____________________________________________  RADIOLOGY  Chest x-ray reviewed by me with no acute disease ____________________________________________   PROCEDURES  Procedure(s) performed: no  Procedures  Critical Care performed: no  Observation: no ____________________________________________   INITIAL IMPRESSION / ASSESSMENT AND PLAN / ED COURSE  Pertinent labs & imaging results that were available during my care of the patient were reviewed by me and considered in my medical decision making (see chart for details).  The patient arrives hemodynamically stable and relatively well-appearing.  Slightly low pulse ox about 96.  After several nebulizations the patient feels improved.  She likely has an upper respiratory tract infection leading to asthma exacerbation.  I will treat her with a total of 4 more days of steroids.  Discharged home in improved condition she verbalizes understanding and agree with plan.      ____________________________________________   FINAL CLINICAL IMPRESSION(S) / ED DIAGNOSES  Final diagnoses:  Atypical chest pain  Mild intermittent asthma with exacerbation      NEW MEDICATIONS STARTED DURING THIS VISIT:  Discharge Medication List as of 12/20/2017 11:58 AM    START taking these medications   Details  albuterol (PROVENTIL HFA;VENTOLIN HFA) 108 (90 Base) MCG/ACT inhaler Inhale 2 puffs into the lungs every 6 (six) hours as needed for wheezing or shortness of breath., Starting Thu 12/20/2017, Print    predniSONE (DELTASONE) 50 MG tablet Take 1 tablet (50 mg  total) by mouth daily for 4 days., Starting Thu 12/20/2017, Until Mon 12/24/2017, Print    Spacer/Aero Chamber Mouthpiece MISC 1 Units by Does not apply route every 4 (four) hours as needed (wheezing)., Starting Thu 12/20/2017, Print         Note:  This document was prepared using Dragon voice recognition software and may include unintentional dictation errors.     Darel Hong, MD 12/22/17 1316

## 2017-12-20 NOTE — Discharge Instructions (Signed)
Fortunately today your chest x-ray and your blood work were reassuring.  Please take all of your steroids as prescribed however if your symptoms do not dramatically improve within the next 2 days follow-up with your primary care physician for recheck.  Return to the emergency department sooner for any concerns whatsoever.  It was a pleasure to take care of you today, and thank you for coming to our emergency department.  If you have any questions or concerns before leaving please ask the nurse to grab me and I'm more than happy to go through your aftercare instructions again.  If you were prescribed any opioid pain medication today such as Norco, Vicodin, Percocet, morphine, hydrocodone, or oxycodone please make sure you do not drive when you are taking this medication as it can alter your ability to drive safely.  If you have any concerns once you are home that you are not improving or are in fact getting worse before you can make it to your follow-up appointment, please do not hesitate to call 911 and come back for further evaluation.  Darel Hong, MD  Results for orders placed or performed during the hospital encounter of 93/81/82  Basic metabolic panel  Result Value Ref Range   Sodium 138 135 - 145 mmol/L   Potassium 3.3 (L) 3.5 - 5.1 mmol/L   Chloride 110 101 - 111 mmol/L   CO2 23 22 - 32 mmol/L   Glucose, Bld 105 (H) 65 - 99 mg/dL   BUN 9 6 - 20 mg/dL   Creatinine, Ser 0.84 0.44 - 1.00 mg/dL   Calcium 8.1 (L) 8.9 - 10.3 mg/dL   GFR calc non Af Amer >60 >60 mL/min   GFR calc Af Amer >60 >60 mL/min   Anion gap 5 5 - 15  CBC  Result Value Ref Range   WBC 9.1 3.6 - 11.0 K/uL   RBC 4.45 3.80 - 5.20 MIL/uL   Hemoglobin 13.7 12.0 - 16.0 g/dL   HCT 41.0 35.0 - 47.0 %   MCV 92.2 80.0 - 100.0 fL   MCH 30.8 26.0 - 34.0 pg   MCHC 33.4 32.0 - 36.0 g/dL   RDW 13.3 11.5 - 14.5 %   Platelets 320 150 - 440 K/uL  Troponin I  Result Value Ref Range   Troponin I <0.03 <0.03 ng/mL   Dg  Chest 2 View  Result Date: 12/20/2017 CLINICAL DATA:  Chest pain. EXAM: CHEST - 2 VIEW COMPARISON:  05/31/2015 FINDINGS: Normal heart size. No pleural effusions or edema. Mild bronchial wall thickening noted with coarsened interstitial markings noted in the left base. No superimposed airspace consolidation. The visualized osseous structures are unremarkable. IMPRESSION: No acute cardiopulmonary abnormalities. Mild coarsened interstitial markings noted which may reflect chronic smoking related changes. Electronically Signed   By: Kerby Moors M.D.   On: 12/20/2017 09:40

## 2017-12-26 ENCOUNTER — Other Ambulatory Visit: Payer: Self-pay

## 2017-12-26 DIAGNOSIS — J45909 Unspecified asthma, uncomplicated: Secondary | ICD-10-CM | POA: Insufficient documentation

## 2017-12-26 DIAGNOSIS — Z79899 Other long term (current) drug therapy: Secondary | ICD-10-CM | POA: Diagnosis not present

## 2017-12-26 DIAGNOSIS — F17210 Nicotine dependence, cigarettes, uncomplicated: Secondary | ICD-10-CM | POA: Insufficient documentation

## 2017-12-26 DIAGNOSIS — M25572 Pain in left ankle and joints of left foot: Secondary | ICD-10-CM | POA: Insufficient documentation

## 2017-12-26 NOTE — ED Triage Notes (Signed)
Pt in with co left ankle swelling and  Pain since yesterday morning. Denies any injury, hx of the same to right ankle. States was seen here for the same and given mobic.

## 2017-12-27 ENCOUNTER — Emergency Department: Payer: Managed Care, Other (non HMO)

## 2017-12-27 ENCOUNTER — Emergency Department
Admission: EM | Admit: 2017-12-27 | Discharge: 2017-12-27 | Disposition: A | Discharge status: Home / Self Care | Payer: Managed Care, Other (non HMO) | Attending: Emergency Medicine | Admitting: Emergency Medicine

## 2017-12-27 DIAGNOSIS — M25572 Pain in left ankle and joints of left foot: Secondary | ICD-10-CM

## 2017-12-27 MED ORDER — OXYCODONE-ACETAMINOPHEN 5-325 MG PO TABS
1.0000 | ORAL_TABLET | Freq: Once | ORAL | Status: AC
  Administered 2017-12-27: 1 via ORAL
  Filled 2017-12-27: qty 1

## 2017-12-27 MED ORDER — IBUPROFEN 800 MG PO TABS: 800.0000 mg | ORAL_TABLET | Freq: Three times a day (TID) | ORAL | 0 refills | Status: DC | PRN

## 2017-12-27 NOTE — ED Notes (Signed)
Patient c/o left ankle pain/swelling beginning Tuesday morning upon awakening. Patient reports pain radiates to knee.

## 2017-12-27 NOTE — ED Provider Notes (Signed)
[facility] Emergency Department Provider Note    None    (approximate)  I have reviewed the triage vital signs and the nursing notes.   HISTORY  Chief Complaint Ankle Pain   HPI Nicole Gross is a 32 y.o. female with below list of chronic medical conditions presents to the emergency department with nontraumatic left ankle pain times 1 day.  Patient states current pain score 7 out of 10 and worse with any movement of the ankle.  Patient denies any fever.  Past Medical History:  Diagnosis Date  . Asthma   . Ectopic pregnancy    x 2    Patient Active Problem List   Diagnosis Date Noted  . PID (acute pelvic inflammatory disease) 07/03/2016  . Female pelvic inflammatory disease 09/17/2015  . Leukocytosis 09/17/2015  . History of gastrointestinal bleeding 09/17/2015  . Anemia 09/17/2015  . Pyuria 09/17/2015  . Tobacco abuse 09/17/2015  . Epiploic appendagitis 09/15/2015    Past Surgical History:  Procedure Laterality Date  . ECTOPIC PREGNANCY SURGERY     h/o 1 ectopic on each side    Prior to Admission medications   Medication Sig Start Date End Date Taking? Authorizing Provider  albuterol (PROVENTIL HFA;VENTOLIN HFA) 108 (90 Base) MCG/ACT inhaler Inhale 2 puffs into the lungs every 6 (six) hours as needed for wheezing or shortness of breath. 12/20/17   Darel Hong, MD  doxycycline (VIBRA-TABS) 100 MG tablet Take 1 tablet (100 mg total) by mouth 2 (two) times daily. 10/16/17   Loney Hering, MD  HYDROcodone-acetaminophen (NORCO/VICODIN) 5-325 MG tablet Take 1-2 tablets by mouth every 6 (six) hours as needed for moderate pain. Patient not taking: Reported on 10/16/2017 07/04/16   Rubie Maid, MD  ibuprofen (ADVIL,MOTRIN) 800 MG tablet Take 1 tablet (800 mg total) by mouth every 8 (eight) hours as needed. Patient not taking: Reported on 10/16/2017 07/04/16   Rubie Maid, MD  ibuprofen (ADVIL,MOTRIN) 800 MG tablet Take 1 tablet (800 mg  total) by mouth every 8 (eight) hours as needed. 12/27/17   Gregor Hams, MD  meloxicam (MOBIC) 15 MG tablet Take 1 tablet (15 mg total) by mouth daily. Patient not taking: Reported on 10/16/2017 08/02/17 08/02/18  Versie Starks, PA-C  ondansetron (ZOFRAN ODT) 4 MG disintegrating tablet Take 1 tablet (4 mg total) by mouth every 8 (eight) hours as needed for nausea or vomiting. 10/16/17   Loney Hering, MD  oxyCODONE-acetaminophen (PERCOCET/ROXICET) 5-325 MG tablet Take 2 tablets by mouth every 4 (four) hours as needed for severe pain. 10/16/17   Loney Hering, MD  Spacer/Aero Chamber Mouthpiece MISC 1 Units by Does not apply route every 4 (four) hours as needed (wheezing). 12/20/17   Darel Hong, MD    Allergies Amoxicillin; Ciprofloxacin; Penicillins; and Codeine  Family History  Problem Relation Age of Onset  . Hypertension Mother   . Diabetes Mother   . Hypertension Father   . Diabetes Father     Social History Social History   Tobacco Use  . Smoking status: Current Every Day Smoker    Packs/day: 0.50    Years: 10.00    Pack years: 5.00    Types: Cigarettes  . Smokeless tobacco: Never Used  Substance Use Topics  . Alcohol use: Yes    Comment: occ  . Drug use: No    Review of Systems Constitutional: No fever/chills Eyes: No visual changes. ENT: No sore throat. Cardiovascular: Denies chest pain. Respiratory: Denies shortness  of breath. Gastrointestinal: No abdominal pain.  No nausea, no vomiting.  No diarrhea.  No constipation. Genitourinary: Negative for dysuria. Musculoskeletal: Negative for neck pain.  Negative for back pain.  Positive for left ankle pain Integumentary: Negative for rash. Neurological: Negative for headaches, focal weakness or numbness.   ____________________________________________   PHYSICAL EXAM:  VITAL SIGNS: ED Triage Vitals [12/26/17 2255]  Enc Vitals Group     BP 122/73     Pulse Rate 83     Resp 18     Temp 97.7 F  (36.5 C)     Temp Source Oral     SpO2 99 %     Weight 68 kg (150 lb)     Height 1.499 m (4\' 11" )     Head Circumference      Peak Flow      Pain Score 8     Pain Loc      Pain Edu?      Excl. in [place]?     Constitutional: Alert and oriented. Well appearing and in no acute distress. Eyes: Conjunctivae are normal.  Head: Atraumatic. Mouth/Throat: Mucous membranes are moist. Oropharynx non-erythematous. Neck: No stridor.   Musculoskeletal: Pain with palpation left medial malleoli and inferiorly as well.  Swelling noted to the area.  Pain with active and passive range of motion of the ankle. Neurologic:  Normal speech and language. No gross focal neurologic deficits are appreciated.  Skin:  Skin is warm, dry and intact. No rash noted. Psychiatric: Mood and affect are normal. Speech and behavior are normal.  ____________________________________________   RADIOLOGY I, Pala N Riker Collier, personally viewed and evaluated these images (plain radiographs) as part of my medical decision making, as well as reviewing the written report by the radiologist.  ED MD interpretation: Slight nonspecific soft tissue swelling noted on ankle x-ray.  Official radiology report(s): Dg Ankle Complete Left  Result Date: 12/27/2017 CLINICAL DATA:  Left ankle pain and swelling without known injury since yesterday. EXAM: LEFT ANKLE COMPLETE - 3+ VIEW COMPARISON:  None. FINDINGS: Mild soft tissue swelling about the malleoli more so laterally. Intact ankle mortise and base of fifth metatarsal. No acute fracture or joint dislocation. IMPRESSION: Slight nonspecific soft tissue swelling or edema without acute underlying osseous abnormality. Electronically Signed   By: Ashley Royalty M.D.   On: 12/27/2017 02:12   Dg Foot Complete Left  Result Date: 12/27/2017 CLINICAL DATA:  Left foot swelling and pain since yesterday without known injury. EXAM: LEFT FOOT - COMPLETE 3+ VIEW COMPARISON:  None. FINDINGS: There is no  evidence of fracture or dislocation. An accessory ossicle is seen adjacent to the tarsal navicular. There is no evidence of arthropathy or other focal bone abnormality. Soft tissues are unremarkable. IMPRESSION: Negative. Electronically Signed   By: Ashley Royalty M.D.   On: 12/27/2017 02:09     Procedures   ____________________________________________   INITIAL IMPRESSION / ASSESSMENT AND PLAN / ED COURSE  As part of my medical decision making, I reviewed the following data within the electronic MEDICAL RECORD NUMBER   32 year old female presenting with nontraumatic left ankle pain.  X-ray revealed slight soft tissue swelling however no fracture or dislocation.  Considered possibility of tendinitis/bursitis.  Patient will be prescribed ibuprofen for home with recommendation to follow-up with orthopedic surgery    ____________________________________________  FINAL CLINICAL IMPRESSION(S) / ED DIAGNOSES  Final diagnoses:  Acute left ankle pain     MEDICATIONS GIVEN DURING THIS VISIT:  Medications  oxyCODONE-acetaminophen (  PERCOCET/ROXICET) 5-325 MG per tablet 1 tablet (1 tablet Oral Given 12/27/17 0144)     ED Discharge Orders        Ordered    ibuprofen (ADVIL,MOTRIN) 800 MG tablet  Every 8 hours PRN     12/27/17 0416       Note:  This document was prepared using Dragon voice recognition software and may include unintentional dictation errors.    Gregor Hams, MD 12/27/17 [phone]

## 2017-12-27 NOTE — ED Notes (Signed)
ED Provider at bedside. 

## 2017-12-27 NOTE — ED Notes (Signed)
X-ray at bedside

## 2018-04-03 DIAGNOSIS — E663 Overweight: Secondary | ICD-10-CM | POA: Insufficient documentation

## 2018-04-03 LAB — HM PAP SMEAR: HM Pap smear: NEGATIVE

## 2018-04-03 LAB — HM HIV SCREENING LAB: HM HIV SCREENING: NEGATIVE

## 2018-07-15 ENCOUNTER — Encounter: Payer: Self-pay | Admitting: Emergency Medicine

## 2018-07-15 ENCOUNTER — Emergency Department
Admission: EM | Admit: 2018-07-15 | Discharge: 2018-07-15 | Disposition: A | Discharge status: Home / Self Care | Payer: Managed Care, Other (non HMO) | Attending: Emergency Medicine | Admitting: Emergency Medicine

## 2018-07-15 ENCOUNTER — Other Ambulatory Visit: Payer: Self-pay

## 2018-07-15 DIAGNOSIS — B9789 Other viral agents as the cause of diseases classified elsewhere: Secondary | ICD-10-CM

## 2018-07-15 DIAGNOSIS — J45909 Unspecified asthma, uncomplicated: Secondary | ICD-10-CM | POA: Insufficient documentation

## 2018-07-15 DIAGNOSIS — F17210 Nicotine dependence, cigarettes, uncomplicated: Secondary | ICD-10-CM | POA: Insufficient documentation

## 2018-07-15 DIAGNOSIS — Z79899 Other long term (current) drug therapy: Secondary | ICD-10-CM | POA: Insufficient documentation

## 2018-07-15 DIAGNOSIS — J069 Acute upper respiratory infection, unspecified: Secondary | ICD-10-CM | POA: Insufficient documentation

## 2018-07-15 MED ORDER — BENZONATATE 100 MG PO CAPS: ORAL_CAPSULE | ORAL | 0 refills | Status: DC

## 2018-07-15 MED ORDER — PREDNISONE 20 MG PO TABS: 20.0000 mg | ORAL_TABLET | Freq: Two times a day (BID) | ORAL | 0 refills | Status: AC

## 2018-07-15 MED ORDER — AZITHROMYCIN 250 MG PO TABS: 250.0000 mg | ORAL_TABLET | Freq: Every day | ORAL | 0 refills | Status: AC

## 2018-07-15 MED ORDER — AZITHROMYCIN 500 MG PO TABS
500.0000 mg | ORAL_TABLET | Freq: Once | ORAL | Status: AC
  Administered 2018-07-15: 500 mg via ORAL
  Filled 2018-07-15: qty 1

## 2018-07-15 NOTE — ED Triage Notes (Signed)
Patient ambulatory to triage with steady gait, without difficulty or distress noted, mask in place; pt reports sinus drainage and prod cough green sputum x 3 days

## 2018-07-15 NOTE — ED Notes (Signed)
Reference triage note. Pt states she has runny nose and dry cough for about three days. Pt states she was running fever at home of 104. Pt c/o SOB with cough spells. Pt in NAD at this time.

## 2018-07-15 NOTE — Discharge Instructions (Signed)
You are being treated for a bronchitis and possible pneumonia. Take the antibiotic as directed, starting on Tuesday. Take OTC Delsym and pseudoephedrine for additional symptom relief. Follow-up with your provider or return as needed.

## 2018-07-15 NOTE — ED Notes (Signed)
Initial assessment completed. Call bell within reach. Awaits provider for evaluation at this time.

## 2018-07-15 NOTE — ED Notes (Signed)
Pt feeling better after crackers. Tolerating PO intake at this time. Pt verbalized understanding of d/c instructions and f/u care. No further questions at this time. Pt ambulatory to exit with steady gait.

## 2018-07-15 NOTE — ED Provider Notes (Signed)
[facility] Emergency Department Provider Note ____________________________________________  Time seen: 2159  I have reviewed the triage vital signs and the nursing notes.  HISTORY  Chief Complaint  Cough  HPI Nicole Gross is a 33 y.o. female presents herself to the ED for evaluation of 3-day complaint of sinus drainage and productive cough with green sputum.  Patient reports only mild fevers in the interim.  She also reports some sinus pressure as well as some sore throat related to the cough.  She denies any chest pain, nausea, vomiting, diarrhea, or muscle pain.  She has a history of asthma and uses an inhaler daily.  Past Medical History:  Diagnosis Date  . Asthma   . Ectopic pregnancy    x 2    Patient Active Problem List   Diagnosis Date Noted  . PID (acute pelvic inflammatory disease) 07/03/2016  . Female pelvic inflammatory disease 09/17/2015  . Leukocytosis 09/17/2015  . History of gastrointestinal bleeding 09/17/2015  . Anemia 09/17/2015  . Pyuria 09/17/2015  . Tobacco abuse 09/17/2015  . Epiploic appendagitis 09/15/2015    Past Surgical History:  Procedure Laterality Date  . ECTOPIC PREGNANCY SURGERY     h/o 1 ectopic on each side    Prior to Admission medications   Medication Sig Start Date End Date Taking? Authorizing Provider  albuterol (PROVENTIL HFA;VENTOLIN HFA) 108 (90 Base) MCG/ACT inhaler Inhale 2 puffs into the lungs every 6 (six) hours as needed for wheezing or shortness of breath. 12/20/17   Darel Hong, MD  azithromycin (ZITHROMAX Z-PAK) 250 MG tablet Take 1 tablet (250 mg total) by mouth daily for 4 days. 07/16/18 07/20/18  Brently Voorhis, Dannielle Karvonen, PA-C  benzonatate (TESSALON PERLES) 100 MG capsule Take 1-2 tabs TID prn cough 07/15/18   Jerney Baksh, Dannielle Karvonen, PA-C  doxycycline (VIBRA-TABS) 100 MG tablet Take 1 tablet (100 mg total) by mouth 2 (two) times daily. 10/16/17   Loney Hering, MD  HYDROcodone-acetaminophen  (NORCO/VICODIN) 5-325 MG tablet Take 1-2 tablets by mouth every 6 (six) hours as needed for moderate pain. Patient not taking: Reported on 10/16/2017 07/04/16   Rubie Maid, MD  ibuprofen (ADVIL,MOTRIN) 800 MG tablet Take 1 tablet (800 mg total) by mouth every 8 (eight) hours as needed. Patient not taking: Reported on 10/16/2017 07/04/16   Rubie Maid, MD  ibuprofen (ADVIL,MOTRIN) 800 MG tablet Take 1 tablet (800 mg total) by mouth every 8 (eight) hours as needed. 12/27/17   Gregor Hams, MD  meloxicam (MOBIC) 15 MG tablet Take 1 tablet (15 mg total) by mouth daily. Patient not taking: Reported on 10/16/2017 08/02/17 08/02/18  Versie Starks, PA-C  ondansetron (ZOFRAN ODT) 4 MG disintegrating tablet Take 1 tablet (4 mg total) by mouth every 8 (eight) hours as needed for nausea or vomiting. 10/16/17   Loney Hering, MD  oxyCODONE-acetaminophen (PERCOCET/ROXICET) 5-325 MG tablet Take 2 tablets by mouth every 4 (four) hours as needed for severe pain. 10/16/17   Loney Hering, MD  predniSONE (DELTASONE) 20 MG tablet Take 1 tablet (20 mg total) by mouth 2 (two) times daily with a meal for 5 days. 07/15/18 07/20/18  Elgin Carn, Dannielle Karvonen, PA-C  Spacer/Aero Chamber Mouthpiece MISC 1 Units by Does not apply route every 4 (four) hours as needed (wheezing). 12/20/17   Darel Hong, MD   Allergies Amoxicillin; Ciprofloxacin; Penicillins; and Codeine  Family History  Problem Relation Age of Onset  . Hypertension Mother   . Diabetes Mother   .  Hypertension Father   . Diabetes Father     Social History Social History   Tobacco Use  . Smoking status: Current Every Day Smoker    Packs/day: 0.50    Years: 10.00    Pack years: 5.00    Types: Cigarettes  . Smokeless tobacco: Never Used  Substance Use Topics  . Alcohol use: Yes    Comment: occ  . Drug use: No    Review of Systems  Constitutional: Negative for fever. Eyes: Negative for visual changes. ENT: Negative for sore throat.   Reports sinus congestion and sinus pressure. Cardiovascular: Negative for chest pain. Respiratory: Negative for shortness of breath. Reports productive cough. And wheezing.  Gastrointestinal: Negative for abdominal pain, vomiting and diarrhea. Genitourinary: Negative for dysuria. Musculoskeletal: Negative for back pain. Skin: Negative for rash. Neurological: Negative for headaches, focal weakness or numbness. ____________________________________________  PHYSICAL EXAM:  VITAL SIGNS: ED Triage Vitals [07/15/18 1936]  Enc Vitals Group     BP 128/71     Pulse Rate (!) 101     Resp 18     Temp 100.2 F (37.9 C)     Temp Source Oral     SpO2 100 %     Weight 150 lb (68 kg)     Height 4\' 11"  (1.499 m)     Head Circumference      Peak Flow      Pain Score 0     Pain Loc      Pain Edu?      Excl. in [place]?     Constitutional: Alert and oriented. Well appearing and in no distress. Head: Normocephalic and atraumatic. Eyes: Conjunctivae are normal. PERRL. Normal extraocular movements Ears: Canals clear. TMs intact bilaterally. Nose: No congestion/rhinorrhea/epistaxis. Mouth/Throat: Mucous membranes are moist.  Blood is midline and tonsils are flat.  No oral lesions appreciated. Neck: Supple. No thyromegaly. Hematological/Lymphatic/Immunological: No cervical lymphadenopathy. Cardiovascular: Normal rate, regular rhythm. Normal distal pulses. Respiratory: Normal respiratory effort. No wheezes/rales.  Mild rhonchi noted bilaterally. Gastrointestinal: Soft and nontender. No distention. ____________________________________________   RADIOLOGY  deferred ____________________________________________  PROCEDURES  Procedures Azithromycin 500 mg PO ____________________________________________  INITIAL IMPRESSION / ASSESSMENT AND PLAN / ED COURSE  Patient with ED evaluation of 3-day complaint of sinus pressure and congestion as well as productive cough.  She is presenting today with  mildly elevated temperature but denies any preceding fevers.  Patient's clinical picture is consistent with a likely bronchitis and mild asthma flare.  She will continue with her daily inhaler, but will be discharged with a prescription for a azithromycin to cover for potential bronchitis/community-acquired pneumonia.  Patient is also given prescription for Tessalon Perles and prednisone.  She will take over-the-counter Delsym and pseudoephedrine for additional symptom relief.  Return precautions have been reviewed, and patient verbalizes understanding.  A work note is provided for 2 days as requested. ____________________________________________  FINAL CLINICAL IMPRESSION(S) / ED DIAGNOSES  Final diagnoses:  Viral URI with cough      Nyja Westbrook, Dannielle Karvonen, PA-C 07/15/18 2217    Nena Polio, MD 07/16/18 [phone]

## 2018-07-15 NOTE — ED Notes (Signed)
Pt nauseated and vomiting. Reports " I think it because I haven't eaten all day" Provided with crackers and a ginger-ale at this time.

## 2018-08-15 DIAGNOSIS — E663 Overweight: Secondary | ICD-10-CM

## 2018-10-05 ENCOUNTER — Emergency Department
Admission: EM | Admit: 2018-10-05 | Discharge: 2018-10-05 | Disposition: A | Discharge status: Home / Self Care | Payer: Self-pay | Attending: Emergency Medicine | Admitting: Emergency Medicine

## 2018-10-05 ENCOUNTER — Other Ambulatory Visit: Payer: Self-pay

## 2018-10-05 ENCOUNTER — Emergency Department: Payer: Self-pay

## 2018-10-05 ENCOUNTER — Encounter: Payer: Self-pay | Admitting: Emergency Medicine

## 2018-10-05 DIAGNOSIS — K047 Periapical abscess without sinus: Secondary | ICD-10-CM | POA: Insufficient documentation

## 2018-10-05 DIAGNOSIS — J45909 Unspecified asthma, uncomplicated: Secondary | ICD-10-CM | POA: Insufficient documentation

## 2018-10-05 DIAGNOSIS — F17210 Nicotine dependence, cigarettes, uncomplicated: Secondary | ICD-10-CM | POA: Insufficient documentation

## 2018-10-05 LAB — COMPREHENSIVE METABOLIC PANEL
ALT: 14 U/L (ref 0–44)
AST: 19 U/L (ref 15–41)
Albumin: 3.6 g/dL (ref 3.5–5.0)
Alkaline Phosphatase: 63 U/L (ref 38–126)
Anion gap: 9 (ref 5–15)
BUN: 11 mg/dL (ref 6–20)
CO2: 26 mmol/L (ref 22–32)
Calcium: 8.6 mg/dL — ABNORMAL LOW (ref 8.9–10.3)
Chloride: 103 mmol/L (ref 98–111)
Creatinine, Ser: 0.86 mg/dL (ref 0.44–1.00)
GFR calc Af Amer: >60 mL/min (ref >60)
GFR calc non Af Amer: >60 mL/min (ref >60)
Glucose, Bld: 96 mg/dL (ref 70–99)
Potassium: 4.1 mmol/L (ref 3.5–5.1)
Sodium: 138 mmol/L (ref 135–145)
Total Bilirubin: 1.5 mg/dL — ABNORMAL HIGH (ref 0.3–1.2)
Total Protein: 6.5 g/dL (ref 6.5–8.1)

## 2018-10-05 LAB — CBC WITH DIFFERENTIAL/PLATELET
Abs Immature Granulocytes: 0.05 10*3/uL (ref 0.00–0.07)
Basophils Absolute: 0.1 10*3/uL (ref 0.0–0.1)
Basophils Relative: 0 %
Eosinophils Absolute: 1 10*3/uL — ABNORMAL HIGH (ref 0.0–0.5)
Eosinophils Relative: 7 %
HCT: 41.1 % (ref 36.0–46.0)
Hemoglobin: 13.7 g/dL (ref 12.0–15.0)
Immature Granulocytes: 0 %
Lymphocytes Relative: 23 %
Lymphs Abs: 3.2 10*3/uL (ref 0.7–4.0)
MCH: 30 pg (ref 26.0–34.0)
MCHC: 33.3 g/dL (ref 30.0–36.0)
MCV: 89.9 fL (ref 80.0–100.0)
Monocytes Absolute: 1.2 10*3/uL — ABNORMAL HIGH (ref 0.1–1.0)
Monocytes Relative: 9 %
Neutro Abs: 8.2 10*3/uL — ABNORMAL HIGH (ref 1.7–7.7)
Neutrophils Relative %: 61 %
Platelets: 361 10*3/uL (ref 150–400)
RBC: 4.57 MIL/uL (ref 3.87–5.11)
RDW: 13.3 % (ref 11.5–15.5)
WBC: 13.6 10*3/uL — ABNORMAL HIGH (ref 4.0–10.5)
nRBC: 0 % (ref 0.0–0.2)

## 2018-10-05 LAB — SEDIMENTATION RATE: Sed Rate: 7 mm/hr (ref 0–20)

## 2018-10-05 MED ORDER — CLINDAMYCIN HCL 150 MG PO CAPS
300.0000 mg | ORAL_CAPSULE | Freq: Once | ORAL | Status: AC
  Administered 2018-10-05: 300 mg via ORAL
  Filled 2018-10-05: qty 2

## 2018-10-05 MED ORDER — CLINDAMYCIN HCL 300 MG PO CAPS: 300.0000 mg | ORAL_CAPSULE | Freq: Three times a day (TID) | ORAL | 0 refills | Status: AC

## 2018-10-05 MED ORDER — KETOROLAC TROMETHAMINE 10 MG PO TABS: 10.0000 mg | ORAL_TABLET | Freq: Four times a day (QID) | ORAL | 0 refills | Status: AC | PRN

## 2018-10-05 MED ORDER — DIPHENHYDRAMINE HCL 50 MG/ML IJ SOLN: 25.0000 mg | Freq: Once | INTRAMUSCULAR | Status: DC

## 2018-10-05 MED ORDER — KETOROLAC TROMETHAMINE 30 MG/ML IJ SOLN
30.0000 mg | Freq: Once | INTRAMUSCULAR | Status: AC
  Administered 2018-10-05: 30 mg via INTRAVENOUS
  Filled 2018-10-05: qty 1

## 2018-10-05 NOTE — ED Triage Notes (Signed)
Pt c/o soreness to R side of head that radiates down into her face, pt c/o swelling to her R side of face. Minimal swelling noted at this time.

## 2018-10-05 NOTE — ED Notes (Signed)
Pt verbalized understanding of discharge instructions. NAD at this time. 

## 2018-10-05 NOTE — ED Provider Notes (Signed)
[facility] Emergency Department Provider Note  ____________________________________________  Time seen: Approximately 7:19 PM  I have reviewed the triage vital signs and the nursing notes.   HISTORY  Chief Complaint Facial Swelling    HPI Nicole Gross is a 33 y.o. female presents to the emergency department with acute onset of right-sided facial pain and frontotemporal swelling.  Patient reports that she has exquisite tenderness when she touches her right temple.  She denies a history of headache disorders.  She denies visual changes.  No jaw pain or tongue pain.  Patient denies body aches or myalgias.  She reports that she has never had similar symptoms in the past.  She also states that she has had some vertigo with standing.  She denies pharyngitis or ear pain.  No associated rhinorrhea, congestion or nonproductive cough.  She has no pain over the sinuses.  No other alleviating measures have been attempted.    Past Medical History:  Diagnosis Date  . Asthma   . Ectopic pregnancy    x 2    Patient Active Problem List   Diagnosis Date Noted  . Overweight (BMI 25.0-29.9) 04/03/2018  . PID (acute pelvic inflammatory disease) 07/03/2016  . Female pelvic inflammatory disease 09/17/2015  . Leukocytosis 09/17/2015  . History of gastrointestinal bleeding 09/17/2015  . Anemia 09/17/2015  . Pyuria 09/17/2015  . Tobacco abuse 09/17/2015  . Epiploic appendagitis 09/15/2015  . Asthma 07/26/2004    Past Surgical History:  Procedure Laterality Date  . ECTOPIC PREGNANCY SURGERY     h/o 1 ectopic on each side    Prior to Admission medications   Medication Sig Start Date End Date Taking? Authorizing Provider  albuterol (PROVENTIL HFA;VENTOLIN HFA) 108 (90 Base) MCG/ACT inhaler Inhale 2 puffs into the lungs every 6 (six) hours as needed for wheezing or shortness of breath. 12/20/17   Darel Hong, MD  benzonatate (TESSALON PERLES) 100 MG capsule Take 1-2  tabs TID prn cough 07/15/18   Menshew, Dannielle Karvonen, PA-C  clindamycin (CLEOCIN) 300 MG capsule Take 1 capsule (300 mg total) by mouth 3 (three) times daily for 10 days. 10/05/18 10/15/18  Lannie Fields, PA-C  ketorolac (TORADOL) 10 MG tablet Take 1 tablet (10 mg total) by mouth every 6 (six) hours as needed for up to 5 days. 10/05/18 10/10/18  Lannie Fields, PA-C  Spacer/Aero Chamber Mouthpiece MISC 1 Units by Does not apply route every 4 (four) hours as needed (wheezing). 12/20/17   Darel Hong, MD    Allergies Amoxicillin; Ciprofloxacin; Penicillins; and Codeine  Family History  Problem Relation Age of Onset  . Hypertension Mother   . Diabetes Mother   . Asthma Mother   . Hypertension Father   . Diabetes Father   . Gout Father   . Asthma Sister   . Hypertension Maternal Grandmother   . Gout Paternal Grandmother   . Alzheimer's disease Paternal Grandmother   . Heart failure Paternal Grandmother     Social History Social History   Tobacco Use  . Smoking status: Current Every Day Smoker    Packs/day: 0.50    Years: 10.00    Pack years: 5.00    Types: Cigarettes  . Smokeless tobacco: Never Used  Substance Use Topics  . Alcohol use: Yes    Comment: occ  . Drug use: Yes    Types: Marijuana     Review of Systems  Constitutional: No fever/chills Eyes: No visual changes. No discharge ENT: No upper  respiratory complaints. Cardiovascular: no chest pain. Respiratory: no cough. No SOB. Gastrointestinal: No abdominal pain.  No nausea, no vomiting.  No diarrhea.  No constipation. Genitourinary: Negative for dysuria. No hematuria Musculoskeletal: Negative for musculoskeletal pain. Skin: Negative for rash, abrasions, lacerations, ecchymosis. Neurological: Patient has headache, no focal weakness or numbness.   ____________________________________________   PHYSICAL EXAM:  VITAL SIGNS: ED Triage Vitals  Enc Vitals Group     BP 10/05/18 1854 128/81     Pulse Rate  10/05/18 1854 89     Resp 10/05/18 1854 16     Temp 10/05/18 1854 98.3 F (36.8 C)     Temp Source 10/05/18 1854 Oral     SpO2 10/05/18 1854 98 %     Weight 10/05/18 1849 142 lb (64.4 kg)     Height 10/05/18 1849 4\' 11"  (1.499 m)     Head Circumference --      Peak Flow --      Pain Score 10/05/18 1848 7     Pain Loc --      Pain Edu? --      Excl. in [place]? --      Constitutional: Alert and oriented. Well appearing and in no acute distress. Eyes: Conjunctivae are normal. PERRL. EOMI. Head: Atraumatic.  Patient reports exquisite tenderness to palpation over the right temple.  No palpable cord.  Temporal pulse was palpated.  Patient also reports that pain radiates into her scalp. ENT:      Ears: TMs are pearly.       Nose: No congestion/rhinnorhea.      Mouth/Throat: Mucous membranes are moist.  Neck: No stridor.  No cervical spine tenderness to palpation. Cardiovascular: Normal rate, regular rhythm. Normal S1 and S2.  Good peripheral circulation. Respiratory: Normal respiratory effort without tachypnea or retractions. Lungs CTAB. Good air entry to the bases with no decreased or absent breath sounds. Gastrointestinal: Bowel sounds 4 quadrants. Soft and nontender to palpation. No guarding or rigidity. No palpable masses. No distention. No CVA tenderness. Musculoskeletal: Full range of motion to all extremities. No gross deformities appreciated. Neurologic:  Normal speech and language. No gross focal neurologic deficits are appreciated.  Skin:  Skin is warm, dry and intact. No rash noted. Psychiatric: Mood and affect are normal. Speech and behavior are normal. Patient exhibits appropriate insight and judgement.   ____________________________________________   LABS (all labs ordered are listed, but only abnormal results are displayed)  Labs Reviewed  CBC WITH DIFFERENTIAL/PLATELET - Abnormal; Notable for the following components:      Result Value   WBC 13.6 (*)    Neutro Abs 8.2  (*)    Monocytes Absolute 1.2 (*)    Eosinophils Absolute 1.0 (*)    All other components within normal limits  COMPREHENSIVE METABOLIC PANEL - Abnormal; Notable for the following components:   Calcium 8.6 (*)    Total Bilirubin 1.5 (*)    All other components within normal limits  SEDIMENTATION RATE   ____________________________________________  EKG   ____________________________________________  RADIOLOGY I personally viewed and evaluated these images as part of my medical decision making, as well as reviewing the written report by the radiologist    Ct Head Wo Contrast  Result Date: 10/05/2018 CLINICAL DATA:  Soreness the right-sided head radiating into face. Swelling and right-sided face. Headache. EXAM: CT HEAD WITHOUT CONTRAST TECHNIQUE: Contiguous axial images were obtained from the base of the skull through the vertex without intravenous contrast. COMPARISON:  None. FINDINGS: Brain: No  subdural, epidural, or subarachnoid hemorrhage. The cerebellum, brainstem, and basal cisterns are normal. Calcifications are seen in the basal ganglia bilaterally. No acute cortical ischemia or infarct. No mass effect or midline shift. Vascular: No hyperdense vessel or unexpected calcification. Skull: Normal. Negative for fracture or focal lesion. Sinuses/Orbits: No acute finding. Other: None. IMPRESSION: No cause for the patient's symptoms identified. No acute intracranial abnormalities. Electronically Signed   By: Dorise Bullion III M.D   On: 10/05/2018 19:53    ____________________________________________    PROCEDURES  Procedure(s) performed:    Procedures    Medications  ketorolac (TORADOL) 30 MG/ML injection 30 mg (has no administration in time range)  clindamycin (CLEOCIN) capsule 300 mg (has no administration in time range)     ____________________________________________   INITIAL IMPRESSION / ASSESSMENT AND PLAN / ED COURSE  Pertinent labs & imaging results that  were available during my care of the patient were reviewed by me and considered in my medical decision making (see chart for details).  Review of the [place] CSRS was performed in accordance of the [place] prior to dispensing any controlled drugs.      Assessment and plan:  Dental abscess Patient presented to the emergency department with new onset right sided temporal headache.  Patient had pain with palpation over the right temple and right lower jaw.  Differential diagnosis included dental abscess versus temporal arteritis.  Patient has new onset headache but no visual disturbances or polyarthralgias and does not meet typical age demographic for temporal arteritis.  Sed rate was within range in the emergency department and no acute abnormalities were identified on CT head.  Patient did have mild leukocytosis on CBC and patient did have pain to palpation along the right upper jaw, increasing suspicion for dental abscess.  Patient was given Toradol in the emergency department for pain and clindamycin.  She was discharged with clindamycin.  Strict return precautions were given to return to the emergency department for new or worsening symptoms.  All patient questions were answered.   ____________________________________________  FINAL CLINICAL IMPRESSION(S) / ED DIAGNOSES  Final diagnoses:  Dental abscess      NEW MEDICATIONS STARTED DURING THIS VISIT:  ED Discharge Orders         Ordered    clindamycin (CLEOCIN) 300 MG capsule  3 times daily     10/05/18 2042    ketorolac (TORADOL) 10 MG tablet  Every 6 hours PRN     10/05/18 2042              This chart was dictated using voice recognition software/Dragon. Despite best efforts to proofread, errors can occur which can change the meaning. Any change was purely unintentional.    Lannie Fields, PA-C 10/05/18 2049    Rudene Re, MD 10/05/18 2248

## 2018-10-05 NOTE — ED Notes (Signed)
Patient transported to CT 

## 2019-01-26 ENCOUNTER — Other Ambulatory Visit: Payer: Self-pay

## 2019-01-26 ENCOUNTER — Emergency Department
Admission: EM | Admit: 2019-01-26 | Discharge: 2019-01-26 | Disposition: A | Discharge status: Home / Self Care | Payer: Self-pay | Attending: Emergency Medicine | Admitting: Emergency Medicine

## 2019-01-26 ENCOUNTER — Encounter: Payer: Self-pay | Admitting: Emergency Medicine

## 2019-01-26 ENCOUNTER — Emergency Department: Payer: Self-pay

## 2019-01-26 DIAGNOSIS — R0789 Other chest pain: Secondary | ICD-10-CM | POA: Insufficient documentation

## 2019-01-26 DIAGNOSIS — F17210 Nicotine dependence, cigarettes, uncomplicated: Secondary | ICD-10-CM | POA: Insufficient documentation

## 2019-01-26 DIAGNOSIS — J45909 Unspecified asthma, uncomplicated: Secondary | ICD-10-CM | POA: Insufficient documentation

## 2019-01-26 DIAGNOSIS — R079 Chest pain, unspecified: Secondary | ICD-10-CM

## 2019-01-26 DIAGNOSIS — R0602 Shortness of breath: Secondary | ICD-10-CM | POA: Insufficient documentation

## 2019-01-26 LAB — CBC
HCT: 38.8 % (ref 36.0–46.0)
Hemoglobin: 12.8 g/dL (ref 12.0–15.0)
MCH: 29.8 pg (ref 26.0–34.0)
MCHC: 33 g/dL (ref 30.0–36.0)
MCV: 90.2 fL (ref 80.0–100.0)
Platelets: 341 10*3/uL (ref 150–400)
RBC: 4.3 MIL/uL (ref 3.87–5.11)
RDW: 14.4 % (ref 11.5–15.5)
WBC: 8.8 10*3/uL (ref 4.0–10.5)
nRBC: 0 % (ref 0.0–0.2)

## 2019-01-26 LAB — BASIC METABOLIC PANEL
Anion gap: 9 (ref 5–15)
BUN: 10 mg/dL (ref 6–20)
CO2: 25 mmol/L (ref 22–32)
Calcium: 8.6 mg/dL — ABNORMAL LOW (ref 8.9–10.3)
Chloride: 106 mmol/L (ref 98–111)
Creatinine, Ser: 0.85 mg/dL (ref 0.44–1.00)
GFR calc Af Amer: >60 mL/min (ref >60)
GFR calc non Af Amer: >60 mL/min (ref >60)
Glucose, Bld: 105 mg/dL — ABNORMAL HIGH (ref 70–99)
Potassium: 3.3 mmol/L — ABNORMAL LOW (ref 3.5–5.1)
Sodium: 140 mmol/L (ref 135–145)

## 2019-01-26 LAB — TROPONIN I (HIGH SENSITIVITY): Troponin I (High Sensitivity): <2 ng/L (ref <18)

## 2019-01-26 MED ORDER — SODIUM CHLORIDE 0.9% FLUSH: 3.0000 mL | Freq: Once | INTRAVENOUS | Status: DC

## 2019-01-26 NOTE — ED Triage Notes (Signed)
Pt presents to ED via POV with c/o L sided CP x 2 weeks, pt denies radiation at this time, pt also c/o SOB. Pt states pain is "kind of like a fluttering feeling". Pt is A&O x4, NAD noted at this time.

## 2019-01-26 NOTE — ED Provider Notes (Signed)
[facility] Emergency Department Provider Note   ____________________________________________   First MD Initiated Contact with Patient 01/26/19 2109     (approximate)  I have reviewed the triage vital signs and the nursing notes.   HISTORY  Chief Complaint Chest Pain    HPI Nicole Gross is a 33 y.o. female patient complains of some pain in the left chest for about 2 weeks.  The pain is easily reproduced by palpation over the costochondral junctions and the ribs in the left side of the upper chest.  Patient reports it comes and goes several times a day last a few minutes.  There is some pain with respirations and shortness of breath while she is having it but otherwise there is not.  She is not having any fever or cough.  Patient takes Naprosyn for the pain when it comes.  The Naprosyn seems to help.  As I understand the patient there is no association with exercise and this pain         Past Medical History:  Diagnosis Date  . Asthma   . Ectopic pregnancy    x 2    Patient Active Problem List   Diagnosis Date Noted  . Overweight (BMI 25.0-29.9) 04/03/2018  . PID (acute pelvic inflammatory disease) 07/03/2016  . Female pelvic inflammatory disease 09/17/2015  . Leukocytosis 09/17/2015  . History of gastrointestinal bleeding 09/17/2015  . Anemia 09/17/2015  . Pyuria 09/17/2015  . Tobacco abuse 09/17/2015  . Epiploic appendagitis 09/15/2015  . Asthma 07/26/2004    Past Surgical History:  Procedure Laterality Date  . ECTOPIC PREGNANCY SURGERY     h/o 1 ectopic on each side    Prior to Admission medications   Medication Sig Start Date End Date Taking? Authorizing Provider  albuterol (PROVENTIL HFA;VENTOLIN HFA) 108 (90 Base) MCG/ACT inhaler Inhale 2 puffs into the lungs every 6 (six) hours as needed for wheezing or shortness of breath. 12/20/17   Darel Hong, MD  benzonatate (TESSALON PERLES) 100 MG capsule Take 1-2 tabs TID prn cough  07/15/18   Menshew, Dannielle Karvonen, PA-C  Spacer/Aero Chamber Mouthpiece MISC 1 Units by Does not apply route every 4 (four) hours as needed (wheezing). 12/20/17   Darel Hong, MD    Allergies Amoxicillin, Ciprofloxacin, Penicillins, and Codeine  Family History  Problem Relation Age of Onset  . Hypertension Mother   . Diabetes Mother   . Asthma Mother   . Hypertension Father   . Diabetes Father   . Gout Father   . Asthma Sister   . Hypertension Maternal Grandmother   . Gout Paternal Grandmother   . Alzheimer's disease Paternal Grandmother   . Heart failure Paternal Grandmother     Social History Social History   Tobacco Use  . Smoking status: Current Every Day Smoker    Packs/day: 0.50    Years: 10.00    Pack years: 5.00    Types: Cigarettes  . Smokeless tobacco: Never Used  Substance Use Topics  . Alcohol use: Yes    Comment: occ  . Drug use: Yes    Types: Marijuana    Review of Systems  Constitutional: No fever/chills Eyes: No visual changes. ENT: No sore throat. Cardiovascular: See HPI Respiratory: See HPI. Gastrointestinal: No abdominal pain.  No nausea, no vomiting.  No diarrhea.  No constipation. Genitourinary: Negative for dysuria. Musculoskeletal: Negative for back pain. Skin: Negative for rash. Neurological: Negative for headaches, focal weakness   ____________________________________________   PHYSICAL  EXAM:  VITAL SIGNS: ED Triage Vitals  Enc Vitals Group     BP 01/26/19 1725 113/73     Pulse Rate 01/26/19 1725 75     Resp 01/26/19 1725 18     Temp 01/26/19 1725 99.7 F (37.6 C)     Temp Source 01/26/19 1725 Oral     SpO2 01/26/19 1725 98 %     Weight 01/26/19 1723 145 lb (65.8 kg)     Height 01/26/19 1723 4\' 11"  (1.499 m)     Head Circumference --      Peak Flow --      Pain Score 01/26/19 1723 5     Pain Loc --      Pain Edu? --      Excl. in [place]? --     Constitutional: Alert and oriented. Well appearing and in no acute  distress. Eyes: Conjunctivae are normal.  Head: Atraumatic. Nose: No congestion/rhinnorhea. Mouth/Throat: Mucous membranes are moist.  Oropharynx non-erythematous. Neck: No stridor.  Cardiovascular: Normal rate, regular rhythm. Grossly normal heart sounds.  Good peripheral circulation. Respiratory: Normal respiratory effort.  No retractions. Lungs CTAB! Gastrointestinal: Soft and nontender. No distention. No abdominal bruits. No CVA tenderness. Musculoskeletal: No lower extremity tenderness nor edema.  Neurologic:  Normal speech and language. No gross focal neurologic deficits are appreciated. Skin:  Skin is warm, dry and intact. No rash noted.   ____________________________________________   LABS (all labs ordered are listed, but only abnormal results are displayed)  Labs Reviewed  BASIC METABOLIC PANEL - Abnormal; Notable for the following components:      Result Value   Potassium 3.3 (*)    Glucose, Bld 105 (*)    Calcium 8.6 (*)    All other components within normal limits  CBC  POC URINE PREG, ED  TROPONIN I (HIGH SENSITIVITY)  TROPONIN I (HIGH SENSITIVITY)   ____________________________________________  EKG  EKG read and interpreted by me shows normal sinus rhythm at 74 normal axis there are flipped T's in 3 and T's look biphasic in V3 and 4.  These changes were present in an EKG done here back in 2017.  There are other similar EKGs since that time. ____________________________________________  RADIOLOGY  ED MD interpretation: Rest x-ray read by radiology reviewed by me does not show any acute pathology  Official radiology report(s): Dg Chest 2 View  Result Date: 01/26/2019 CLINICAL DATA:  Two week history of LEFT-sided chest pain and shortness of breath. EXAM: CHEST - 2 VIEW COMPARISON:  12/20/2017 and earlier. FINDINGS: Cardiomediastinal silhouette unremarkable and unchanged. Lungs clear. Bronchovascular markings normal. Pulmonary vascularity normal. No visible  pleural effusions. No pneumothorax. Visualized bony thorax intact. No interval change. IMPRESSION: Normal and stable examination. Electronically Signed   By: Evangeline Dakin M.D.   On: 01/26/2019 19:17    ____________________________________________   PROCEDURES  Procedure(s) performed (including Critical Care):  Procedures   ____________________________________________   INITIAL IMPRESSION / ASSESSMENT AND PLAN / ED COURSE    Patient has a negative troponin and chest x-ray pain is reproduced by palpation most likely costochondritis however her EKG is abnormal.  This has been present for some time and is not overly worrisome but I will send her to cardiology so she can have the EKG evaluated.  She does not have a primary care physician to follow-up with her this point.          ____________________________________________   FINAL CLINICAL IMPRESSION(S) / ED DIAGNOSES  Final diagnoses:  Chest  pain, unspecified type     ED Discharge Orders    None       Note:  This document was prepared using Dragon voice recognition software and may include unintentional dictation errors.    Nena Polio, MD 01/26/19 2134

## 2019-01-26 NOTE — Discharge Instructions (Addendum)
I think your pain is related to some inflammation between the ribs and breastbone.  Naprosyn is a good treatment for this.  If you take 2 of the over-the-counter Naprosyn twice a day with food and do that for about 4 days that should help a good bit.  Because the EKG is not exactly normal even though these changes have been present for the last 3 years I will ask you to follow-up with cardiology.  Dr. Caryl Comes is on call.  Please give his office a buzz in the morning.  Let them know that you were seen in the emergency room for chest pain.  They should be out of work you in fairly quickly.  Please return here for worse pain fever vomiting or feeling sicker.

## 2019-02-27 ENCOUNTER — Emergency Department
Admission: EM | Admit: 2019-02-27 | Discharge: 2019-02-28 | Disposition: A | Discharge status: Home / Self Care | Payer: Self-pay | Attending: Emergency Medicine | Admitting: Emergency Medicine

## 2019-02-27 ENCOUNTER — Other Ambulatory Visit: Payer: Self-pay

## 2019-02-27 ENCOUNTER — Encounter: Payer: Self-pay | Admitting: Intensive Care

## 2019-02-27 DIAGNOSIS — J45909 Unspecified asthma, uncomplicated: Secondary | ICD-10-CM | POA: Insufficient documentation

## 2019-02-27 DIAGNOSIS — N730 Acute parametritis and pelvic cellulitis: Secondary | ICD-10-CM

## 2019-02-27 DIAGNOSIS — N739 Female pelvic inflammatory disease, unspecified: Secondary | ICD-10-CM | POA: Insufficient documentation

## 2019-02-27 DIAGNOSIS — F17210 Nicotine dependence, cigarettes, uncomplicated: Secondary | ICD-10-CM | POA: Insufficient documentation

## 2019-02-27 HISTORY — DX: Female pelvic inflammatory disease, unspecified: N73.9

## 2019-02-27 LAB — COMPREHENSIVE METABOLIC PANEL
ALT: 17 U/L (ref 0–44)
AST: 24 U/L (ref 15–41)
Albumin: 3.1 g/dL — ABNORMAL LOW (ref 3.5–5.0)
Alkaline Phosphatase: 47 U/L (ref 38–126)
Anion gap: 4 — ABNORMAL LOW (ref 5–15)
BUN: 15 mg/dL (ref 6–20)
CO2: 22 mmol/L (ref 22–32)
Calcium: 7.9 mg/dL — ABNORMAL LOW (ref 8.9–10.3)
Chloride: 111 mmol/L (ref 98–111)
Creatinine, Ser: 0.79 mg/dL (ref 0.44–1.00)
GFR calc Af Amer: >60 mL/min (ref >60)
GFR calc non Af Amer: >60 mL/min (ref >60)
Glucose, Bld: 104 mg/dL — ABNORMAL HIGH (ref 70–99)
Potassium: 3.3 mmol/L — ABNORMAL LOW (ref 3.5–5.1)
Sodium: 137 mmol/L (ref 135–145)
Total Bilirubin: 0.3 mg/dL (ref 0.3–1.2)
Total Protein: 5.4 g/dL — ABNORMAL LOW (ref 6.5–8.1)

## 2019-02-27 LAB — CBC
HCT: 43.2 % (ref 36.0–46.0)
Hemoglobin: 14 g/dL (ref 12.0–15.0)
MCH: 30.2 pg (ref 26.0–34.0)
MCHC: 32.4 g/dL (ref 30.0–36.0)
MCV: 93.1 fL (ref 80.0–100.0)
Platelets: 300 10*3/uL (ref 150–400)
RBC: 4.64 MIL/uL (ref 3.87–5.11)
RDW: 13.8 % (ref 11.5–15.5)
WBC: 8.3 10*3/uL (ref 4.0–10.5)
nRBC: 0 % (ref 0.0–0.2)

## 2019-02-27 LAB — URINALYSIS, COMPLETE (UACMP) WITH MICROSCOPIC
Bacteria, UA: NONE SEEN
Bilirubin Urine: NEGATIVE
Glucose, UA: NEGATIVE mg/dL
Hgb urine dipstick: NEGATIVE
Ketones, ur: NEGATIVE mg/dL
Leukocytes,Ua: NEGATIVE
Nitrite: NEGATIVE
Protein, ur: NEGATIVE mg/dL
Specific Gravity, Urine: 1.028 (ref 1.005–1.030)
pH: 5 (ref 5.0–8.0)

## 2019-02-27 LAB — WET PREP, GENITAL
Sperm: NONE SEEN
Trich, Wet Prep: NONE SEEN
Yeast Wet Prep HPF POC: NONE SEEN

## 2019-02-27 LAB — LIPASE, BLOOD: Lipase: 29 U/L (ref 11–51)

## 2019-02-27 MED ORDER — OXYCODONE-ACETAMINOPHEN 5-325 MG PO TABS
1.0000 | ORAL_TABLET | ORAL | Status: DC | PRN
  Administered 2019-02-27: 1 via ORAL
  Filled 2019-02-27: qty 1

## 2019-02-27 MED ORDER — DOXYCYCLINE MONOHYDRATE 100 MG PO TABS: 100.0000 mg | ORAL_TABLET | Freq: Two times a day (BID) | ORAL | 0 refills | Status: AC

## 2019-02-27 MED ORDER — ONDANSETRON HCL 4 MG PO TABS: 4.0000 mg | ORAL_TABLET | Freq: Every day | ORAL | 0 refills | Status: DC | PRN

## 2019-02-27 MED ORDER — ONDANSETRON 4 MG PO TBDP
4.0000 mg | ORAL_TABLET | Freq: Once | ORAL | Status: AC | PRN
  Administered 2019-02-27: 19:00:00 4 mg via ORAL
  Filled 2019-02-27: qty 1

## 2019-02-27 MED ORDER — NAPROXEN 500 MG PO TABS: 500.0000 mg | ORAL_TABLET | Freq: Two times a day (BID) | ORAL | 0 refills | Status: AC

## 2019-02-27 MED ORDER — OXYCODONE-ACETAMINOPHEN 5-325 MG PO TABS
1.0000 | ORAL_TABLET | Freq: Once | ORAL | Status: AC
  Administered 2019-02-27: 1 via ORAL
  Filled 2019-02-27: qty 1

## 2019-02-27 MED ORDER — CLINDAMYCIN PHOSPHATE 900 MG/50ML IV SOLN
900.0000 mg | Freq: Once | INTRAVENOUS | Status: AC
  Administered 2019-02-27: 900 mg via INTRAVENOUS
  Filled 2019-02-27: qty 50

## 2019-02-27 MED ORDER — GENTAMICIN SULFATE 40 MG/ML IJ SOLN
2.0000 mg/kg | Freq: Once | INTRAVENOUS | Status: AC
  Administered 2019-02-27: 130 mg via INTRAVENOUS
  Filled 2019-02-27: qty 3.25

## 2019-02-27 MED ORDER — METRONIDAZOLE 250 MG PO TABS: 500.0000 mg | ORAL_TABLET | Freq: Two times a day (BID) | ORAL | 0 refills | Status: AC

## 2019-02-27 NOTE — ED Notes (Signed)
POC PREG NEGATIVE  

## 2019-02-27 NOTE — Discharge Instructions (Addendum)
We are treating you for pelvic inflammatory disease.  Your gonorrhea and Chlamydia test are still pending.  They will not result till tomorrow.   Your wet prep was concerning for bacterial vaginosis.  We prescribed  Flagyl.  Do not drink alcohol with this.  Also start you on doxycycline for the PID.  You should take these medications.  If you develop worsening symptoms like I talked about that suggest appendicitis or ovarian torsion you should return to the ER such as recurrent vomiting, fevers, worsening pain.  Do not have sex until partner is tested as well if you are positive.

## 2019-02-27 NOTE — ED Provider Notes (Signed)
[facility] Emergency Department Provider Note  ____________________________________________   First MD Initiated Contact with Patient 02/27/19 2143     (approximate)  I have reviewed the triage vital signs and the nursing notes.   HISTORY  Chief Complaint Abdominal Pain    HPI Nicole Gross is a 33 y.o. female with prior ectopic pregnancy and PID who presents with abd pain.  Patient endorses having pain more so suprapubically.  The pain started around 5:30 PM.  It radiated up into her abdomen a little bit onto the right side.  Pain was moderate, intermittent.  It was associate with one episode of vomiting.  She denies any dysuria, vaginal symptoms.  She has had PID in the past and says that this feels similar.  The pain has been constant in nature.  No fevers.            Past Medical History:  Diagnosis Date  . Asthma   . Ectopic pregnancy    x 2  . Pelvic inflammatory disease (PID)     Patient Active Problem List   Diagnosis Date Noted  . Overweight (BMI 25.0-29.9) 04/03/2018  . PID (acute pelvic inflammatory disease) 07/03/2016  . Female pelvic inflammatory disease 09/17/2015  . Leukocytosis 09/17/2015  . History of gastrointestinal bleeding 09/17/2015  . Anemia 09/17/2015  . Pyuria 09/17/2015  . Tobacco abuse 09/17/2015  . Epiploic appendagitis 09/15/2015  . Asthma 07/26/2004    Past Surgical History:  Procedure Laterality Date  . ECTOPIC PREGNANCY SURGERY     h/o 1 ectopic on each side    Prior to Admission medications   Medication Sig Start Date End Date Taking? Authorizing Provider  albuterol (PROVENTIL HFA;VENTOLIN HFA) 108 (90 Base) MCG/ACT inhaler Inhale 2 puffs into the lungs every 6 (six) hours as needed for wheezing or shortness of breath. 12/20/17   Darel Hong, MD  benzonatate (TESSALON PERLES) 100 MG capsule Take 1-2 tabs TID prn cough 07/15/18   Menshew, Dannielle Karvonen, PA-C  Spacer/Aero Chamber Mouthpiece MISC 1  Units by Does not apply route every 4 (four) hours as needed (wheezing). 12/20/17   Darel Hong, MD    Allergies Amoxicillin, Ciprofloxacin, Penicillins, and Codeine  Family History  Problem Relation Age of Onset  . Hypertension Mother   . Diabetes Mother   . Asthma Mother   . Hypertension Father   . Diabetes Father   . Gout Father   . Asthma Sister   . Hypertension Maternal Grandmother   . Gout Paternal Grandmother   . Alzheimer's disease Paternal Grandmother   . Heart failure Paternal Grandmother     Social History Social History   Tobacco Use  . Smoking status: Current Every Day Smoker    Packs/day: 0.50    Years: 10.00    Pack years: 5.00    Types: Cigarettes  . Smokeless tobacco: Never Used  Substance Use Topics  . Alcohol use: Yes    Comment: occ  . Drug use: Yes    Types: Marijuana      Review of Systems Constitutional: No fever/chills Eyes: No visual changes. ENT: No sore throat. Cardiovascular: Denies chest pain. Respiratory: Denies shortness of breath. Gastrointestinal: Positive abdominal pain.  Positive vomitingNo diarrhea.  No constipation. Genitourinary: Negative for dysuria. Musculoskeletal: Negative for back pain. Skin: Negative for rash. Neurological: Negative for headaches, focal weakness or numbness. All other ROS negative ____________________________________________   PHYSICAL EXAM:  VITAL SIGNS: ED Triage Vitals  Enc Vitals Group  BP 02/27/19 1818 117/64     Pulse Rate 02/27/19 1818 80     Resp 02/27/19 1818 16     Temp 02/27/19 1818 98.2 F (36.8 C)     Temp Source 02/27/19 1818 Oral     SpO2 02/27/19 1818 98 %     Weight 02/27/19 1818 144 lb (65.3 kg)     Height 02/27/19 1818 4\' 11"  (1.499 m)     Head Circumference --      Peak Flow --      Pain Score 02/27/19 1828 10     Pain Loc --      Pain Edu? --      Excl. in [place]? --     Constitutional: Alert and oriented. Well appearing and in no acute distress. Eyes:  Conjunctivae are normal. EOMI. Head: Atraumatic. Nose: No congestion/rhinnorhea. Mouth/Throat: Mucous membranes are moist.   Neck: No stridor. Trachea Midline. FROM Cardiovascular: Normal rate, regular rhythm. Grossly normal heart sounds.  Good peripheral circulation. Respiratory: Normal respiratory effort.  No retractions. Lungs CTAB. Gastrointestinal: Soft and slight tenderness suprapubically.  No rebound or guarding. No distention. No abdominal bruits.  Musculoskeletal: No lower extremity tenderness nor edema.  No joint effusions. Neurologic:  Normal speech and language. No gross focal neurologic deficits are appreciated.  Skin:  Skin is warm, dry and intact. No rash noted. Psychiatric: Mood and affect are normal. Speech and behavior are normal. GU: Deferred   ____________________________________________   LABS (all labs ordered are listed, but only abnormal results are displayed)  Labs Reviewed  COMPREHENSIVE METABOLIC PANEL - Abnormal; Notable for the following components:      Result Value   Potassium 3.3 (*)    Glucose, Bld 104 (*)    Calcium 7.9 (*)    Total Protein 5.4 (*)    Albumin 3.1 (*)    Anion gap 4 (*)    All other components within normal limits  URINALYSIS, COMPLETE (UACMP) WITH MICROSCOPIC - Abnormal; Notable for the following components:   Color, Urine YELLOW (*)    APPearance HAZY (*)    All other components within normal limits  WET PREP, GENITAL  GC/CHLAMYDIA PROBE AMP  LIPASE, BLOOD  CBC  POC URINE PREG, ED    PROCEDURES  Procedure(s) performed (including Critical Care):  Procedures   ____________________________________________   INITIAL IMPRESSION / ASSESSMENT AND PLAN / ED COURSE  Nicole Gross was evaluated in Emergency Department on 02/27/2019 for the symptoms described in the history of present illness. She was evaluated in the context of the global COVID-19 pandemic, which necessitated consideration that the patient might be at risk  for infection with the SARS-CoV-2 virus that causes COVID-19. Institutional protocols and algorithms that pertain to the evaluation of patients at risk for COVID-19 are in a state of rapid change based on information released by regulatory bodies including the CDC and federal and state organizations. These policies and algorithms were followed during the patient's care in the ED.    Patient is a well-appearing 33 year old who presents with abdominal pain that started today.  Although the report from triage was right lower quadrant pains patient states pain seems to be more suprapubic.  Will get labs to evaluate for UTI, ectopic and do pelvic exam to evaluate for PID.  Also get basic labs to evaluate for infection, pancreatitis, cholecystitis although lower suspicion given the location of the pain.   Lipase is normal therefore no evidence of pancreatitis.  Creatinine is at baseline.  LFTs are normal.  White count is normal.  Patient's pelvic exam had some discharge coming out of the cervix without any cervical erythema.  No cervical motion tenderness but does have right adnexal tenderness.   On review of records patient presented with right lower quadrant pain on 10/2017.  During this time patient had ultrasounds that were negative except for possibility of a ruptured ovarian cyst.  Patient was discharged with medications for PID.  She said her symptoms resolved.Pt was G/C positive.  Discussed with patient good chance of this being an ovarian cyst rupture versus PID.  Pt says this feels exactly like her prior PID will cover her for PID.  Also discussed the chance of this being ovarian torsion.  My suspicion is low given her pain is not intermittent nature and has been constant and feels very similar to prior PID.  I have low suspicion for tubo-ovarian abscess given no white count and no fevers.  I also discussed with her the possibility this being appendicitis but again seems less likely given no fevers and  no white count.  I offered CT imaging or pelvic ultrasound and patient declined.  She says this feels just like her prior PID and is okay with getting treatment to be discharged.  She understands that if her symptoms are worsening or changing anyway she should return to the ER for consideration of further workup.  Pt has severe reaction to penicillin.  Never has gotten ceftriaxone.  Per last visit pt was given gent/clinda and did well.  Will give these and send on flagyl/doxy.   POC urine HCG although not coming up in labs, was done and nurse reported that it was negative in the lab section.   I discussed the provisional nature of ED diagnosis, the treatment so far, the ongoing plan of care, follow up appointments and return precautions with the patient and any family or support people present. They expressed understanding and agreed with the plan, discharged home.      ____________________________________________   FINAL CLINICAL IMPRESSION(S) / ED DIAGNOSES   Final diagnoses:  PID (acute pelvic inflammatory disease)      MEDICATIONS GIVEN DURING THIS VISIT:  Medications  oxyCODONE-acetaminophen (PERCOCET/ROXICET) 5-325 MG per tablet 1 tablet (1 tablet Oral Given 02/27/19 1836)  ondansetron (ZOFRAN-ODT) disintegrating tablet 4 mg (4 mg Oral Given 02/27/19 1836)  oxyCODONE-acetaminophen (PERCOCET/ROXICET) 5-325 MG per tablet 1 tablet (1 tablet Oral Given 02/27/19 2258)  clindamycin (CLEOCIN) IVPB 900 mg (0 mg Intravenous Stopped 02/27/19 2329)  gentamicin (GARAMYCIN) 130 mg in dextrose 5 % 50 mL IVPB (0 mg Intravenous Stopped 02/28/19 0007)     ED Discharge Orders         Ordered    doxycycline (ADOXA) 100 MG tablet  2 times daily     02/27/19 2231    ondansetron (ZOFRAN) 4 MG tablet  Daily PRN     02/27/19 2231    metroNIDAZOLE (FLAGYL) 250 MG tablet  2 times daily     02/27/19 2244    naproxen (NAPROSYN) 500 MG tablet  2 times daily with meals     02/27/19 2248            Note:  This document was prepared using Dragon voice recognition software and may include unintentional dictation errors.   Vanessa [place], MD 02/28/19 0100

## 2019-02-27 NOTE — ED Triage Notes (Signed)
PAtient c/o right sided flank pain that radiates down leg with emesis. Denies any bleeding or discharge. Denies trouble urinating.

## 2019-03-05 LAB — GC/CHLAMYDIA PROBE AMP
Chlamydia trachomatis, NAA: NEGATIVE
Neisseria Gonorrhoeae by PCR: NEGATIVE

## 2019-03-14 ENCOUNTER — Ambulatory Visit: Payer: Self-pay

## 2019-03-14 ENCOUNTER — Other Ambulatory Visit: Payer: Self-pay

## 2019-04-17 ENCOUNTER — Emergency Department: Payer: Self-pay

## 2019-04-17 ENCOUNTER — Encounter: Payer: Self-pay | Admitting: Emergency Medicine

## 2019-04-17 ENCOUNTER — Emergency Department
Admission: EM | Admit: 2019-04-17 | Discharge: 2019-04-17 | Disposition: A | Discharge status: Home / Self Care | Payer: Self-pay | Attending: Emergency Medicine | Admitting: Emergency Medicine

## 2019-04-17 DIAGNOSIS — M25532 Pain in left wrist: Secondary | ICD-10-CM | POA: Insufficient documentation

## 2019-04-17 DIAGNOSIS — F17210 Nicotine dependence, cigarettes, uncomplicated: Secondary | ICD-10-CM | POA: Insufficient documentation

## 2019-04-17 DIAGNOSIS — J45909 Unspecified asthma, uncomplicated: Secondary | ICD-10-CM | POA: Insufficient documentation

## 2019-04-17 MED ORDER — MELOXICAM 15 MG PO TABS: 15.0000 mg | ORAL_TABLET | Freq: Every day | ORAL | 1 refills | Status: AC

## 2019-04-17 NOTE — ED Notes (Signed)
Pt drove self to ED.

## 2019-04-17 NOTE — ED Provider Notes (Signed)
[facility] Emergency Department Provider Note  ____________________________________________  Time seen: Approximately 11:21 PM  I have reviewed the triage vital signs and the nursing notes.   HISTORY  Chief Complaint Hand Injury    HPI Nicole Gross is a 33 y.o. female presents to the emergency department with acute left wrist pain that occurred after patient hit her forearm against a piece of machinery at work on 04/16/19.  She primarily has pain to palpation over the anatomical snuffbox.  No numbness or tingling in the left hand.  No abrasions or lacerations.  No similar injuries in the past.  No other alleviating measures have been attempted.        Past Medical History:  Diagnosis Date  . Asthma   . Ectopic pregnancy    x 2  . Pelvic inflammatory disease (PID)     Patient Active Problem List   Diagnosis Date Noted  . Overweight (BMI 25.0-29.9) 04/03/2018  . PID (acute pelvic inflammatory disease) 07/03/2016  . Female pelvic inflammatory disease 09/17/2015  . Leukocytosis 09/17/2015  . History of gastrointestinal bleeding 09/17/2015  . Anemia 09/17/2015  . Pyuria 09/17/2015  . Tobacco abuse 09/17/2015  . Epiploic appendagitis 09/15/2015  . Asthma 07/26/2004    Past Surgical History:  Procedure Laterality Date  . ECTOPIC PREGNANCY SURGERY     h/o 1 ectopic on each side    Prior to Admission medications   Medication Sig Start Date End Date Taking? Authorizing Provider  albuterol (PROVENTIL HFA;VENTOLIN HFA) 108 (90 Base) MCG/ACT inhaler Inhale 2 puffs into the lungs every 6 (six) hours as needed for wheezing or shortness of breath. 12/20/17   Darel Hong, MD  benzonatate (TESSALON PERLES) 100 MG capsule Take 1-2 tabs TID prn cough 07/15/18   Menshew, Dannielle Karvonen, PA-C  meloxicam (MOBIC) 15 MG tablet Take 1 tablet (15 mg total) by mouth daily for 7 days. 04/17/19 04/24/19  Lannie Fields, PA-C  ondansetron (ZOFRAN) 4 MG tablet Take 1  tablet (4 mg total) by mouth daily as needed for nausea or vomiting. 02/27/19 02/27/20  Vanessa [place], MD  Spacer/Aero Chamber Mouthpiece MISC 1 Units by Does not apply route every 4 (four) hours as needed (wheezing). 12/20/17   Darel Hong, MD    Allergies Amoxicillin, Ciprofloxacin, Penicillins, and Codeine  Family History  Problem Relation Age of Onset  . Hypertension Mother   . Diabetes Mother   . Asthma Mother   . Hypertension Father   . Diabetes Father   . Gout Father   . Asthma Sister   . Hypertension Maternal Grandmother   . Gout Paternal Grandmother   . Alzheimer's disease Paternal Grandmother   . Heart failure Paternal Grandmother     Social History Social History   Tobacco Use  . Smoking status: Current Every Day Smoker    Packs/day: 0.50    Years: 10.00    Pack years: 5.00    Types: Cigarettes  . Smokeless tobacco: Never Used  Substance Use Topics  . Alcohol use: Yes    Comment: occ  . Drug use: Yes    Types: Marijuana     Review of Systems  Constitutional: No fever/chills Eyes: No visual changes. No discharge ENT: No upper respiratory complaints. Cardiovascular: no chest pain. Respiratory: no cough. No SOB. Gastrointestinal: No abdominal pain.  No nausea, no vomiting.  No diarrhea.  No constipation. Musculoskeletal: Patient has left wrist pain.  Skin: Negative for rash, abrasions, lacerations, ecchymosis. Neurological: Negative  for headaches, focal weakness or numbness.  ____________________________________________   PHYSICAL EXAM:  VITAL SIGNS: ED Triage Vitals [04/17/19 2221]  Enc Vitals Group     BP 123/76     Pulse Rate 74     Resp 18     Temp 99.1 F (37.3 C)     Temp Source Oral     SpO2 100 %     Weight      Height      Head Circumference      Peak Flow      Pain Score      Pain Loc      Pain Edu?      Excl. in [place]?      Constitutional: Alert and oriented. Well appearing and in no acute distress. Eyes: Conjunctivae are  normal. PERRL. EOMI. Head: Atraumatic. Cardiovascular: Normal rate, regular rhythm. Normal S1 and S2.  Good peripheral circulation. Respiratory: Normal respiratory effort without tachypnea or retractions. Lungs CTAB. Good air entry to the bases with no decreased or absent breath sounds. Gastrointestinal: Bowel sounds 4 quadrants. Soft and nontender to palpation. No guarding or rigidity. No palpable masses. No distention. No CVA tenderness. Musculoskeletal: Patient demonstrates full range of motion at the left wrist.  She has tenderness to palpation over the anatomical snuffbox.  Patient does have some mild tenderness to palpation of the base of the left fifth metacarpal.  Palpable radial pulse, left.  Capillary refill less than 2 seconds for all 5 left fingers. Neurologic:  Normal speech and language. No gross focal neurologic deficits are appreciated.  Skin:  Skin is warm, dry and intact. No rash noted. Psychiatric: Mood and affect are normal. Speech and behavior are normal. Patient exhibits appropriate insight and judgement.   ____________________________________________   LABS (all labs ordered are listed, but only abnormal results are displayed)  Labs Reviewed - No data to display ____________________________________________  EKG   ____________________________________________  RADIOLOGY I personally viewed and evaluated these images as part of my medical decision making, as well as reviewing the written report by the radiologist.  Dg Forearm Left  Result Date: 04/17/2019 CLINICAL DATA:  Injury EXAM: LEFT FOREARM - 2 VIEW COMPARISON:  None. FINDINGS: There is no evidence of fracture or other focal bone lesions. Soft tissues are unremarkable. IMPRESSION: Negative. Electronically Signed   By: Prudencio Pair M.D.   On: 04/17/2019 23:05   Dg Hand Complete Left  Result Date: 04/17/2019 CLINICAL DATA:  Injury EXAM: LEFT HAND - COMPLETE 3+ VIEW COMPARISON:  None. FINDINGS: There is a tiny  ossific density seen along the base of the fifth metacarpal which could represent a tiny chip fracture. There is no evidence of arthropathy or other focal bone abnormality. Soft tissues are unremarkable. IMPRESSION: Tiny ossific density seen along the base of the fifth metacarpal which could represent a tiny chip fracture. Electronically Signed   By: Prudencio Pair M.D.   On: 04/17/2019 23:04    ____________________________________________    PROCEDURES  Procedure(s) performed:    Procedures    Medications - No data to display   ____________________________________________   INITIAL IMPRESSION / ASSESSMENT AND PLAN / ED COURSE  Pertinent labs & imaging results that were available during my care of the patient were reviewed by me and considered in my medical decision making (see chart for details).  Review of the [place] CSRS was performed in accordance of the [place] prior to dispensing any controlled drugs.  Assessment and plan Wrist pain 33 year old female presents to the emergency department with acute left wrist pain that occurred after patient hit her forearm against a piece of heavy machinery while at work.  Vital signs were reassuring at triage.  On physical exam, patient had tenderness to palpation of the left anatomical snuffbox and along the base of the left fifth metacarpal.  A small osseous bone fragment was visualized along the base of the fifth metacarpal concerning for avulsion fracture.  I was also concerned by patient's tenderness over the anatomical snuffbox.  Patient was placed in a Velcro wrist splint and advised to follow-up with orthopedics.  I recommended repeat x-rays in 1 week to better assess the scaphoid.  She was discharged with meloxicam.  All patient questions were answered.    ____________________________________________  FINAL CLINICAL IMPRESSION(S) / ED DIAGNOSES  Final diagnoses:  Left wrist pain      NEW MEDICATIONS STARTED DURING  THIS VISIT:  ED Discharge Orders         Ordered    meloxicam (MOBIC) 15 MG tablet  Daily     04/17/19 2318              This chart was dictated using voice recognition software/Dragon. Despite best efforts to proofread, errors can occur which can change the meaning. Any change was purely unintentional.    Lannie Fields, PA-C 04/17/19 2325    Nena Polio, MD 04/18/19 2136

## 2019-04-17 NOTE — ED Triage Notes (Signed)
Pt reports that she hit the metal part of a machine at her job on 10/7 and since has had left hand and forearm pain. Pt reports this is not a workers comp case. No swelling noted. No obvious deformity noted.

## 2019-07-02 ENCOUNTER — Other Ambulatory Visit: Payer: Self-pay

## 2019-07-02 ENCOUNTER — Encounter: Payer: Self-pay | Admitting: *Deleted

## 2019-07-02 ENCOUNTER — Emergency Department
Admission: EM | Admit: 2019-07-02 | Discharge: 2019-07-02 | Disposition: A | Discharge status: Home / Self Care | Payer: Self-pay | Attending: Emergency Medicine | Admitting: Emergency Medicine

## 2019-07-02 DIAGNOSIS — F17210 Nicotine dependence, cigarettes, uncomplicated: Secondary | ICD-10-CM | POA: Insufficient documentation

## 2019-07-02 DIAGNOSIS — J45909 Unspecified asthma, uncomplicated: Secondary | ICD-10-CM | POA: Insufficient documentation

## 2019-07-02 DIAGNOSIS — K047 Periapical abscess without sinus: Secondary | ICD-10-CM | POA: Insufficient documentation

## 2019-07-02 MED ORDER — DOXYCYCLINE HYCLATE 100 MG PO TABS: 100.0000 mg | ORAL_TABLET | Freq: Two times a day (BID) | ORAL | 0 refills | Status: DC

## 2019-07-02 MED ORDER — IBUPROFEN 600 MG PO TABS
600.0000 mg | ORAL_TABLET | Freq: Once | ORAL | Status: AC
  Administered 2019-07-02: 600 mg via ORAL
  Filled 2019-07-02: qty 1

## 2019-07-02 MED ORDER — ETODOLAC 500 MG PO TABS: 500.0000 mg | ORAL_TABLET | Freq: Two times a day (BID) | ORAL | 0 refills | Status: DC

## 2019-07-02 MED ORDER — DOXYCYCLINE HYCLATE 100 MG PO TABS
100.0000 mg | ORAL_TABLET | Freq: Once | ORAL | Status: AC
  Administered 2019-07-02: 100 mg via ORAL
  Filled 2019-07-02: qty 1

## 2019-07-02 NOTE — ED Provider Notes (Signed)
[facility] Emergency Department Provider Note  ____________________________________________  Time seen: Approximately 10:17 PM  I have reviewed the triage vital signs and the nursing notes.   HISTORY  Chief Complaint Dental Pain    HPI Nicole Gross is a 33 y.o. female who presents the emergency department complaining of right lower dental pain.  Patient states that she has had dental pain x2 days with some swelling to the right lower jaw.  No difficulty breathing or swallowing.  No fevers or chills.  Patient does not take any medication at home.  No other complaints at this time.         Past Medical History:  Diagnosis Date  . Asthma   . Ectopic pregnancy    x 2  . Pelvic inflammatory disease (PID)     Patient Active Problem List   Diagnosis Date Noted  . Overweight (BMI 25.0-29.9) 04/03/2018  . PID (acute pelvic inflammatory disease) 07/03/2016  . Female pelvic inflammatory disease 09/17/2015  . Leukocytosis 09/17/2015  . History of gastrointestinal bleeding 09/17/2015  . Anemia 09/17/2015  . Pyuria 09/17/2015  . Tobacco abuse 09/17/2015  . Epiploic appendagitis 09/15/2015  . Asthma 07/26/2004    Past Surgical History:  Procedure Laterality Date  . ECTOPIC PREGNANCY SURGERY     h/o 1 ectopic on each side    Prior to Admission medications   Medication Sig Start Date End Date Taking? Authorizing Provider  albuterol (PROVENTIL HFA;VENTOLIN HFA) 108 (90 Base) MCG/ACT inhaler Inhale 2 puffs into the lungs every 6 (six) hours as needed for wheezing or shortness of breath. 12/20/17   Darel Hong, MD  benzonatate (TESSALON PERLES) 100 MG capsule Take 1-2 tabs TID prn cough 07/15/18   Menshew, Dannielle Karvonen, PA-C  doxycycline (VIBRA-TABS) 100 MG tablet Take 1 tablet (100 mg total) by mouth 2 (two) times daily. 07/02/19   Rodricus Candelaria, Charline Bills, PA-C  etodolac (LODINE) 500 MG tablet Take 1 tablet (500 mg total) by mouth 2 (two) times daily.  07/02/19   Reni Hausner, Charline Bills, PA-C  ondansetron (ZOFRAN) 4 MG tablet Take 1 tablet (4 mg total) by mouth daily as needed for nausea or vomiting. 02/27/19 02/27/20  Vanessa [place], MD  Spacer/Aero Chamber Mouthpiece MISC 1 Units by Does not apply route every 4 (four) hours as needed (wheezing). 12/20/17   Darel Hong, MD    Allergies Amoxicillin, Ciprofloxacin, Penicillins, and Codeine  Family History  Problem Relation Age of Onset  . Hypertension Mother   . Diabetes Mother   . Asthma Mother   . Hypertension Father   . Diabetes Father   . Gout Father   . Asthma Sister   . Hypertension Maternal Grandmother   . Gout Paternal Grandmother   . Alzheimer's disease Paternal Grandmother   . Heart failure Paternal Grandmother     Social History Social History   Tobacco Use  . Smoking status: Current Every Day Smoker    Packs/day: 0.50    Years: 10.00    Pack years: 5.00    Types: Cigarettes  . Smokeless tobacco: Never Used  Substance Use Topics  . Alcohol use: Yes    Comment: occ  . Drug use: Yes    Types: Marijuana     Review of Systems  Constitutional: No fever/chills Eyes: No visual changes. No discharge ENT: Positive for right lower dental pain Cardiovascular: no chest pain. Respiratory: no cough. No SOB. Gastrointestinal: No abdominal pain.  No nausea, no vomiting. Musculoskeletal: Negative for  musculoskeletal pain. Skin: Negative for rash, abrasions, lacerations, ecchymosis. Neurological: Negative for headaches, focal weakness or numbness. 10-point ROS otherwise negative.  ____________________________________________   PHYSICAL EXAM:  VITAL SIGNS: ED Triage Vitals  Enc Vitals Group     BP 07/02/19 2034 118/78     Pulse Rate 07/02/19 2034 91     Resp 07/02/19 2034 18     Temp 07/02/19 2034 99.8 F (37.7 C)     Temp Source 07/02/19 2034 Oral     SpO2 07/02/19 2034 99 %     Weight 07/02/19 2032 145 lb (65.8 kg)     Height 07/02/19 2032 4\' 11"  (1.499 m)      Head Circumference --      Peak Flow --      Pain Score 07/02/19 2032 7     Pain Loc --      Pain Edu? --      Excl. in [place]? --      Constitutional: Alert and oriented. Well appearing and in no acute distress. Eyes: Conjunctivae are normal. PERRL. EOMI. Head: Atraumatic. ENT:      Ears:       Nose: No congestion/rhinnorhea.      Mouth/Throat: Mucous membranes are moist.  Overall good dentition.  Mild erythema surrounding tooth #31.  No appreciable fluctuance concerning for abscess.  Patient does have cracked dentition to this tooth.  No other gross oropharyngeal findings.  Uvula is midline.  Submandibular and submental space is soft with no appreciable abnormality.  No appreciable erythema or edema of the anterior neck. Neck: No stridor.   Hematological/Lymphatic/Immunilogical: No cervical lymphadenopathy.  Cardiovascular: Normal rate, regular rhythm. Normal S1 and S2.  Good peripheral circulation. Respiratory: Normal respiratory effort without tachypnea or retractions. Lungs CTAB. Good air entry to the bases with no decreased or absent breath sounds. Musculoskeletal: Full range of motion to all extremities. No gross deformities appreciated. Neurologic:  Normal speech and language. No gross focal neurologic deficits are appreciated.  Skin:  Skin is warm, dry and intact. No rash noted. Psychiatric: Mood and affect are normal. Speech and behavior are normal. Patient exhibits appropriate insight and judgement.   ____________________________________________   LABS (all labs ordered are listed, but only abnormal results are displayed)  Labs Reviewed - No data to display ____________________________________________  EKG   ____________________________________________  RADIOLOGY   No results found.  ____________________________________________    PROCEDURES  Procedure(s) performed:    Procedures    Medications  doxycycline (VIBRA-TABS) tablet 100 mg (has no  administration in time range)  ibuprofen (ADVIL) tablet 600 mg (has no administration in time range)     ____________________________________________   INITIAL IMPRESSION / ASSESSMENT AND PLAN / ED COURSE  Pertinent labs & imaging results that were available during my care of the patient were reviewed by me and considered in my medical decision making (see chart for details).  Review of the [place] CSRS was performed in accordance of the [place] prior to dispensing any controlled drugs.           Patient's diagnosis is consistent with dental infection.  Patient presented to emergency department with right lower dental pain x2 days.  No evidence of drainable abscess.  No indication for labs or imaging at this time.  Patient will be started on antibiotics here in the emergency department and discharged with ongoing prescription.  Follow-up with primary care dentist as needed.. . Patient is given ED precautions to return to the ED for any worsening  or new symptoms.     ____________________________________________  FINAL CLINICAL IMPRESSION(S) / ED DIAGNOSES  Final diagnoses:  Dental infection      NEW MEDICATIONS STARTED DURING THIS VISIT:  ED Discharge Orders         Ordered    doxycycline (VIBRA-TABS) 100 MG tablet  2 times daily     07/02/19 2228    etodolac (LODINE) 500 MG tablet  2 times daily     07/02/19 2228              This chart was dictated using voice recognition software/Dragon. Despite best efforts to proofread, errors can occur which can change the meaning. Any change was purely unintentional.    Darletta Moll, PA-C 07/02/19 2229    Carrie Mew, MD 07/02/19 [phone]

## 2019-07-02 NOTE — ED Notes (Signed)
Pt laying in bed on side. Fatigued. Has HA and R lower tooth pain. Does not see a dentist regularly. Denies dental caries. Denies fever and major sensitivity to hot/cold. States OTC meds at home have not dec pain at all.

## 2019-07-02 NOTE — ED Triage Notes (Signed)
Pt has right lower tooth ache for 3 days.  Taking naproxen without relief.  Pt alert

## 2019-07-14 ENCOUNTER — Emergency Department
Admission: EM | Admit: 2019-07-14 | Discharge: 2019-07-14 | Disposition: A | Discharge status: Home / Self Care | Payer: Self-pay | Attending: Emergency Medicine | Admitting: Emergency Medicine

## 2019-07-14 ENCOUNTER — Emergency Department: Payer: Self-pay

## 2019-07-14 ENCOUNTER — Other Ambulatory Visit: Payer: Self-pay

## 2019-07-14 ENCOUNTER — Encounter: Payer: Self-pay | Admitting: Medical Oncology

## 2019-07-14 DIAGNOSIS — M25551 Pain in right hip: Secondary | ICD-10-CM | POA: Insufficient documentation

## 2019-07-14 DIAGNOSIS — Z79899 Other long term (current) drug therapy: Secondary | ICD-10-CM | POA: Insufficient documentation

## 2019-07-14 DIAGNOSIS — F17210 Nicotine dependence, cigarettes, uncomplicated: Secondary | ICD-10-CM | POA: Insufficient documentation

## 2019-07-14 DIAGNOSIS — J45909 Unspecified asthma, uncomplicated: Secondary | ICD-10-CM | POA: Insufficient documentation

## 2019-07-14 DIAGNOSIS — R52 Pain, unspecified: Secondary | ICD-10-CM

## 2019-07-14 DIAGNOSIS — M5410 Radiculopathy, site unspecified: Secondary | ICD-10-CM | POA: Insufficient documentation

## 2019-07-14 LAB — POCT PREGNANCY, URINE: Preg Test, Ur: NEGATIVE

## 2019-07-14 MED ORDER — OXYCODONE-ACETAMINOPHEN 5-325 MG PO TABS: 1.0000 | ORAL_TABLET | Freq: Four times a day (QID) | ORAL | 0 refills | Status: DC | PRN

## 2019-07-14 MED ORDER — KETOROLAC TROMETHAMINE 30 MG/ML IJ SOLN
30.0000 mg | Freq: Once | INTRAMUSCULAR | Status: AC
  Administered 2019-07-14: 30 mg via INTRAMUSCULAR
  Filled 2019-07-14: qty 1

## 2019-07-14 MED ORDER — PREDNISONE 10 MG PO TABS: ORAL_TABLET | ORAL | 0 refills | Status: DC

## 2019-07-14 MED ORDER — OXYCODONE-ACETAMINOPHEN 5-325 MG PO TABS
1.0000 | ORAL_TABLET | Freq: Once | ORAL | Status: AC
  Administered 2019-07-14: 11:00:00 1 via ORAL
  Filled 2019-07-14: qty 1

## 2019-07-14 NOTE — ED Provider Notes (Signed)
[facility] Emergency Department Provider Note  ____________________________________________   First MD Initiated Contact with Patient 07/14/19 [phone]     (approximate)  I have reviewed the triage vital signs and the nursing notes.   HISTORY  Chief Complaint Leg Pain   HPI Nicole Gross is a 34 y.o. female presents to the ED with complaint of right low back pain with radiation into her right leg that begins at her right hip for the last 2 days.  Patient states that she has had symptoms off and on but in the last 2 days is gotten worse.  She denies any urinary symptoms or injury to her right hip.  Patient states she has taken over-the-counter medication without any relief.  Patient has continued to ambulate without any assistance.  She rates her pain as a 10/10.     Past Medical History:  Diagnosis Date  . Asthma   . Ectopic pregnancy    x 2  . Pelvic inflammatory disease (PID)     Patient Active Problem List   Diagnosis Date Noted  . Overweight (BMI 25.0-29.9) 04/03/2018  . PID (acute pelvic inflammatory disease) 07/03/2016  . Female pelvic inflammatory disease 09/17/2015  . Leukocytosis 09/17/2015  . History of gastrointestinal bleeding 09/17/2015  . Anemia 09/17/2015  . Pyuria 09/17/2015  . Tobacco abuse 09/17/2015  . Epiploic appendagitis 09/15/2015  . Asthma 07/26/2004    Past Surgical History:  Procedure Laterality Date  . ECTOPIC PREGNANCY SURGERY     h/o 1 ectopic on each side    Prior to Admission medications   Medication Sig Start Date End Date Taking? Authorizing Provider  albuterol (PROVENTIL HFA;VENTOLIN HFA) 108 (90 Base) MCG/ACT inhaler Inhale 2 puffs into the lungs every 6 (six) hours as needed for wheezing or shortness of breath. 12/20/17   Darel Hong, MD  oxyCODONE-acetaminophen (PERCOCET) 5-325 MG tablet Take 1 tablet by mouth every 6 (six) hours as needed for severe pain. 07/14/19   Johnn Hai, PA-C  predniSONE  (DELTASONE) 10 MG tablet Take 6 tablets  today, on day 2 take 5 tablets, day 3 take 4 tablets, day 4 take 3 tablets, day 5 take  2 tablets and 1 tablet the last day 07/14/19   Johnn Hai, PA-C  Spacer/Aero Chamber Mouthpiece MISC 1 Units by Does not apply route every 4 (four) hours as needed (wheezing). 12/20/17   Darel Hong, MD    Allergies Amoxicillin, Ciprofloxacin, Penicillins, and Codeine  Family History  Problem Relation Age of Onset  . Hypertension Mother   . Diabetes Mother   . Asthma Mother   . Hypertension Father   . Diabetes Father   . Gout Father   . Asthma Sister   . Hypertension Maternal Grandmother   . Gout Paternal Grandmother   . Alzheimer's disease Paternal Grandmother   . Heart failure Paternal Grandmother     Social History Social History   Tobacco Use  . Smoking status: Current Every Day Smoker    Packs/day: 0.50    Years: 10.00    Pack years: 5.00    Types: Cigarettes  . Smokeless tobacco: Never Used  Substance Use Topics  . Alcohol use: Yes    Comment: occ  . Drug use: Yes    Types: Marijuana    Review of Systems Constitutional: No fever/chills Cardiovascular: Denies chest pain. Respiratory: Denies shortness of breath. Gastrointestinal: No abdominal pain.  No nausea, no vomiting.  No diarrhea.  Genitourinary: Negative for dysuria.  Musculoskeletal: Positive for right hip pain with right leg radiculopathy. Skin: Negative for rash. Neurological: Negative for headaches, focal weakness or numbness. ____________________________________________   PHYSICAL EXAM:  VITAL SIGNS: ED Triage Vitals  Enc Vitals Group     BP 07/14/19 0805 116/86     Pulse Rate 07/14/19 0805 81     Resp 07/14/19 0805 16     Temp 07/14/19 0805 98.2 F (36.8 C)     Temp Source 07/14/19 0805 Oral     SpO2 07/14/19 0805 100 %     Weight 07/14/19 0806 150 lb (68 kg)     Height 07/14/19 0806 4\' 11"  (1.499 m)     Head Circumference --      Peak Flow --       Pain Score 07/14/19 0806 10     Pain Loc --      Pain Edu? --      Excl. in [place]? --    Constitutional: Alert and oriented. Well appearing and in no acute distress. Eyes: Conjunctivae are normal.  Head: Atraumatic. Neck: No stridor.   Cardiovascular: Normal rate, regular rhythm. Grossly normal heart sounds.  Good peripheral circulation. Respiratory: Normal respiratory effort.  No retractions. Lungs CTAB. Gastrointestinal: Soft and nontender. No distention.  Musculoskeletal: Examination of the back there is no gross deformity and no point tenderness on palpation or step-offs noted.  Range of motion is restricted secondary to discomfort.  There is moderate tenderness on palpation of the right hip with range of motion being restricted due to increased pain.  No gross deformity was noted.  No discoloration or soft tissue injury is present.  Muscle strength bilaterally.  Patient is ambulatory without assistance. Neurologic:  Normal speech and language. No gross focal neurologic deficits are appreciated.  Skin:  Skin is warm, dry and intact. No rash noted. Psychiatric: Mood and affect are normal. Speech and behavior are normal.  ____________________________________________   LABS (all labs ordered are listed, but only abnormal results are displayed)  Labs Reviewed  POC URINE PREG, ED  POCT PREGNANCY, URINE   ____________________________________________  RADIOLOGY  Official radiology report(s): DG HIP UNILAT WITH PELVIS 2-3 VIEWS RIGHT  Result Date: 07/14/2019 CLINICAL DATA:  Right hip and groin pain for 2 days. No known injury. EXAM: DG HIP (WITH OR WITHOUT PELVIS) 2-3V RIGHT COMPARISON:  CT abdomen and pelvis 09/15/2015. FINDINGS: There is no acute bony or joint abnormality. Joint spaces are preserved. Os acetabuli along the periphery of the right acetabulum is incidentally noted. No lytic or sclerotic lesion. Soft tissues appear normal. IMPRESSION: Negative exam. Electronically Signed   By:  Inge Rise M.D.   On: 07/14/2019 10:30    ____________________________________________   PROCEDURES  Procedure(s) performed (including Critical Care):  Procedures   ____________________________________________   INITIAL IMPRESSION / ASSESSMENT AND PLAN / ED COURSE  As part of my medical decision making, I reviewed the following data within the electronic MEDICAL RECORD NUMBER Notes from prior ED visits and [place] Controlled Substance Database. Ahmiah Bidgood was evaluated in Emergency Department on 07/14/2019 for the symptoms described in the history of present illness. She was evaluated in the context of the global COVID-19 pandemic, which necessitated consideration that the patient might be at risk for infection with the SARS-CoV-2 virus that causes COVID-19. Institutional protocols and algorithms that pertain to the evaluation of patients at risk for COVID-19 are in a state of rapid change based on information released by regulatory bodies including the CDC and federal  and state organizations. These policies and algorithms were followed during the patient's care in the ED.  34 year old female presents to the ED with complaint of right hip and right lower leg radiculopathy for the last 2 days.  Patient denies any history of injury.  She has been taking over-the-counter medication without any improvement.  While in the ED patient was given Percocet 5 mg and Toradol 30 mg IM which gave her minimal relief.  X-rays were reassuring and patient was made aware.  Patient was given a prescription for prednisone 6-day taper along with continued oxycodone for pain.  Patient is encouraged to use ice or heat to her hip as needed for discomfort and made aware that most likely this is coming from her lower back.  She is to follow-up with Dr. Roland Rack if any continued problems with her hip and radiculopathy or follow-up with her PCP.  Patient was ambulatory at the time of  discharge.  ____________________________________________   FINAL CLINICAL IMPRESSION(S) / ED DIAGNOSES  Final diagnoses:  Acute hip pain, right  Radiculopathy of leg     ED Discharge Orders         Ordered    predniSONE (DELTASONE) 10 MG tablet     07/14/19 1154    oxyCODONE-acetaminophen (PERCOCET) 5-325 MG tablet  Every 6 hours PRN     07/14/19 1154           Note:  This document was prepared using Dragon voice recognition software and may include unintentional dictation errors.    Johnn Hai, PA-C 07/14/19 1545    Arta Silence, MD 07/14/19 1600

## 2019-07-14 NOTE — Discharge Instructions (Signed)
Follow-up with your primary care provider or Dr. Roland Rack who is on-call for orthopedics if any continued problems with your hip.  Begin taking the prednisone beginning with 6 6 tablets today and tapering down over the next 6 days to 1 tablet.  Oxycodone was prescribed for your pain.  Take this only as needed for severe pain.  Do not drive or operate machinery while taking this medication.  You may use ice or heat to your hip as needed for discomfort.

## 2019-07-14 NOTE — ED Notes (Signed)
See triage note  Presents with pain to right lower abd   Radiates into right groin area and down inside right leg  Denies any injury or urinary sx's  States pain started 2 days ago

## 2019-07-14 NOTE — ED Triage Notes (Signed)
Pt reports rt leg pain x 2 days- pain begins at hip area and radiates down leg. Denies injury.

## 2019-07-16 ENCOUNTER — Emergency Department
Admission: EM | Admit: 2019-07-16 | Discharge: 2019-07-16 | Disposition: A | Discharge status: Home / Self Care | Payer: Self-pay | Attending: Emergency Medicine | Admitting: Emergency Medicine

## 2019-07-16 ENCOUNTER — Encounter: Payer: Self-pay | Admitting: Emergency Medicine

## 2019-07-16 ENCOUNTER — Other Ambulatory Visit: Payer: Self-pay

## 2019-07-16 ENCOUNTER — Emergency Department: Payer: Self-pay

## 2019-07-16 DIAGNOSIS — F17210 Nicotine dependence, cigarettes, uncomplicated: Secondary | ICD-10-CM | POA: Insufficient documentation

## 2019-07-16 DIAGNOSIS — M659 Synovitis and tenosynovitis, unspecified: Secondary | ICD-10-CM | POA: Insufficient documentation

## 2019-07-16 DIAGNOSIS — J45909 Unspecified asthma, uncomplicated: Secondary | ICD-10-CM | POA: Insufficient documentation

## 2019-07-16 DIAGNOSIS — Z20822 Contact with and (suspected) exposure to covid-19: Secondary | ICD-10-CM | POA: Insufficient documentation

## 2019-07-16 LAB — CBC WITH DIFFERENTIAL/PLATELET
Abs Immature Granulocytes: 0.08 10*3/uL — ABNORMAL HIGH (ref 0.00–0.07)
Basophils Absolute: 0.1 10*3/uL (ref 0.0–0.1)
Basophils Relative: 0 %
Eosinophils Absolute: 0.2 10*3/uL (ref 0.0–0.5)
Eosinophils Relative: 1 %
HCT: 40.5 % (ref 36.0–46.0)
Hemoglobin: 13.3 g/dL (ref 12.0–15.0)
Immature Granulocytes: 1 %
Lymphocytes Relative: 5 %
Lymphs Abs: 0.9 10*3/uL (ref 0.7–4.0)
MCH: 30.6 pg (ref 26.0–34.0)
MCHC: 32.8 g/dL (ref 30.0–36.0)
MCV: 93.3 fL (ref 80.0–100.0)
Monocytes Absolute: 0.2 10*3/uL (ref 0.1–1.0)
Monocytes Relative: 1 %
Neutro Abs: 15.4 10*3/uL — ABNORMAL HIGH (ref 1.7–7.7)
Neutrophils Relative %: 92 %
Platelets: 334 10*3/uL (ref 150–400)
RBC: 4.34 MIL/uL (ref 3.87–5.11)
RDW: 12.8 % (ref 11.5–15.5)
WBC: 16.7 10*3/uL — ABNORMAL HIGH (ref 4.0–10.5)
nRBC: 0 % (ref 0.0–0.2)

## 2019-07-16 LAB — COMPREHENSIVE METABOLIC PANEL
ALT: 19 U/L (ref 0–44)
AST: 24 U/L (ref 15–41)
Albumin: 3.2 g/dL — ABNORMAL LOW (ref 3.5–5.0)
Alkaline Phosphatase: 60 U/L (ref 38–126)
Anion gap: 7 (ref 5–15)
BUN: 13 mg/dL (ref 6–20)
CO2: 25 mmol/L (ref 22–32)
Calcium: 8.8 mg/dL — ABNORMAL LOW (ref 8.9–10.3)
Chloride: 106 mmol/L (ref 98–111)
Creatinine, Ser: 0.84 mg/dL (ref 0.44–1.00)
GFR calc Af Amer: >60 mL/min (ref >60)
GFR calc non Af Amer: >60 mL/min (ref >60)
Glucose, Bld: 123 mg/dL — ABNORMAL HIGH (ref 70–99)
Potassium: 3.9 mmol/L (ref 3.5–5.1)
Sodium: 138 mmol/L (ref 135–145)
Total Bilirubin: 0.6 mg/dL (ref 0.3–1.2)
Total Protein: 6.4 g/dL — ABNORMAL LOW (ref 6.5–8.1)

## 2019-07-16 LAB — C-REACTIVE PROTEIN: CRP: 4.8 mg/dL — ABNORMAL HIGH (ref <1.0)

## 2019-07-16 LAB — RESPIRATORY PANEL BY RT PCR (FLU A&B, COVID)
Influenza A by PCR: NEGATIVE
Influenza B by PCR: NEGATIVE
SARS Coronavirus 2 by RT PCR: NEGATIVE

## 2019-07-16 LAB — SEDIMENTATION RATE: Sed Rate: 15 mm/hr (ref 0–20)

## 2019-07-16 MED ORDER — ONDANSETRON HCL 4 MG/2ML IJ SOLN
4.0000 mg | Freq: Once | INTRAMUSCULAR | Status: AC
  Administered 2019-07-16: 4 mg via INTRAVENOUS
  Filled 2019-07-16: qty 2

## 2019-07-16 MED ORDER — MORPHINE SULFATE (PF) 4 MG/ML IV SOLN
4.0000 mg | Freq: Once | INTRAVENOUS | Status: AC
  Administered 2019-07-16: 4 mg via INTRAVENOUS
  Filled 2019-07-16: qty 1

## 2019-07-16 MED ORDER — FENTANYL CITRATE (PF) 100 MCG/2ML IJ SOLN
100.0000 ug | Freq: Once | INTRAMUSCULAR | Status: AC
  Administered 2019-07-16: 19:00:00 100 ug via INTRAVENOUS
  Filled 2019-07-16: qty 2

## 2019-07-16 MED ORDER — OXYCODONE HCL 5 MG PO TABS
5.0000 mg | ORAL_TABLET | Freq: Once | ORAL | Status: AC
  Administered 2019-07-16: 5 mg via ORAL
  Filled 2019-07-16: qty 1

## 2019-07-16 NOTE — Consult Note (Signed)
Reason for Consult:right hip pain Referring Physician: Dr Mervyn Skeeters is an 33 y.o. female.  HPI: Patient is a 34 year old female who had sudden onset of pain in the right hip 3 to 4 days ago.  She came to the emergency room 2 days ago and had x-rays which were normal was given Percocet and prednisone without relief.  She came back tonight continued to have pain and difficulty with walking.  Pain is in the anterior hip it does not radiate down the leg she denies any neurovascular problems or bowel or bladder dysfunction or any back pain with this.  She has not had prior hip right hip problems she denies any hip injury any recent infections or accidents.  Past Medical History:  Diagnosis Date  . Asthma   . Ectopic pregnancy    x 2  . Pelvic inflammatory disease (PID)     Past Surgical History:  Procedure Laterality Date  . ECTOPIC PREGNANCY SURGERY     h/o 1 ectopic on each side    Family History  Problem Relation Age of Onset  . Hypertension Mother   . Diabetes Mother   . Asthma Mother   . Hypertension Father   . Diabetes Father   . Gout Father   . Asthma Sister   . Hypertension Maternal Grandmother   . Gout Paternal Grandmother   . Alzheimer's disease Paternal Grandmother   . Heart failure Paternal Grandmother     Social History:  reports that she has been smoking cigarettes. She has a 5.00 pack-year smoking history. She has never used smokeless tobacco. She reports current alcohol use. She reports current drug use. Drug: Marijuana.  Allergies:  Allergies  Allergen Reactions  . Amoxicillin Anaphylaxis and Rash  . Ciprofloxacin Shortness Of Breath and Itching  . Penicillins Anaphylaxis and Rash    Has patient had a PCN reaction causing immediate rash, facial/tongue/throat swelling, SOB or lightheadedness with hypotension: yes: Has patient had a PCN reaction causing severe rash involving mucus membranes or skin necrosis: no Has patient had a PCN reaction that  required hospitalization no Has patient had a PCN reaction occurring within the last 10 years: no1} If all of the above answers are "NO", then may proceed with Cephalosporin use.   . Codeine     Medications: I have reviewed the patient's current medications.  Results for orders placed or performed during the hospital encounter of 07/16/19 (from the past 48 hour(s))  Comprehensive metabolic panel     Status: Abnormal   Collection Time: 07/16/19  6:55 PM  Result Value Ref Range   Sodium 138 135 - 145 mmol/L   Potassium 3.9 3.5 - 5.1 mmol/L   Chloride 106 98 - 111 mmol/L   CO2 25 22 - 32 mmol/L   Glucose, Bld 123 (H) 70 - 99 mg/dL   BUN 13 6 - 20 mg/dL   Creatinine, Ser 0.84 0.44 - 1.00 mg/dL   Calcium 8.8 (L) 8.9 - 10.3 mg/dL   Total Protein 6.4 (L) 6.5 - 8.1 g/dL   Albumin 3.2 (L) 3.5 - 5.0 g/dL   AST 24 15 - 41 U/L   ALT 19 0 - 44 U/L   Alkaline Phosphatase 60 38 - 126 U/L   Total Bilirubin 0.6 0.3 - 1.2 mg/dL   GFR calc non Af Amer >60 >60 mL/min   GFR calc Af Amer >60 >60 mL/min   Anion gap 7 5 - 15    Comment: Performed at [facility], [place], [place] [address]  CBC with Differential     Status: Abnormal   Collection Time: 07/16/19  6:55 PM  Result Value Ref Range   WBC 16.7 (H) 4.0 - 10.5 K/uL   RBC 4.34 3.87 - 5.11 MIL/uL   Hemoglobin 13.3 12.0 - 15.0 g/dL   HCT 40.5 36.0 - 46.0 %   MCV 93.3 80.0 - 100.0 fL   MCH 30.6 26.0 - 34.0 pg   MCHC 32.8 30.0 - 36.0 g/dL   RDW 12.8 11.5 - 15.5 %   Platelets 334 150 - 400 K/uL   nRBC 0.0 0.0 - 0.2 %   Neutrophils Relative % 92 %   Neutro Abs 15.4 (H) 1.7 - 7.7 K/uL   Lymphocytes Relative 5 %   Lymphs Abs 0.9 0.7 - 4.0 K/uL   Monocytes Relative 1 %   Monocytes Absolute 0.2 0.1 - 1.0 K/uL   Eosinophils Relative 1 %   Eosinophils Absolute 0.2 0.0 - 0.5 K/uL   Basophils Relative 0 %   Basophils Absolute 0.1 0.0 - 0.1 K/uL   Immature Granulocytes 1 %   Abs Immature Granulocytes 0.08 (H) 0.00 -  0.07 K/uL    Comment: Performed at [facility], [place], [place] [address]    CT Hip Right Wo Contrast  Result Date: 07/16/2019 CLINICAL DATA:  Right hip pain for several days EXAM: CT OF THE RIGHT HIP WITHOUT CONTRAST TECHNIQUE: Multidetector CT imaging of the right hip was performed according to the standard protocol. Multiplanar CT image reconstructions were also generated. COMPARISON:  Plain film from 07/14/2019 FINDINGS: Bones/Joint/Cartilage Some chronic degenerative changes of the acetabulum on the right are seen. Visualized bony structures otherwise are within normal limits. Ligaments Suboptimally assessed by CT. Muscles and Tendons Surrounding musculature appears within normal limits. Soft tissues There is evidence of a mild left hip joint effusion identified both anteriorly and posteriorly. No other focal soft tissue abnormality is noted. IMPRESSION: No evidence of acute fracture noted. Right-sided hip joint effusion.  Arthrocentesis may be helpful. Electronically Signed   By: Inez Catalina M.D.   On: 07/16/2019 18:13    Review of Systems Blood pressure 119/88, pulse 98, temperature 98.2 F (36.8 C), temperature source Oral, resp. rate 18, last menstrual period 07/10/2019, SpO2 100 %. Physical Exam she holds the hip in about 10 degrees of flexion without external rotation she is slight tenderness over the direct anterior hip nontender over the trochanter.  She can be passively ranged very slowly with some pain to up abduction about 30 degrees internal rotation of 20 degrees and external rotation of 30 degrees but this is painful she is able to bear weight and walks with an antalgic gait with the hip and knee flexed.  She had a CT scan that was reviewed as well showing an effusion  Assessment/Plan: Probable synovitis of the hip I doubt this is infectious based on the fact that she is able to bear weight and I could move her hip without extreme pain and she does not  hold it in that much flexion.  We are pending a sed rate.  If it is not too high I would want her to see Dr. Candelaria Stagers in our office tomorrow for hip injection and I will pass that information on to him.  If it her sed rate is very elevated we would need to consider possible septic joint that would require urgent surgery to for irrigation irrigation  and drainage.  Hessie Knows 07/16/2019, 9:00 PM

## 2019-07-16 NOTE — ED Notes (Signed)
Pt ambulatory to bathroom independently

## 2019-07-16 NOTE — Discharge Instructions (Signed)
Please call the office tomorrow and let them known that Dr. Rudene Christians saw you in the ER tonight and wants you to see Dr. Candelaria Stagers.   Take your percocet as prescribed.

## 2019-07-16 NOTE — ED Provider Notes (Signed)
[facility] Emergency Department Provider Note ____________________________________________  Time seen: Approximately 9:01 PM  I have reviewed the triage vital signs and the nursing notes.   HISTORY  Chief Complaint Hip Pain    HPI Nicole Gross is a 34 y.o. female who presents to the emergency department for evaluation and treatment of right hip pain. No known injury. Pain was present when she woke up 4 days ago. She has never experienced similar pain in the past. She was evaluated here 2 days ago and treated with prednisone and percocet which are not helping. Pain has gotten worse. She is unable to ambulate without severe pain. No fevers or other symptoms of concern.  Past Medical History:  Diagnosis Date  . Asthma   . Ectopic pregnancy    x 2  . Pelvic inflammatory disease (PID)     Patient Active Problem List   Diagnosis Date Noted  . Overweight (BMI 25.0-29.9) 04/03/2018  . PID (acute pelvic inflammatory disease) 07/03/2016  . Female pelvic inflammatory disease 09/17/2015  . Leukocytosis 09/17/2015  . History of gastrointestinal bleeding 09/17/2015  . Anemia 09/17/2015  . Pyuria 09/17/2015  . Tobacco abuse 09/17/2015  . Epiploic appendagitis 09/15/2015  . Asthma 07/26/2004    Past Surgical History:  Procedure Laterality Date  . ECTOPIC PREGNANCY SURGERY     h/o 1 ectopic on each side    Prior to Admission medications   Medication Sig Start Date End Date Taking? Authorizing Provider  albuterol (PROVENTIL HFA;VENTOLIN HFA) 108 (90 Base) MCG/ACT inhaler Inhale 2 puffs into the lungs every 6 (six) hours as needed for wheezing or shortness of breath. 12/20/17   Darel Hong, MD  oxyCODONE-acetaminophen (PERCOCET) 5-325 MG tablet Take 1 tablet by mouth every 6 (six) hours as needed for severe pain. 07/14/19   Johnn Hai, PA-C  predniSONE (DELTASONE) 10 MG tablet Take 6 tablets  today, on day 2 take 5 tablets, day 3 take 4 tablets, day 4  take 3 tablets, day 5 take  2 tablets and 1 tablet the last day 07/14/19   Johnn Hai, PA-C  Spacer/Aero Chamber Mouthpiece MISC 1 Units by Does not apply route every 4 (four) hours as needed (wheezing). 12/20/17   Darel Hong, MD    Allergies Amoxicillin, Ciprofloxacin, Penicillins, and Codeine  Family History  Problem Relation Age of Onset  . Hypertension Mother   . Diabetes Mother   . Asthma Mother   . Hypertension Father   . Diabetes Father   . Gout Father   . Asthma Sister   . Hypertension Maternal Grandmother   . Gout Paternal Grandmother   . Alzheimer's disease Paternal Grandmother   . Heart failure Paternal Grandmother     Social History Social History   Tobacco Use  . Smoking status: Current Every Day Smoker    Packs/day: 0.50    Years: 10.00    Pack years: 5.00    Types: Cigarettes  . Smokeless tobacco: Never Used  Substance Use Topics  . Alcohol use: Yes    Comment: occ  . Drug use: Yes    Types: Marijuana    Review of Systems Constitutional: Negative for fever. Cardiovascular: Negative for chest pain. Respiratory: Negative for shortness of breath. Musculoskeletal: Positive for right hip pain Skin: Negative for open wound or lesion.  Neurological: Negative for decrease in sensation  ____________________________________________   PHYSICAL EXAM:  VITAL SIGNS: ED Triage Vitals  Enc Vitals Group     BP 07/16/19 1638  119/88     Pulse Rate 07/16/19 1638 98     Resp 07/16/19 1638 18     Temp 07/16/19 1638 98.2 F (36.8 C)     Temp Source 07/16/19 1638 Oral     SpO2 07/16/19 1638 100 %     Weight --      Height --      Head Circumference --      Peak Flow --      Pain Score 07/16/19 1643 10     Pain Loc --      Pain Edu? --      Excl. in [place]? --     Constitutional: Alert and oriented. Well appearing and in no acute distress. Eyes: Conjunctivae are clear without discharge or drainage Head: Atraumatic Neck: Supple Respiratory: No  cough. Respirations are even and unlabored. Musculoskeletal: Focal tenderness in the anterior right hip/groin area.  Neurologic: Motor and sensory function intact.  Skin: No open wounds, lesions, contusions,   Psychiatric: Affect and behavior are appropriate.  ____________________________________________   LABS (all labs ordered are listed, but only abnormal results are displayed)  Labs Reviewed  COMPREHENSIVE METABOLIC PANEL - Abnormal; Notable for the following components:      Result Value   Glucose, Bld 123 (*)    Calcium 8.8 (*)    Total Protein 6.4 (*)    Albumin 3.2 (*)    All other components within normal limits  CBC WITH DIFFERENTIAL/PLATELET - Abnormal; Notable for the following components:   WBC 16.7 (*)    Neutro Abs 15.4 (*)    Abs Immature Granulocytes 0.08 (*)    All other components within normal limits  C-REACTIVE PROTEIN - Abnormal; Notable for the following components:   CRP 4.8 (*)    All other components within normal limits  RESPIRATORY PANEL BY RT PCR (FLU A&B, COVID)  SEDIMENTATION RATE   ____________________________________________  RADIOLOGY  Right side hip joint effusion ____________________________________________   PROCEDURES  Procedures  ____________________________________________   INITIAL IMPRESSION / ASSESSMENT AND PLAN / ED COURSE  Nicole Gross is a 34 y.o. who presents to the emergency department for treatment and evaluation of right hip pain. She was here 2 days ago for the same and was treated with prednisone and percocet. No relief. Pain is getting worse.   ----------------------------------------- 7:30 PM on 07/16/2019 -----------------------------------------  Case discussed with Dr. Rudene Christians who requested that a CRP and Sed Rate be collected.  Results of the CT discussed with the patient.   ----------------------------------------- 9:03 PM on 07/16/2019 -----------------------------------------  Dr. Rudene Christians in with the  patient. Awaiting CRP and Sed Rate results which will determine whether or not she will go to the OR tonight. If not, the plan will be to have her come to the office tomorrow for an ultrasound guided aspiration and injection. He feels that she likely has a synovitis.  ----------------------------------------- 9:38 PM on 07/16/2019 -----------------------------------------  Plan will be to discharge patient home to follow up with Dr. Candelaria Stagers tomorrow. Dr. Rudene Christians will make him aware of the patient and plan.  Pain fairly well controlled at this time as long as she is not moving around.  She will continue taking her Percocet that she has at home.  She is to make sure she calls in the morning to schedule her appointment and the information was all provided her discharge papers.  She denied questions prior to discharge.   Medications  fentaNYL (SUBLIMAZE) injection 100 mcg (100 mcg Intravenous Given 07/16/19  1853)  morphine 4 MG/ML injection 4 mg (4 mg Intravenous Given 07/16/19 2108)  ondansetron (ZOFRAN) injection 4 mg (4 mg Intravenous Given 07/16/19 2108)  oxyCODONE (Oxy IR/ROXICODONE) immediate release tablet 5 mg (5 mg Oral Given 07/16/19 2236)    Pertinent labs & imaging results that were available during my care of the patient were reviewed by me and considered in my medical decision making (see chart for details).  _________________________________________   FINAL CLINICAL IMPRESSION(S) / ED DIAGNOSES  Final diagnoses:  Synovitis of hip    ED Discharge Orders    None       If controlled substance prescribed during this visit, 12 month history viewed on the [place] prior to issuing an initial prescription for Schedule II or III opiod.   Victorino Dike, FNP 07/16/19 2312    Vanessa [place], MD 07/17/19 1505

## 2019-07-16 NOTE — ED Notes (Signed)
Pt stating she wants something for pain before she leaves. NP made aware. Pt states her boyfriend is transporting her home.

## 2019-07-16 NOTE — ED Triage Notes (Signed)
Pt  Presents to ED via POV with c/o R hip pain, states seen 2 days ago for same, denies new injury since being seen. Pt with negative X-ray 2 days ago, pt states pain medications prescribed aren't working for her.

## 2019-07-17 ENCOUNTER — Other Ambulatory Visit
Admission: RE | Admit: 2019-07-17 | Discharge: 2019-07-17 | Disposition: A | Discharge status: Home / Self Care | Payer: Self-pay | Source: Ambulatory Visit | Attending: Sports Medicine | Admitting: Sports Medicine

## 2019-07-17 DIAGNOSIS — M25551 Pain in right hip: Secondary | ICD-10-CM | POA: Insufficient documentation

## 2019-07-17 DIAGNOSIS — R7982 Elevated C-reactive protein (CRP): Secondary | ICD-10-CM | POA: Insufficient documentation

## 2019-07-17 DIAGNOSIS — M25451 Effusion, right hip: Secondary | ICD-10-CM | POA: Insufficient documentation

## 2019-07-17 LAB — SYNOVIAL CELL COUNT + DIFF, W/ CRYSTALS
Crystals, Fluid: NONE SEEN
Eosinophils-Synovial: 0 %
Lymphocytes-Synovial Fld: 6 %
Monocyte-Macrophage-Synovial Fluid: 6 %
Neutrophil, Synovial: 88 %
WBC, Synovial: 12324 /mm3 — ABNORMAL HIGH (ref 0–200)

## 2019-10-18 ENCOUNTER — Other Ambulatory Visit: Payer: Self-pay

## 2019-10-18 ENCOUNTER — Encounter: Payer: Self-pay | Admitting: *Deleted

## 2019-10-18 ENCOUNTER — Emergency Department: Payer: Self-pay

## 2019-10-18 DIAGNOSIS — J45909 Unspecified asthma, uncomplicated: Secondary | ICD-10-CM | POA: Insufficient documentation

## 2019-10-18 DIAGNOSIS — F17210 Nicotine dependence, cigarettes, uncomplicated: Secondary | ICD-10-CM | POA: Insufficient documentation

## 2019-10-18 DIAGNOSIS — M25572 Pain in left ankle and joints of left foot: Secondary | ICD-10-CM | POA: Insufficient documentation

## 2019-10-18 NOTE — ED Triage Notes (Signed)
Pt to ED reporting left ankle swelling with pain when walking. Pt denies known injury but walks a lot at work. Pt limping upon arrival.

## 2019-10-19 ENCOUNTER — Emergency Department
Admission: EM | Admit: 2019-10-19 | Discharge: 2019-10-19 | Disposition: A | Discharge status: Home / Self Care | Payer: Self-pay | Attending: Emergency Medicine | Admitting: Emergency Medicine

## 2019-10-19 DIAGNOSIS — M25572 Pain in left ankle and joints of left foot: Secondary | ICD-10-CM

## 2019-10-19 MED ORDER — IBUPROFEN 600 MG PO TABS: 600.0000 mg | ORAL_TABLET | Freq: Once | ORAL | Status: DC

## 2019-10-19 MED ORDER — HYDROCODONE-ACETAMINOPHEN 5-325 MG PO TABS: 1.0000 | ORAL_TABLET | Freq: Four times a day (QID) | ORAL | 0 refills | Status: DC | PRN

## 2019-10-19 MED ORDER — IBUPROFEN 600 MG PO TABS: 600.0000 mg | ORAL_TABLET | Freq: Three times a day (TID) | ORAL | 0 refills | Status: DC | PRN

## 2019-10-19 NOTE — Discharge Instructions (Signed)
1.  You may take pain medicines as needed (Motrin/Norco #15). 2.  Wear splint as needed for comfort.  Elevate affected area and apply ice several times daily. 3.  Return to the ER for worsening symptoms, persistent vomiting, difficulty breathing or other concerns.

## 2019-10-19 NOTE — ED Provider Notes (Signed)
[facility] Emergency Department Provider Note   ____________________________________________   First MD Initiated Contact with Patient 10/19/19 [phone]     (approximate)  I have reviewed the triage vital signs and the nursing notes.   HISTORY  Chief Complaint Foot Pain    HPI Arwen Delfino is a 34 y.o. female who presents to the ED from home with a chief complaint of nontraumatic left ankle pain and swelling.  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  Also has had a prior history of gout.  Presents with swelling to her left medial malleolus and pain on ambulation.  Denies fall/injury/trauma.  Voices no other complaints or injuries.       Past Medical History:  Diagnosis Date  . Asthma   . Ectopic pregnancy    x 2  . Pelvic inflammatory disease (PID)     Patient Active Problem List   Diagnosis Date Noted  . Overweight (BMI 25.0-29.9) 04/03/2018  . PID (acute pelvic inflammatory disease) 07/03/2016  . Female pelvic inflammatory disease 09/17/2015  . Leukocytosis 09/17/2015  . History of gastrointestinal bleeding 09/17/2015  . Anemia 09/17/2015  . Pyuria 09/17/2015  . Tobacco abuse 09/17/2015  . Epiploic appendagitis 09/15/2015  . Asthma 07/26/2004    Past Surgical History:  Procedure Laterality Date  . ECTOPIC PREGNANCY SURGERY     h/o 1 ectopic on each side    Prior to Admission medications   Medication Sig Start Date End Date Taking? Authorizing Provider  albuterol (PROVENTIL HFA;VENTOLIN HFA) 108 (90 Base) MCG/ACT inhaler Inhale 2 puffs into the lungs every 6 (six) hours as needed for wheezing or shortness of breath. 12/20/17   Darel Hong, MD  HYDROcodone-acetaminophen (NORCO) 5-325 MG tablet Take 1 tablet by mouth every 6 (six) hours as needed for moderate pain. 10/19/19   Paulette Blanch, MD  ibuprofen (ADVIL) 600 MG tablet Take 1 tablet (600 mg total) by mouth every 8 (eight) hours as  needed. 10/19/19   Paulette Blanch, MD  oxyCODONE-acetaminophen (PERCOCET) 5-325 MG tablet Take 1 tablet by mouth every 6 (six) hours as needed for severe pain. 07/14/19   Johnn Hai, PA-C  predniSONE (DELTASONE) 10 MG tablet Take 6 tablets  today, on day 2 take 5 tablets, day 3 take 4 tablets, day 4 take 3 tablets, day 5 take  2 tablets and 1 tablet the last day 07/14/19   Johnn Hai, PA-C  Spacer/Aero Chamber Mouthpiece MISC 1 Units by Does not apply route every 4 (four) hours as needed (wheezing). 12/20/17   Darel Hong, MD    Allergies Amoxicillin, Ciprofloxacin, Penicillins, and Codeine  Family History  Problem Relation Age of Onset  . Hypertension Mother   . Diabetes Mother   . Asthma Mother   . Hypertension Father   . Diabetes Father   . Gout Father   . Asthma Sister   . Hypertension Maternal Grandmother   . Gout Paternal Grandmother   . Alzheimer's disease Paternal Grandmother   . Heart failure Paternal Grandmother     Social History Social History   Tobacco Use  . Smoking status: Current Every Day Smoker    Packs/day: 0.50    Years: 10.00    Pack years: 5.00    Types: Cigarettes  . Smokeless tobacco: Never Used  Substance Use Topics  . Alcohol use: Yes    Comment: occ  . Drug use: Yes  Types: Marijuana    Review of Systems  Constitutional: No fever/chills Eyes: No visual changes. ENT: No sore throat. Cardiovascular: Denies chest pain. Respiratory: Denies shortness of breath. Gastrointestinal: No abdominal pain.  No nausea, no vomiting.  No diarrhea.  No constipation. Genitourinary: Negative for dysuria. Musculoskeletal: Positive for left ankle pain.  Negative for back pain. Skin: Negative for rash. Neurological: Negative for headaches, focal weakness or numbness.   ____________________________________________   PHYSICAL EXAM:  VITAL SIGNS: ED Triage Vitals [10/18/19 2318]  Enc Vitals Group     BP 121/68     Pulse Rate 89     Resp 16      Temp 98.3 F (36.8 C)     Temp Source Oral     SpO2 98 %     Weight 142 lb (64.4 kg)     Height 4\' 11"  (1.499 m)     Head Circumference      Peak Flow      Pain Score 6     Pain Loc      Pain Edu?      Excl. in [place]?     Constitutional: Alert and oriented. Well appearing and in no acute distress. Eyes: Conjunctivae are normal. PERRL. EOMI. Head: Atraumatic. Nose: No congestion/rhinnorhea. Mouth/Throat: Mucous membranes are moist.  Oropharynx non-erythematous. Neck: No stridor.   Cardiovascular: Normal rate, regular rhythm. Grossly normal heart sounds.  Good peripheral circulation. Respiratory: Normal respiratory effort.  No retractions. Lungs CTAB. Gastrointestinal: Soft and nontender. No distention. No abdominal bruits. No CVA tenderness. Musculoskeletal: Mild swelling to left medial malleolus.  No surrounding warmth or erythema.  Full range of motion with slight pain.  2+ distal pulses.  Brisk, less than 5-second capillary refill.   Neurologic:  Normal speech and language. No gross focal neurologic deficits are appreciated. No gait instability. Skin:  Skin is warm, dry and intact. No rash noted. Psychiatric: Mood and affect are normal. Speech and behavior are normal.  ____________________________________________   LABS (all labs ordered are listed, but only abnormal results are displayed)  Labs Reviewed - No data to display ____________________________________________  EKG  None ____________________________________________  RADIOLOGY  ED MD interpretation: No osseous abnormality  Official radiology report(s): DG Ankle Complete Left  Result Date: 10/18/2019 CLINICAL DATA:  Left ankle pain and swelling. EXAM: LEFT ANKLE COMPLETE - 3+ VIEW COMPARISON:  None. FINDINGS: There is no evidence of fracture, dislocation, or joint effusion. There is no evidence of arthropathy or other focal bone abnormality. Soft tissues are unremarkable. IMPRESSION: Negative. Electronically  Signed   By: Virgina Norfolk M.D.   On: 10/18/2019 23:40    ____________________________________________   PROCEDURES  Procedure(s) performed (including Critical Care):  Procedures   ____________________________________________   INITIAL IMPRESSION / ASSESSMENT AND PLAN / ED COURSE  As part of my medical decision making, I reviewed the following data within the [place] notes reviewed and incorporated, Old chart reviewed, Radiograph reviewed, Notes from prior ED visits and [place] Controlled Substance Database     Eurydice Wormuth was evaluated in Emergency Department on 10/19/2019 for the symptoms described in the history of present illness. She was evaluated in the context of the global COVID-19 pandemic, which necessitated consideration that the patient might be at risk for infection with the SARS-CoV-2 virus that causes COVID-19. Institutional protocols and algorithms that pertain to the evaluation of patients at risk for COVID-19 are in a state of rapid change based on information released by regulatory bodies  including the CDC and federal and state organizations. These policies and algorithms were followed during the patient's care in the ED.    34 year old female who presents with nontraumatic left ankle pain and swelling. X-rays unremarkable.  Will place in stirrup splint, NSAIDs, analgesia and orthopedics follow-up as needed.  Strict return precautions given.  Patient verbalizes understanding and agrees with plan of care.      ____________________________________________   FINAL CLINICAL IMPRESSION(S) / ED DIAGNOSES  Final diagnoses:  Acute left ankle pain     ED Discharge Orders         Ordered    ibuprofen (ADVIL) 600 MG tablet  Every 8 hours PRN     10/19/19 0451    HYDROcodone-acetaminophen (NORCO) 5-325 MG tablet  Every 6 hours PRN     10/19/19 0451           Note:  This document was prepared using Dragon voice recognition software  and may include unintentional dictation errors.   Paulette Blanch, MD 10/19/19 [phone]

## 2019-10-27 DIAGNOSIS — F17210 Nicotine dependence, cigarettes, uncomplicated: Secondary | ICD-10-CM | POA: Insufficient documentation

## 2019-10-27 DIAGNOSIS — J45909 Unspecified asthma, uncomplicated: Secondary | ICD-10-CM | POA: Insufficient documentation

## 2019-10-27 DIAGNOSIS — R0789 Other chest pain: Secondary | ICD-10-CM | POA: Insufficient documentation

## 2019-10-28 ENCOUNTER — Emergency Department
Admission: EM | Admit: 2019-10-28 | Discharge: 2019-10-28 | Disposition: A | Discharge status: Home / Self Care | Payer: Self-pay | Attending: Emergency Medicine | Admitting: Emergency Medicine

## 2019-10-28 ENCOUNTER — Other Ambulatory Visit: Payer: Self-pay

## 2019-10-28 ENCOUNTER — Emergency Department: Payer: Self-pay

## 2019-10-28 DIAGNOSIS — R079 Chest pain, unspecified: Secondary | ICD-10-CM

## 2019-10-28 DIAGNOSIS — J45909 Unspecified asthma, uncomplicated: Secondary | ICD-10-CM

## 2019-10-28 LAB — CBC
HCT: 41.2 % (ref 36.0–46.0)
Hemoglobin: 13.3 g/dL (ref 12.0–15.0)
MCH: 29.4 pg (ref 26.0–34.0)
MCHC: 32.3 g/dL (ref 30.0–36.0)
MCV: 90.9 fL (ref 80.0–100.0)
Platelets: 419 10*3/uL — ABNORMAL HIGH (ref 150–400)
RBC: 4.53 MIL/uL (ref 3.87–5.11)
RDW: 13.3 % (ref 11.5–15.5)
WBC: 8.2 10*3/uL (ref 4.0–10.5)
nRBC: 0 % (ref 0.0–0.2)

## 2019-10-28 LAB — BASIC METABOLIC PANEL
Anion gap: 11 (ref 5–15)
BUN: 11 mg/dL (ref 6–20)
CO2: 24 mmol/L (ref 22–32)
Calcium: 9.1 mg/dL (ref 8.9–10.3)
Chloride: 104 mmol/L (ref 98–111)
Creatinine, Ser: 0.92 mg/dL (ref 0.44–1.00)
GFR calc Af Amer: >60 mL/min (ref >60)
GFR calc non Af Amer: >60 mL/min (ref >60)
Glucose, Bld: 150 mg/dL — ABNORMAL HIGH (ref 70–99)
Potassium: 3.3 mmol/L — ABNORMAL LOW (ref 3.5–5.1)
Sodium: 139 mmol/L (ref 135–145)

## 2019-10-28 LAB — TROPONIN I (HIGH SENSITIVITY)
Troponin I (High Sensitivity): 3 ng/L (ref <18)
Troponin I (High Sensitivity): 4 ng/L (ref <18)

## 2019-10-28 MED ORDER — PREDNISONE 50 MG PO TABS: ORAL_TABLET | ORAL | 0 refills | Status: DC

## 2019-10-28 MED ORDER — ALBUTEROL SULFATE HFA 108 (90 BASE) MCG/ACT IN AERS: 2.0000 | INHALATION_SPRAY | Freq: Four times a day (QID) | RESPIRATORY_TRACT | 1 refills | Status: DC | PRN

## 2019-10-28 NOTE — ED Provider Notes (Signed)
[facility] Emergency Department Provider Note       Time seen: ----------------------------------------- 8:27 AM on 10/28/2019 -----------------------------------------   I have reviewed the triage vital signs and the nursing notes.  HISTORY   Chief Complaint Chest Pain    HPI Nicole Gross is a 34 y.o. female with a history of asthma, PID who presents to the ED for chest tightness since yesterday.  Patient reports history of asthma, used inhalers without relief.  Discomfort was 8 out of 10, nothing made it better or worse.  Past Medical History:  Diagnosis Date  . Asthma   . Ectopic pregnancy    x 2  . Pelvic inflammatory disease (PID)     Patient Active Problem List   Diagnosis Date Noted  . Overweight (BMI 25.0-29.9) 04/03/2018  . PID (acute pelvic inflammatory disease) 07/03/2016  . Female pelvic inflammatory disease 09/17/2015  . Leukocytosis 09/17/2015  . History of gastrointestinal bleeding 09/17/2015  . Anemia 09/17/2015  . Pyuria 09/17/2015  . Tobacco abuse 09/17/2015  . Epiploic appendagitis 09/15/2015  . Asthma 07/26/2004    Past Surgical History:  Procedure Laterality Date  . ECTOPIC PREGNANCY SURGERY     h/o 1 ectopic on each side    Allergies Amoxicillin, Ciprofloxacin, Penicillins, and Codeine  Social History Social History   Tobacco Use  . Smoking status: Current Every Day Smoker    Packs/day: 0.50    Years: 10.00    Pack years: 5.00    Types: Cigarettes  . Smokeless tobacco: Never Used  Substance Use Topics  . Alcohol use: Yes    Comment: occ  . Drug use: Yes    Types: Marijuana    Review of Systems Constitutional: Negative for fever. Cardiovascular: Positive for chest tightness Respiratory: Negative for shortness of breath. Gastrointestinal: Negative for abdominal pain, vomiting and diarrhea. Musculoskeletal: Negative for back pain. Skin: Negative for rash. Neurological: Negative for headaches,  focal weakness or numbness.  All systems negative/normal/unremarkable except as stated in the HPI  ____________________________________________   PHYSICAL EXAM:  VITAL SIGNS: ED Triage Vitals  Enc Vitals Group     BP 10/27/19 2357 131/77     Pulse Rate 10/27/19 2357 81     Resp 10/27/19 2357 20     Temp 10/27/19 2357 98.8 F (37.1 C)     Temp Source 10/27/19 2357 Oral     SpO2 10/27/19 2357 98 %     Weight 10/28/19 0001 142 lb (64.4 kg)     Height 10/28/19 0001 4\' 11"  (1.499 m)     Head Circumference --      Peak Flow --      Pain Score 10/28/19 0001 8     Pain Loc --      Pain Edu? --      Excl. in [place]? --     Constitutional: Alert and oriented. Well appearing and in no distress. Eyes: Conjunctivae are normal. Normal extraocular movements. Cardiovascular: Normal rate, regular rhythm. No murmurs, rubs, or gallops. Respiratory: Normal respiratory effort without tachypnea nor retractions. Breath sounds are clear and equal bilaterally. No wheezes/rales/rhonchi. Gastrointestinal: Soft and nontender. Normal bowel sounds Musculoskeletal: Nontender with normal range of motion in extremities. No lower extremity tenderness nor edema. Neurologic:  Normal speech and language. No gross focal neurologic deficits are appreciated.  Skin:  Skin is warm, dry and intact. No rash noted. Psychiatric: Mood and affect are normal. Speech and behavior are normal.  ____________________________________________  EKG: Interpreted by me.  Sinus rhythm with rate of 70 bpm, normal PR interval, normal QRS, normal QT, nonspecific T wave abnormalities that are unchanged from prior  ____________________________________________  ED COURSE:  As part of my medical decision making, I reviewed the following data within the [place] History obtained from family if available, nursing notes, old chart and ekg, as well as notes from prior ED visits. Patient presented for chest tightness, we will  assess with labs and imaging as indicated at this time.   Procedures  [name] was evaluated in Emergency Department on 10/28/2019 for the symptoms described in the history of present illness. She was evaluated in the context of the global COVID-19 pandemic, which necessitated consideration that the patient might be at risk for infection with the SARS-CoV-2 virus that causes COVID-19. Institutional protocols and algorithms that pertain to the evaluation of patients at risk for COVID-19 are in a state of rapid change based on information released by regulatory bodies including the CDC and federal and state organizations. These policies and algorithms were followed during the patient's care in the ED.  ____________________________________________   LABS (pertinent positives/negatives)  Labs Reviewed  BASIC METABOLIC PANEL - Abnormal; Notable for the following components:      Result Value   Potassium 3.3 (*)    Glucose, Bld 150 (*)    All other components within normal limits  CBC - Abnormal; Notable for the following components:   Platelets 419 (*)    All other components within normal limits  TROPONIN I (HIGH SENSITIVITY)  TROPONIN I (HIGH SENSITIVITY)    RADIOLOGY Images were viewed by me Chest x-ray IMPRESSION: Normal study.  ____________________________________________   DIFFERENTIAL DIAGNOSIS   Musculoskeletal pain, GERD, anxiety, asthma, bronchitis, COVID-19  FINAL ASSESSMENT AND PLAN  Chest tightness, asthma   Plan: The patient had presented for chest tightness likely related to asthma exacerbations. Patient's labs were unremarkable including repeat troponin testing. Patient's imaging was also unremarkable.  She is cleared for outpatient follow-up.  I will write for steroids and albuterol.   Laurence Aly, MD    Note: This note was generated in part or whole with voice recognition software. Voice recognition is usually quite accurate but there are  transcription errors that can and very often do occur. I apologize for any typographical errors that were not detected and corrected.     Earleen Newport, MD 10/28/19 [phone]

## 2019-10-28 NOTE — ED Triage Notes (Signed)
Pt reports chest tightness since yesterday.  Hx asthma.  Used inhaler without relief  cig smoker.  No n/v/d  Pt alert  Speech clear.

## 2019-11-14 ENCOUNTER — Emergency Department
Admission: EM | Admit: 2019-11-14 | Discharge: 2019-11-14 | Disposition: A | Discharge status: Home / Self Care | Payer: Self-pay | Attending: Emergency Medicine | Admitting: Emergency Medicine

## 2019-11-14 ENCOUNTER — Other Ambulatory Visit: Payer: Self-pay

## 2019-11-14 ENCOUNTER — Encounter: Payer: Self-pay | Admitting: Emergency Medicine

## 2019-11-14 DIAGNOSIS — J45909 Unspecified asthma, uncomplicated: Secondary | ICD-10-CM | POA: Insufficient documentation

## 2019-11-14 DIAGNOSIS — F17210 Nicotine dependence, cigarettes, uncomplicated: Secondary | ICD-10-CM | POA: Insufficient documentation

## 2019-11-14 DIAGNOSIS — L02416 Cutaneous abscess of left lower limb: Secondary | ICD-10-CM | POA: Insufficient documentation

## 2019-11-14 DIAGNOSIS — L0291 Cutaneous abscess, unspecified: Secondary | ICD-10-CM

## 2019-11-14 DIAGNOSIS — Z79899 Other long term (current) drug therapy: Secondary | ICD-10-CM | POA: Insufficient documentation

## 2019-11-14 MED ORDER — TRAMADOL HCL 50 MG PO TABS
50.0000 mg | ORAL_TABLET | Freq: Once | ORAL | Status: AC
  Administered 2019-11-14: 50 mg via ORAL
  Filled 2019-11-14: qty 1

## 2019-11-14 MED ORDER — SULFAMETHOXAZOLE-TRIMETHOPRIM 800-160 MG PO TABS: 1.0000 | ORAL_TABLET | Freq: Two times a day (BID) | ORAL | 0 refills | Status: DC

## 2019-11-14 MED ORDER — LIDOCAINE HCL (PF) 1 % IJ SOLN
INTRAMUSCULAR | Status: AC
  Filled 2019-11-14: qty 5

## 2019-11-14 MED ORDER — LIDOCAINE HCL (PF) 1 % IJ SOLN
5.0000 mL | Freq: Once | INTRAMUSCULAR | Status: AC
  Administered 2019-11-14: 5 mL via INTRADERMAL

## 2019-11-14 NOTE — ED Provider Notes (Signed)
[facility] Emergency Department Provider Note  ____________________________________________  Time seen: Approximately 2:54 PM  I have reviewed the triage vital signs and the nursing notes.   HISTORY  Chief Complaint Insect Bite    HPI Nicole Gross is a 34 y.o. female that presents to that emergency department for evaluation of painful rash to posterior left thigh for 2 days.  Patient thinks she may have been bitten by an insect.  She has been picking at area trying to get it to drain.  No fevers.  No drainage.  Past Medical History:  Diagnosis Date  . Asthma   . Ectopic pregnancy    x 2  . Pelvic inflammatory disease (PID)     Patient Active Problem List   Diagnosis Date Noted  . Overweight (BMI 25.0-29.9) 04/03/2018  . PID (acute pelvic inflammatory disease) 07/03/2016  . Female pelvic inflammatory disease 09/17/2015  . Leukocytosis 09/17/2015  . History of gastrointestinal bleeding 09/17/2015  . Anemia 09/17/2015  . Pyuria 09/17/2015  . Tobacco abuse 09/17/2015  . Epiploic appendagitis 09/15/2015  . Asthma 07/26/2004    Past Surgical History:  Procedure Laterality Date  . ECTOPIC PREGNANCY SURGERY     h/o 1 ectopic on each side    Prior to Admission medications   Medication Sig Start Date End Date Taking? Authorizing Provider  albuterol (PROVENTIL HFA;VENTOLIN HFA) 108 (90 Base) MCG/ACT inhaler Inhale 2 puffs into the lungs every 6 (six) hours as needed for wheezing or shortness of breath. 12/20/17   Darel Hong, MD  albuterol (VENTOLIN HFA) 108 (90 Base) MCG/ACT inhaler Inhale 2 puffs into the lungs every 6 (six) hours as needed for wheezing or shortness of breath. 10/28/19   Earleen Newport, MD  predniSONE (DELTASONE) 50 MG tablet Take 1 tablet by mouth daily 10/28/19   Earleen Newport, MD  Spacer/Aero Chamber Mouthpiece MISC 1 Units by Does not apply route every 4 (four) hours as needed (wheezing). 12/20/17   Darel Hong,  MD    Allergies Amoxicillin, Ciprofloxacin, Penicillins, and Codeine  Family History  Problem Relation Age of Onset  . Hypertension Mother   . Diabetes Mother   . Asthma Mother   . Hypertension Father   . Diabetes Father   . Gout Father   . Asthma Sister   . Hypertension Maternal Grandmother   . Gout Paternal Grandmother   . Alzheimer's disease Paternal Grandmother   . Heart failure Paternal Grandmother     Social History Social History   Tobacco Use  . Smoking status: Current Every Day Smoker    Packs/day: 0.50    Years: 10.00    Pack years: 5.00    Types: Cigarettes  . Smokeless tobacco: Never Used  Substance Use Topics  . Alcohol use: Yes    Comment: occ  . Drug use: Yes    Types: Marijuana     Review of Systems  Constitutional: No fever/chills Respiratory: No SOB. Gastrointestinal: No nausea, no vomiting.  Musculoskeletal: Positive for thigh pain. Skin: Negative for abrasions, lacerations, ecchymosis. Positive for rash. Neurological: Negative for numbness or tingling   ____________________________________________   PHYSICAL EXAM:  VITAL SIGNS: ED Triage Vitals  Enc Vitals Group     BP 11/14/19 1414 109/61     Pulse Rate 11/14/19 1414 83     Resp 11/14/19 1414 18     Temp 11/14/19 1414 97.7 F (36.5 C)     Temp Source 11/14/19 1414 Oral     SpO2  11/14/19 1414 100 %     Weight 11/14/19 1417 141 lb (64 kg)     Height 11/14/19 1417 4\' 11"  (1.499 m)     Head Circumference --      Peak Flow --      Pain Score 11/14/19 1416 10     Pain Loc --      Pain Edu? --      Excl. in [place]? --      Constitutional: Alert and oriented. Well appearing and in no acute distress. Eyes: Conjunctivae are normal. PERRL. EOMI. Head: Atraumatic. ENT:      Ears:      Nose: No congestion/rhinnorhea.      Mouth/Throat: Mucous membranes are moist.  Neck: No stridor.  Cardiovascular: Normal rate, regular rhythm.  Good peripheral circulation. Respiratory: Normal  respiratory effort without tachypnea or retractions. Lungs CTAB. Good air entry to the bases with no decreased or absent breath sounds. Musculoskeletal: Full range of motion to all extremities. No gross deformities appreciated. Neurologic:  Normal speech and language. No gross focal neurologic deficits are appreciated.  Skin:  Skin is warm, dry and intact.  1/2 cm x 1/2 cm area of induration to left proximal hamstring with overlying erythema and central skin defect. Psychiatric: Mood and affect are normal. Speech and behavior are normal. Patient exhibits appropriate insight and judgement.   ____________________________________________   LABS (all labs ordered are listed, but only abnormal results are displayed)  Labs Reviewed - No data to display ____________________________________________  EKG   ____________________________________________  RADIOLOGY  No results found.  ____________________________________________    PROCEDURES  Procedure(s) performed:    Procedures  INCISION AND DRAINAGE Performed by: Laban Emperor Consent: Verbal consent obtained. Risks and benefits: risks, benefits and alternatives were discussed Type: abscess  Body area: hamstring  Anesthesia: local infiltration  Incision was made with a scalpel.  Local anesthetic: lidocaine 1 % without epinephrine  Anesthetic total: 2 ml  Complexity: complex Blunt dissection to break up loculations  Drainage: purulent  Drainage amount: mild  Packing material: 1/4 in iodoform gauze  Patient tolerance: Patient tolerated the procedure well with no immediate complications.    Medications - No data to display   ____________________________________________   INITIAL IMPRESSION / ASSESSMENT AND PLAN / ED COURSE  Pertinent labs & imaging results that were available during my care of the patient were reviewed by me and considered in my medical decision making (see chart for details).  Review of  the  [place] was performed in accordance of the [place] prior to dispensing any controlled drugs.     Patient's diagnosis is consistent with abscess.  Abscess was drained and packed in the emergency department.  Patient will be discharged home with prescriptions for Bactrim.  Patient is to follow up with primary care as directed. Patient is given ED precautions to return to the ED for any worsening or new symptoms.  Yecica Bove was evaluated in Emergency Department on 11/14/2019 for the symptoms described in the history of present illness. She was evaluated in the context of the global COVID-19 pandemic, which necessitated consideration that the patient might be at risk for infection with the SARS-CoV-2 virus that causes COVID-19. Institutional protocols and algorithms that pertain to the evaluation of patients at risk for COVID-19 are in a state of rapid change based on information released by regulatory bodies including the CDC and federal and state organizations. These policies and algorithms were followed during the patient's care in the ED.  ____________________________________________  FINAL CLINICAL IMPRESSION(S) / ED DIAGNOSES  Final diagnoses:  None      NEW MEDICATIONS STARTED DURING THIS VISIT:  ED Discharge Orders    None          This chart was dictated using voice recognition software/Dragon. Despite best efforts to proofread, errors can occur which can change the meaning. Any change was purely unintentional.    Laban Emperor, PA-C 11/14/19 1558    Blake Divine, MD 11/14/19 [phone]

## 2019-11-14 NOTE — ED Notes (Signed)
See triage note    States she thinks she may have been bitten by something  Red swollen area noted to inside of right thigh

## 2019-11-14 NOTE — ED Triage Notes (Signed)
Pt to ED with c/o of possible insect bite to back of right thigh.

## 2020-01-07 ENCOUNTER — Ambulatory Visit: Payer: Self-pay

## 2020-01-14 ENCOUNTER — Emergency Department
Admission: EM | Admit: 2020-01-14 | Discharge: 2020-01-15 | Disposition: A | Discharge status: Home / Self Care | Payer: Self-pay | Attending: Emergency Medicine | Admitting: Emergency Medicine

## 2020-01-14 ENCOUNTER — Emergency Department: Payer: Self-pay

## 2020-01-14 ENCOUNTER — Other Ambulatory Visit: Payer: Self-pay

## 2020-01-14 ENCOUNTER — Encounter: Payer: Self-pay | Admitting: Emergency Medicine

## 2020-01-14 DIAGNOSIS — J45909 Unspecified asthma, uncomplicated: Secondary | ICD-10-CM | POA: Insufficient documentation

## 2020-01-14 DIAGNOSIS — Z7951 Long term (current) use of inhaled steroids: Secondary | ICD-10-CM | POA: Insufficient documentation

## 2020-01-14 DIAGNOSIS — F1210 Cannabis abuse, uncomplicated: Secondary | ICD-10-CM | POA: Insufficient documentation

## 2020-01-14 DIAGNOSIS — F1410 Cocaine abuse, uncomplicated: Secondary | ICD-10-CM | POA: Insufficient documentation

## 2020-01-14 DIAGNOSIS — F17210 Nicotine dependence, cigarettes, uncomplicated: Secondary | ICD-10-CM | POA: Insufficient documentation

## 2020-01-14 DIAGNOSIS — R1030 Lower abdominal pain, unspecified: Secondary | ICD-10-CM | POA: Insufficient documentation

## 2020-01-14 LAB — CBC WITH DIFFERENTIAL/PLATELET
Abs Immature Granulocytes: 0.07 10*3/uL (ref 0.00–0.07)
Basophils Absolute: 0.1 10*3/uL (ref 0.0–0.1)
Basophils Relative: 1 %
Eosinophils Absolute: 0.9 10*3/uL — ABNORMAL HIGH (ref 0.0–0.5)
Eosinophils Relative: 8 %
HCT: 41.7 % (ref 36.0–46.0)
Hemoglobin: 13.6 g/dL (ref 12.0–15.0)
Immature Granulocytes: 1 %
Lymphocytes Relative: 34 %
Lymphs Abs: 4.2 10*3/uL — ABNORMAL HIGH (ref 0.7–4.0)
MCH: 29.1 pg (ref 26.0–34.0)
MCHC: 32.6 g/dL (ref 30.0–36.0)
MCV: 89.3 fL (ref 80.0–100.0)
Monocytes Absolute: 0.8 10*3/uL (ref 0.1–1.0)
Monocytes Relative: 7 %
Neutro Abs: 6.2 10*3/uL (ref 1.7–7.7)
Neutrophils Relative %: 49 %
Platelets: 443 10*3/uL — ABNORMAL HIGH (ref 150–400)
RBC: 4.67 MIL/uL (ref 3.87–5.11)
RDW: 13.7 % (ref 11.5–15.5)
WBC: 12.4 10*3/uL — ABNORMAL HIGH (ref 4.0–10.5)
nRBC: 0 % (ref 0.0–0.2)

## 2020-01-14 LAB — COMPREHENSIVE METABOLIC PANEL
ALT: 13 U/L (ref 0–44)
AST: 17 U/L (ref 15–41)
Albumin: 2.8 g/dL — ABNORMAL LOW (ref 3.5–5.0)
Alkaline Phosphatase: 63 U/L (ref 38–126)
Anion gap: 11 (ref 5–15)
BUN: 17 mg/dL (ref 6–20)
CO2: 25 mmol/L (ref 22–32)
Calcium: 8 mg/dL — ABNORMAL LOW (ref 8.9–10.3)
Chloride: 103 mmol/L (ref 98–111)
Creatinine, Ser: 1.37 mg/dL — ABNORMAL HIGH (ref 0.44–1.00)
GFR calc Af Amer: 58 mL/min — ABNORMAL LOW (ref >60)
GFR calc non Af Amer: 50 mL/min — ABNORMAL LOW (ref >60)
Glucose, Bld: 152 mg/dL — ABNORMAL HIGH (ref 70–99)
Potassium: 3.8 mmol/L (ref 3.5–5.1)
Sodium: 139 mmol/L (ref 135–145)
Total Bilirubin: 0.6 mg/dL (ref 0.3–1.2)
Total Protein: 5.6 g/dL — ABNORMAL LOW (ref 6.5–8.1)

## 2020-01-14 LAB — ACETAMINOPHEN LEVEL: Acetaminophen (Tylenol), Serum: <10 ug/mL — ABNORMAL LOW (ref 10–30)

## 2020-01-14 LAB — SALICYLATE LEVEL: Salicylate Lvl: <7 mg/dL — ABNORMAL LOW (ref 7.0–30.0)

## 2020-01-14 LAB — ETHANOL: Alcohol, Ethyl (B): <10 mg/dL (ref <10)

## 2020-01-14 MED ORDER — NALOXONE HCL 2 MG/2ML IJ SOSY
0.4000 mg | PREFILLED_SYRINGE | Freq: Once | INTRAMUSCULAR | Status: AC
  Administered 2020-01-14: 0.4 mg via INTRAVENOUS
  Filled 2020-01-14: qty 2

## 2020-01-14 MED ORDER — SODIUM CHLORIDE 0.9 % IV BOLUS
1000.0000 mL | Freq: Once | INTRAVENOUS | Status: AC
  Administered 2020-01-14: 1000 mL via INTRAVENOUS

## 2020-01-14 NOTE — ED Triage Notes (Signed)
Pt from home to ED via [facility] for lethargy and unresponsiveness. Per EMS, the fire dept was called due to pt's diaphoresis, lethargy, with pin point pupils and lack of verbal communication. Pt informed the fire dept that she took "2 pills" (and later said that it was Naproxen). Pt was given 1 mg of narcan and immediately came to. Pt st she took the [naproxen] for menstrual cramps. Pt st she is not sure if she is pregnant because she had menstrual bleeding/cramps with a previous miscarriage.   EMS VS:  BP-106/56 HR- 80 NSR CBG- 92 O2- 100RA

## 2020-01-14 NOTE — ED Notes (Signed)
Resumed care from [place] rn.  Dr brown in with pt.  Iv fluids infusing.  U/s in with pt.  Pt alert   Speech clear.  nsr on monitor.

## 2020-01-14 NOTE — ED Provider Notes (Signed)
[facility] Emergency Department Provider Note   ____________________________________________   First MD Initiated Contact with Patient 01/14/20 2213     (approximate)  I have reviewed the triage vital signs and the nursing notes.   HISTORY  Chief Complaint Drug Overdose and Fatigue   HPI Nicole Gross is a 34 y.o. female who comes from home by ambulance. She complains of severe lower abdominal pain like when she had her ectopic pregnancies. She said she had one in each ovary. EMS reports when they got there she was somnolent. She had pinpoint pupils. They reports she woke up with some Narcan. Patient says she didn't take any narcotics she only was using Naprosyn. Patient currently complaining of severe lower abdominal pain and is somewhat groggy. Pupils are midposition. Blood pressure is low in the 70s but her heart rate is also in the 70s. Patient reported she had her. Last month and again this month that she in fact she says she is on it now. It is on time and the right amount of bleeding.         Past Medical History:  Diagnosis Date  . Asthma   . Ectopic pregnancy    x 2  . Pelvic inflammatory disease (PID)     Patient Active Problem List   Diagnosis Date Noted  . Overweight (BMI 25.0-29.9) 04/03/2018  . PID (acute pelvic inflammatory disease) 07/03/2016  . Female pelvic inflammatory disease 09/17/2015  . Leukocytosis 09/17/2015  . History of gastrointestinal bleeding 09/17/2015  . Anemia 09/17/2015  . Pyuria 09/17/2015  . Tobacco abuse 09/17/2015  . Epiploic appendagitis 09/15/2015  . Asthma 07/26/2004    Past Surgical History:  Procedure Laterality Date  . ECTOPIC PREGNANCY SURGERY     h/o 1 ectopic on each side    Prior to Admission medications   Medication Sig Start Date End Date Taking? Authorizing Provider  albuterol (PROVENTIL HFA;VENTOLIN HFA) 108 (90 Base) MCG/ACT inhaler Inhale 2 puffs into the lungs every 6 (six) hours  as needed for wheezing or shortness of breath. 12/20/17   Darel Hong, MD  albuterol (VENTOLIN HFA) 108 (90 Base) MCG/ACT inhaler Inhale 2 puffs into the lungs every 6 (six) hours as needed for wheezing or shortness of breath. 10/28/19   Earleen Newport, MD  predniSONE (DELTASONE) 50 MG tablet Take 1 tablet by mouth daily 10/28/19   Earleen Newport, MD  Spacer/Aero Chamber Mouthpiece MISC 1 Units by Does not apply route every 4 (four) hours as needed (wheezing). 12/20/17   Darel Hong, MD  sulfamethoxazole-trimethoprim (BACTRIM DS) 800-160 MG tablet Take 1 tablet by mouth 2 (two) times daily. 11/14/19   Laban Emperor, PA-C    Allergies Amoxicillin, Ciprofloxacin, Penicillins, and Codeine  Family History  Problem Relation Age of Onset  . Hypertension Mother   . Diabetes Mother   . Asthma Mother   . Hypertension Father   . Diabetes Father   . Gout Father   . Asthma Sister   . Hypertension Maternal Grandmother   . Gout Paternal Grandmother   . Alzheimer's disease Paternal Grandmother   . Heart failure Paternal Grandmother     Social History Social History   Tobacco Use  . Smoking status: Current Every Day Smoker    Packs/day: 0.50    Years: 10.00    Pack years: 5.00    Types: Cigarettes  . Smokeless tobacco: Never Used  Substance Use Topics  . Alcohol use: Yes    Comment: occ  .  Drug use: Yes    Types: Marijuana    Review of Systems  Constitutional: No fever/chills Eyes: No visual changes. ENT: No sore throat. Cardiovascular: Denies chest pain. Respiratory: Denies shortness of breath. Gastrointestinal:  abdominal pain.  No nausea, no vomiting.  No diarrhea.  No constipation. Genitourinary: Negative for dysuria. Musculoskeletal: Negative for back pain. Skin: Negative for rash. Neurological: Negative for headaches, focal weakness   ____________________________________________   PHYSICAL EXAM:  VITAL SIGNS: ED Triage Vitals [01/14/20 2211]  Enc  Vitals Group     BP      Pulse      Resp      Temp      Temp src      SpO2 100 %     Weight      Height      Head Circumference      Peak Flow      Pain Score      Pain Loc      Pain Edu?      Excl. in [place]?     Constitutional: Sleepy but easily arousable Eyes: Conjunctivae are normal. PER. EOMI. Head: Atraumatic. Nose: No congestion/rhinnorhea. Mouth/Throat: Mucous membranes are moist.  Oropharynx non-erythematous. Neck: No stridor.  Cardiovascular: Normal rate, regular rhythm. Grossly normal heart sounds.  Good peripheral circulation. Respiratory: Normal respiratory effort.  No retractions. Lungs CTAB. Gastrointestinal: Soft tender to palpation lower abdomen no distention. No abdominal bruits.  Genitourinary: Deferred till patient's blood pressure goes up Musculoskeletal: No lower extremity tenderness nor edema.  . Neurologic:  Normal speech and language. No gross focal neurologic deficits are appreciated.  Skin:  Skin is warm, dry and intact. No rash noted.   ____________________________________________   LABS (all labs ordered are listed, but only abnormal results are displayed)  Labs Reviewed  COMPREHENSIVE METABOLIC PANEL - Abnormal; Notable for the following components:      Result Value   Glucose, Bld 152 (*)    Creatinine, Ser 1.37 (*)    Calcium 8.0 (*)    Total Protein 5.6 (*)    Albumin 2.8 (*)    GFR calc non Af Amer 50 (*)    GFR calc Af Amer 58 (*)    All other components within normal limits  CBC WITH DIFFERENTIAL/PLATELET - Abnormal; Notable for the following components:   WBC 12.4 (*)    Platelets 443 (*)    Lymphs Abs 4.2 (*)    Eosinophils Absolute 0.9 (*)    All other components within normal limits  ACETAMINOPHEN LEVEL - Abnormal; Notable for the following components:   Acetaminophen (Tylenol), Serum <10 (*)    All other components within normal limits  SALICYLATE LEVEL - Abnormal; Notable for the following components:   Salicylate Lvl  <5.3 (*)    All other components within normal limits  URINALYSIS, COMPLETE (UACMP) WITH MICROSCOPIC  PREGNANCY, URINE  URINE DRUG SCREEN, QUALITATIVE ([facility] ONLY)  ETHANOL  TYPE AND SCREEN  ABO/RH  TYPE AND SCREEN   ____________________________________________  EKG   ____________________________________________  RADIOLOGY  ED MD interpretation:    Official radiology report(s): DG Chest Portable 1 View  Result Date: 01/14/2020 CLINICAL DATA:  Chest pain EXAM: PORTABLE CHEST 1 VIEW COMPARISON:  10/28/2019 FINDINGS: The heart size and mediastinal contours are within normal limits. Both lungs are clear. The visualized skeletal structures are unremarkable. IMPRESSION: No active disease. Electronically Signed   By: Donavan Foil M.D.   On: 01/14/2020 22:54    ____________________________________________  PROCEDURES  Procedure(s) performed (including Critical Care): Critical care time 30 minutes.  This includes spending a good deal of time at the patient's bedside monitoring her progress with her blood pressure and pulse ox.  Speaking to Dr. Owens Shark and then working on the bedside ultrasound.  Procedures   ____________________________________________   INITIAL IMPRESSION / ASSESSMENT AND PLAN / ED COURSE  Patient given Narcan but that does not do anything much.  Patient's pulse ox was dropping but when it was placed on her ear pulse ox looks good.  Blood pressure was in the 70s comes up into the 90s with some fluid but the patient is still complaining of a lot of lower abdominal pain.  I have asked for a bedside ultrasound stat to evaluate her.  The ER ultrasound is hazy with some overlying gas and ribs shadows but does appear to show a black stripe between the liver and the kidney on the right side.  Patient is at risk for another ectopic.  Also she has had a history of PID in the past this could also be a problem.  She has a minimally elevated white count of 12,000.  Dr. Owens Shark is  coming on duty.  He will assume management of this patient while I work on a couple other severely ill patients.             ____________________________________________   FINAL CLINICAL IMPRESSION(S) / ED DIAGNOSES  Final diagnoses:  Lower abdominal pain     ED Discharge Orders    None       Note:  This document was prepared using Dragon voice recognition software and may include unintentional dictation errors.    Nena Polio, MD 01/14/20 2329

## 2020-01-15 ENCOUNTER — Emergency Department: Payer: Self-pay

## 2020-01-15 ENCOUNTER — Encounter: Payer: Self-pay | Admitting: Radiology

## 2020-01-15 LAB — URINALYSIS, COMPLETE (UACMP) WITH MICROSCOPIC
Bacteria, UA: NONE SEEN
Glucose, UA: NEGATIVE mg/dL
Ketones, ur: NEGATIVE mg/dL
Nitrite: NEGATIVE
Protein, ur: 100 mg/dL — AB
RBC / HPF: >50 RBC/hpf — ABNORMAL HIGH (ref 0–5)
Specific Gravity, Urine: 1.036 — ABNORMAL HIGH (ref 1.005–1.030)
WBC, UA: >50 WBC/hpf — ABNORMAL HIGH (ref 0–5)
pH: 5 (ref 5.0–8.0)

## 2020-01-15 LAB — ABO/RH: ABO/RH(D): O POS

## 2020-01-15 LAB — TYPE AND SCREEN
ABO/RH(D): O POS
Antibody Screen: NEGATIVE

## 2020-01-15 LAB — PREGNANCY, URINE: Preg Test, Ur: NEGATIVE

## 2020-01-15 LAB — URINE DRUG SCREEN, QUALITATIVE (ARMC ONLY)
Amphetamines, Ur Screen: NOT DETECTED
Barbiturates, Ur Screen: NOT DETECTED
Benzodiazepine, Ur Scrn: NOT DETECTED
Cannabinoid 50 Ng, Ur ~~LOC~~: NOT DETECTED
Cocaine Metabolite,Ur ~~LOC~~: POSITIVE — AB
MDMA (Ecstasy)Ur Screen: NOT DETECTED
Methadone Scn, Ur: NOT DETECTED
Opiate, Ur Screen: NOT DETECTED
Phencyclidine (PCP) Ur S: NOT DETECTED
Tricyclic, Ur Screen: NOT DETECTED

## 2020-01-15 MED ORDER — IOHEXOL 300 MG/ML  SOLN
100.0000 mL | Freq: Once | INTRAMUSCULAR | Status: AC | PRN
  Administered 2020-01-15: 100 mL via INTRAVENOUS

## 2020-01-15 MED ORDER — LACTATED RINGERS IV BOLUS: 1000.0000 mL | Freq: Once | INTRAVENOUS | Status: DC

## 2020-01-15 MED ORDER — SODIUM CHLORIDE 0.9 % IV BOLUS
1000.0000 mL | Freq: Once | INTRAVENOUS | Status: AC
  Administered 2020-01-15: 1000 mL via INTRAVENOUS

## 2020-01-15 NOTE — Discharge Instructions (Addendum)

## 2020-01-15 NOTE — ED Notes (Signed)
Pt awake.  nsr on monitor.   Iv in place.

## 2020-01-15 NOTE — ED Provider Notes (Signed)
Patient received in signout from Dr. Owens Shark.  Patient with extensive work-up.  Positive for cocaine positive urinalysis as well as concentrated urine on spec gravity.  Did have low blood pressures reportedly got better after Narcan.  She not required any additional doses of Narcan.  CT imaging of abdomen as well as pelvis were reassuring.  This not consistent with acute appendicitis.  Her repeat abdominal exam is soft and benign.  She does have a history of PID but states that this felt different.  Is not consistent with torsion.  I did recommend pelvic exam to evaluate for PID patient states that she prefer to follow-up with her primary she states that she is currently on her menstrual cycle and pain could be related to that.  As alternative offered the self swab testing or simply empiric treatment both of which the patient declined.  Patient was able to tolerate PO and was able to ambulate with a steady gait.    Nicole Lot, MD 01/15/20 [phone]

## 2020-01-15 NOTE — ED Notes (Signed)
Pt provided snacks and drink. Pt had empty cup at bedside and empty cracker wrappers. Pt ambulatory to toilet with steady gait to get dressed. E-signature pad broken. Signature sheet printed and signed on paper.

## 2020-01-15 NOTE — ED Notes (Signed)
Report off to rachel rn

## 2020-01-22 ENCOUNTER — Encounter: Payer: Self-pay | Admitting: Physician Assistant

## 2020-01-22 ENCOUNTER — Other Ambulatory Visit: Payer: Self-pay

## 2020-01-22 ENCOUNTER — Ambulatory Visit (LOCAL_COMMUNITY_HEALTH_CENTER): Payer: Self-pay | Admitting: Physician Assistant

## 2020-01-22 VITALS — BP 110/67 | Ht 59.0 in | Wt 143.0 lb

## 2020-01-22 DIAGNOSIS — Z113 Encounter for screening for infections with a predominantly sexual mode of transmission: Secondary | ICD-10-CM

## 2020-01-22 DIAGNOSIS — Z3009 Encounter for other general counseling and advice on contraception: Secondary | ICD-10-CM

## 2020-01-22 DIAGNOSIS — Z0000 Encounter for general adult medical examination without abnormal findings: Secondary | ICD-10-CM

## 2020-01-22 LAB — WET PREP FOR TRICH, YEAST, CLUE
Trichomonas Exam: NEGATIVE
Yeast Exam: NEGATIVE

## 2020-01-22 NOTE — Progress Notes (Signed)
Wet mount reviewed, no treatment indicated. Patient counseled she can use yeast cream for outside irritation and patient states she has some at home. She also states she thinks irritation due to switch in laundry detergent, and she is switching back to her previous detergent.Jenetta Downer, RN

## 2020-01-22 NOTE — Progress Notes (Addendum)
Patient here for PE, last PE was 03/2018. Last Pap 04/05/2018, NIL, HPV negative. States she has [facility] appointment next week, concerned she may have endometriosis, states she was diagnosed with uterine fibroids last year. Jenetta Downer, RN

## 2020-01-25 ENCOUNTER — Encounter: Payer: Self-pay | Admitting: Physician Assistant

## 2020-01-25 NOTE — Progress Notes (Signed)
Family Planning Visit- Repeat Yearly Visit  Subjective:  Nicole Gross is a 34 y.o. G5P0050  being seen today for an well woman visit and to discuss family planning options.    She is currently using none for pregnancy prevention. Patient reports she does  want a pregnancy in the next year. Patient  has Epiploic appendagitis; Female pelvic inflammatory disease; Leukocytosis; History of gastrointestinal bleeding; Anemia; Pyuria; Tobacco abuse; PID (acute pelvic inflammatory disease); Asthma; and Overweight (BMI 25.0-29.9) on their problem list.  Chief Complaint  Patient presents with   Contraception    PE    Patient reports that she has an appointment at [facility] next week to discuss fibroids and evaluation for endometriosis.  States that she was having dizziness last week and per evaluation at the ER she was dehydrated and was not drinking enough water and too many sodas.  Reports that she has had some shortness of breath due to asthma.  States she changed her detergent and is having some vaginal itching and discharge which she thinks is due to that but would like to have a wet mount and GC/Chlamydia testing to make sure.  Per chart review, CBE and pap are due at RP in 2022.  Patient denies any other concerns today.  Denies changes to her personal and family history.   See flowsheet for other program required questions.   Body mass index is 28.88 kg/m. - Patient is eligible for diabetes screening based on BMI and age >56?  not applicable DJ4H ordered? not applicable  Patient reports 1 of partners in last year. Desires STI screening?  Yes   Has patient been screened once for HCV in the past?  No  No results found for: HCVAB  Does the patient have current of drug use, have a partner with drug use, and/or has been incarcerated since last result? No  If yes-- Screen for HCV through [facility]   Does the patient meet criteria for HBV testing? No  Criteria:  -Household, sexual or needle  sharing contact with HBV -History of drug use -HIV positive -Those with known Hep C   Health Maintenance Due  Topic Date Due   Hepatitis C Screening  Never done   COVID-19 Vaccine (1) Never done   TETANUS/TDAP  Never done    Review of Systems  All other systems reviewed and are negative.   The following portions of the patient's history were reviewed and updated as appropriate: allergies, current medications, past family history, past medical history, past social history, past surgical history and problem list. Problem list updated.  Objective:   Vitals:   01/22/20 1405  BP: 110/67  Weight: 143 lb (64.9 kg)  Height: 4\' 11"  (1.499 m)    Physical Exam Vitals and nursing note reviewed.  Constitutional:      General: She is not in acute distress.    Appearance: Normal appearance.  HENT:     Head: Normocephalic and atraumatic.  Eyes:     Conjunctiva/sclera: Conjunctivae normal.  Neck:     Thyroid: No thyroid mass, thyromegaly or thyroid tenderness.  Cardiovascular:     Rate and Rhythm: Normal rate and regular rhythm.  Pulmonary:     Effort: Pulmonary effort is normal.     Breath sounds: Normal breath sounds.  Abdominal:     Palpations: Abdomen is soft. There is no mass.     Tenderness: There is no abdominal tenderness. There is no guarding or rebound.  Musculoskeletal:  Cervical back: Neck supple.  Lymphadenopathy:     Cervical: No cervical adenopathy.  Skin:    General: Skin is warm and dry.     Findings: No bruising, erythema, lesion or rash.  Neurological:     Mental Status: She is alert and oriented to person, place, and time.  Psychiatric:        Mood and Affect: Mood normal.        Behavior: Behavior normal.        Thought Content: Thought content normal.        Judgment: Judgment normal.    Patient opts to self-collect vaginal samples for testing today.    Assessment and Plan:  Nicole Gross is a 34 y.o. female [place] presenting to the  [facility] Department for an yearly well woman exam/family planning visit  Contraception counseling: Reviewed all forms of birth control options in the tiered based approach. available including abstinence; over the counter/barrier methods; hormonal contraceptive medication including pill, patch, ring, injection,contraceptive implant, ECP; hormonal and nonhormonal IUDs; permanent sterilization options including vasectomy and the various tubal sterilization modalities. Risks, benefits, and typical effectiveness rates were reviewed.  Questions were answered.  Written information was also given to the patient to review.  Patient desires to continue with using no BCM, this was prescribed for patient. She will follow up in  1 year and prn for surveillance.  She was told to call with any further questions, or with any concerns about this method of contraception.  Emphasized use of condoms 100% of the time for STI prevention.  Patient was not a candidate for ECP today.    1. Encounter for counseling regarding contraception Reviewed with patient BCMs and that she can RTC at any time to discuss starting a hormonal method. Enc condoms with all sex for STD and pregnancy protection until after discussion with Gyn. c 2. Screening for STD (sexually transmitted disease) Await test results.  Counseled that RN will call if needs to RTC for treatment once results are back.  - WET PREP FOR [place], YEAST, CLUE - Chlamydia/Gonorrhea [facility] - HIV [place] [facility] - Syphilis Serology, [place]  3. Well woman exam (no gynecological exam) Reviewed with patient healthy habits to maintain normal BMI. Enc MVI 1 po daily for folic acid since desires pregnancy. Enc to keep appointment with [facility] as scheduled. Enc to establish with/follow up with PCP for primary care concerns and illness.     Return in about 1 year (around 01/21/2021) for annual RP and pap.  No future appointments.  Jerene Dilling,  PA

## 2020-03-08 ENCOUNTER — Emergency Department (HOSPITAL_COMMUNITY)
Admission: EM | Admit: 2020-03-08 | Discharge: 2020-03-08 | Disposition: A | Discharge status: Home / Self Care | Payer: Self-pay | Attending: Emergency Medicine | Admitting: Emergency Medicine

## 2020-03-08 ENCOUNTER — Other Ambulatory Visit: Payer: Self-pay

## 2020-03-08 ENCOUNTER — Emergency Department (HOSPITAL_COMMUNITY): Payer: Self-pay

## 2020-03-08 DIAGNOSIS — F17210 Nicotine dependence, cigarettes, uncomplicated: Secondary | ICD-10-CM | POA: Insufficient documentation

## 2020-03-08 DIAGNOSIS — D259 Leiomyoma of uterus, unspecified: Secondary | ICD-10-CM | POA: Insufficient documentation

## 2020-03-08 DIAGNOSIS — N939 Abnormal uterine and vaginal bleeding, unspecified: Secondary | ICD-10-CM | POA: Insufficient documentation

## 2020-03-08 DIAGNOSIS — D219 Benign neoplasm of connective and other soft tissue, unspecified: Secondary | ICD-10-CM

## 2020-03-08 DIAGNOSIS — R102 Pelvic and perineal pain: Secondary | ICD-10-CM | POA: Insufficient documentation

## 2020-03-08 DIAGNOSIS — J45909 Unspecified asthma, uncomplicated: Secondary | ICD-10-CM | POA: Insufficient documentation

## 2020-03-08 LAB — URINALYSIS, ROUTINE W REFLEX MICROSCOPIC
Bilirubin Urine: NEGATIVE
Glucose, UA: NEGATIVE mg/dL
Ketones, ur: NEGATIVE mg/dL
Nitrite: NEGATIVE
Protein, ur: NEGATIVE mg/dL
Specific Gravity, Urine: 1.006 (ref 1.005–1.030)
pH: 5 (ref 5.0–8.0)

## 2020-03-08 LAB — CBC
HCT: 36.3 % (ref 36.0–46.0)
Hemoglobin: 11.1 g/dL — ABNORMAL LOW (ref 12.0–15.0)
MCH: 28 pg (ref 26.0–34.0)
MCHC: 30.6 g/dL (ref 30.0–36.0)
MCV: 91.7 fL (ref 80.0–100.0)
Platelets: 374 10*3/uL (ref 150–400)
RBC: 3.96 MIL/uL (ref 3.87–5.11)
RDW: 13.6 % (ref 11.5–15.5)
WBC: 8.7 10*3/uL (ref 4.0–10.5)
nRBC: 0 % (ref 0.0–0.2)

## 2020-03-08 LAB — BASIC METABOLIC PANEL
Anion gap: 9 (ref 5–15)
BUN: 8 mg/dL (ref 6–20)
CO2: 25 mmol/L (ref 22–32)
Calcium: 8.6 mg/dL — ABNORMAL LOW (ref 8.9–10.3)
Chloride: 110 mmol/L (ref 98–111)
Creatinine, Ser: 1.03 mg/dL — ABNORMAL HIGH (ref 0.44–1.00)
GFR calc Af Amer: >60 mL/min (ref >60)
GFR calc non Af Amer: >60 mL/min (ref >60)
Glucose, Bld: 96 mg/dL (ref 70–99)
Potassium: 3.9 mmol/L (ref 3.5–5.1)
Sodium: 144 mmol/L (ref 135–145)

## 2020-03-08 LAB — I-STAT BETA HCG BLOOD, ED (MC, WL, AP ONLY): I-stat hCG, quantitative: <5 m[IU]/mL (ref <5)

## 2020-03-08 LAB — WET PREP, GENITAL
Clue Cells Wet Prep HPF POC: NONE SEEN
Sperm: NONE SEEN
Trich, Wet Prep: NONE SEEN
Yeast Wet Prep HPF POC: NONE SEEN

## 2020-03-08 LAB — HIV ANTIBODY (ROUTINE TESTING W REFLEX): HIV Screen 4th Generation wRfx: NONREACTIVE

## 2020-03-08 MED ORDER — FENTANYL CITRATE (PF) 100 MCG/2ML IJ SOLN
50.0000 ug | Freq: Once | INTRAMUSCULAR | Status: AC
  Administered 2020-03-08: 50 ug via INTRAVENOUS
  Filled 2020-03-08: qty 2

## 2020-03-08 MED ORDER — FERROUS SULFATE 325 (65 FE) MG PO TABS: 325.0000 mg | ORAL_TABLET | Freq: Every day | ORAL | 0 refills | Status: DC

## 2020-03-08 MED ORDER — DOXYCYCLINE HYCLATE 100 MG PO CAPS: 100.0000 mg | ORAL_CAPSULE | Freq: Two times a day (BID) | ORAL | 0 refills | Status: DC

## 2020-03-08 MED ORDER — SODIUM CHLORIDE 0.9 % IV BOLUS
500.0000 mL | Freq: Once | INTRAVENOUS | Status: AC
  Administered 2020-03-08: 500 mL via INTRAVENOUS

## 2020-03-08 MED ORDER — METRONIDAZOLE 500 MG PO TABS: 500.0000 mg | ORAL_TABLET | Freq: Two times a day (BID) | ORAL | 0 refills | Status: DC

## 2020-03-08 NOTE — ED Notes (Signed)
Verbalized understanding of DC instructions, Rx, follow up care with OBGYN

## 2020-03-08 NOTE — ED Provider Notes (Signed)
[place] EMERGENCY DEPARTMENT Provider Note   CSN: [id number] Arrival date & time: 03/08/20  1324     History Chief Complaint  Patient presents with  . Vaginal Bleeding    Nicole Gross is a 34 y.o. female with a history of pelvic inflammatory disease, ectopic pregnancy, and anemia who presents to the emergency department with complaints of vaginal bleeding and pelvic pain for the past 18 days.  Patient states that she began with spotting type bleeding on 15 August, this continued for about a week until her regular menstrual cycle started, her menstrual cycle ended a few days ago and now she has continued to have spotting.  With her vaginal bleeding she has had pelvic pain which is intermittent, sharp/stabbing, more so to the right side of her pelvis.  It is worse when she is up and moving, no alleviating factors.  She denies fever, chills, nausea, vomiting, dysuria, or vaginal discharge.  She is sexually active in a monogamous relationship and is not concerned for STDs.  HPI     Past Medical History:  Diagnosis Date  . Asthma   . Ectopic pregnancy    x 2  . Pelvic inflammatory disease (PID)     Patient Active Problem List   Diagnosis Date Noted  . Overweight (BMI 25.0-29.9) 04/03/2018  . PID (acute pelvic inflammatory disease) 07/03/2016  . Female pelvic inflammatory disease 09/17/2015  . Leukocytosis 09/17/2015  . History of gastrointestinal bleeding 09/17/2015  . Anemia 09/17/2015  . Pyuria 09/17/2015  . Tobacco abuse 09/17/2015  . Epiploic appendagitis 09/15/2015  . Asthma 07/26/2004    Past Surgical History:  Procedure Laterality Date  . ECTOPIC PREGNANCY SURGERY     h/o 1 ectopic on each side     OB History    Gravida  5   Para  0   Term      Preterm      AB  5   Living        SAB  3   TAB      Ectopic  2   Multiple      Live Births              Family History  Problem Relation Age of Onset  . Hypertension  Mother   . Diabetes Mother   . Asthma Mother   . Hypertension Father   . Diabetes Father   . Gout Father   . Asthma Sister   . Hypertension Maternal Grandmother   . Gout Paternal Grandmother   . Alzheimer's disease Paternal Grandmother   . Heart failure Paternal Grandmother     Social History   Tobacco Use  . Smoking status: Current Every Day Smoker    Packs/day: 0.50    Years: 10.00    Pack years: 5.00    Types: Cigarettes  . Smokeless tobacco: Never Used  Substance Use Topics  . Alcohol use: Yes    Comment: occ  . Drug use: Yes    Types: Marijuana    Home Medications Prior to Admission medications   Medication Sig Start Date End Date Taking? Authorizing Provider  albuterol (VENTOLIN HFA) 108 (90 Base) MCG/ACT inhaler Inhale 2 puffs into the lungs every 6 (six) hours as needed for wheezing or shortness of breath. 10/28/19  Yes Earleen Newport, MD  IBUPROFEN PO Take 1 tablet by mouth once.   Yes [provider]  Multiple Vitamin (MULTIVITAMIN WITH MINERALS) TABS tablet Take 1 tablet by  mouth daily.   Yes [provider]  Spacer/Aero Chamber Mouthpiece MISC 1 Units by Does not apply route every 4 (four) hours as needed (wheezing). 12/20/17   Darel Hong, MD    Allergies    Amoxicillin, Ciprofloxacin, Penicillins, and Codeine  Review of Systems   Review of Systems  Constitutional: Negative for chills and fever.  Respiratory: Negative for shortness of breath.   Cardiovascular: Negative for chest pain.  Gastrointestinal: Negative for constipation, diarrhea, nausea and vomiting.  Genitourinary: Positive for pelvic pain and vaginal bleeding. Negative for dysuria and vaginal discharge.  Neurological: Negative for syncope.  All other systems reviewed and are negative.   Physical Exam Updated Vital Signs BP 113/76   Pulse 66   Temp 98.7 F (37.1 C) (Oral)   Resp 16   LMP 03/01/2020   SpO2 99%   Physical Exam Vitals and nursing note  reviewed.  Constitutional:      Appearance: She is well-developed. She is not toxic-appearing.     Comments: Appears uncomfortable  HENT:     Head: Normocephalic and atraumatic.  Eyes:     General:        Right eye: No discharge.        Left eye: No discharge.     Conjunctiva/sclera: Conjunctivae normal.  Cardiovascular:     Rate and Rhythm: Normal rate and regular rhythm.  Pulmonary:     Effort: Pulmonary effort is normal. No respiratory distress.     Breath sounds: Normal breath sounds. No wheezing, rhonchi or rales.  Abdominal:     General: There is no distension.     Palpations: Abdomen is soft.     Tenderness: There is abdominal tenderness (suprapubic). There is no guarding or rebound.  Genitourinary:    Comments: Patient has blood/discharge present in the vaginal vault.  Patient has right adnexal tenderness.  Otherwise nontender.  No external lesions noted.  Chaperone present throughout assessment. Musculoskeletal:     Cervical back: Neck supple.  Skin:    General: Skin is warm and dry.     Findings: No rash.  Neurological:     Mental Status: She is alert.     Comments: Clear speech.   Psychiatric:        Behavior: Behavior normal.     ED Results / Procedures / Treatments   Labs (all labs ordered are listed, but only abnormal results are displayed) Labs Reviewed  WET PREP, GENITAL - Abnormal; Notable for the following components:      Result Value   WBC, Wet Prep HPF POC MANY (*)    All other components within normal limits  CBC - Abnormal; Notable for the following components:   Hemoglobin 11.1 (*)    All other components within normal limits  BASIC METABOLIC PANEL - Abnormal; Notable for the following components:   Creatinine, Ser 1.03 (*)    Calcium 8.6 (*)    All other components within normal limits  URINALYSIS, ROUTINE W REFLEX MICROSCOPIC - Abnormal; Notable for the following components:   APPearance HAZY (*)    Hgb urine dipstick LARGE (*)     Leukocytes,Ua LARGE (*)    Bacteria, UA RARE (*)    All other components within normal limits  HIV ANTIBODY (ROUTINE TESTING W REFLEX)  RPR  I-STAT BETA HCG BLOOD, ED ([facility], [facility], [facility] ONLY)  GC/CHLAMYDIA PROBE AMP ([place]) NOT AT [facility]    EKG None  Radiology US Transvaginal Non-OB  Result Date: 03/08/2020 CLINICAL  DATA:  Pelvic pain for 3 weeks, history of prior ectopic, PID and uterine fibroids EXAM: TRANSABDOMINAL AND TRANSVAGINAL ULTRASOUND OF PELVIS DOPPLER ULTRASOUND OF OVARIES TECHNIQUE: Both transabdominal and transvaginal ultrasound examinations of the pelvis were performed. Transabdominal technique was performed for global imaging of the pelvis including uterus, ovaries, adnexal regions, and pelvic cul-de-sac. It was necessary to proceed with endovaginal exam following the transabdominal exam to visualize the uterus, endometrium, and ovaries. Color and duplex Doppler ultrasound was utilized to evaluate blood flow to the ovaries. COMPARISON:  Pelvic ultrasound 01/14/2020 FINDINGS: Uterus Measurements: 8.8 x 5.6 x 5.7 cm = volume: 147.5 mL. Multiple uterine fibroids, largest is a posterior fibroid at the uterine body measuring approximately 4.0 x 3.6 x 4.1 cm. There is a right posterior fundal fibroid measuring 2.2 x 1.8 x 1.9 cm. Additional left anterior fundal fibroid measures 2.0 x 1.9 x 2.1 cm. These are not significantly enlarged from comparison with differences in measurement likely related to technique. Endometrium Thickness: 6.4 mm, within normal limits for a reproductive age female. No focal abnormality visualized. Right ovary Measurements: 2.1 x 1.4 x 1.5 cm = volume: 2.4 mL. Normal appearance of the ovary. Normal follicles. No concerning adnexal lesions. Left ovary Measurements: 3.4 x 1.7 x 3.1 cm = volume: 9.7 mL. Normal appearance of the ovary. Normal follicles. No concerning adnexal lesions. Pulsed Doppler evaluation of both ovaries demonstrates normal low-resistance arterial and  venous waveforms. Other findings Small volume free fluid in the pelvis, nonspecific but possibly physiologic in a reproductive age female. IMPRESSION: 1. Multiple uterine fibroids, largest of which measures up to 4.1 cm in size. These are not significantly enlarged from comparison. 2. Small volume free fluid in the pelvis, nonspecific but often physiologic in a reproductive age female. 3. No other acute pelvic abnormality. No evidence of adnexal torsion. Electronically Signed   By: Lovena Le M.D.   On: 03/08/2020 17:52   US Pelvis Complete  Result Date: 03/08/2020 CLINICAL DATA:  Pelvic pain for 3 weeks, history of prior ectopic, PID and uterine fibroids EXAM: TRANSABDOMINAL AND TRANSVAGINAL ULTRASOUND OF PELVIS DOPPLER ULTRASOUND OF OVARIES TECHNIQUE: Both transabdominal and transvaginal ultrasound examinations of the pelvis were performed. Transabdominal technique was performed for global imaging of the pelvis including uterus, ovaries, adnexal regions, and pelvic cul-de-sac. It was necessary to proceed with endovaginal exam following the transabdominal exam to visualize the uterus, endometrium, and ovaries. Color and duplex Doppler ultrasound was utilized to evaluate blood flow to the ovaries. COMPARISON:  Pelvic ultrasound 01/14/2020 FINDINGS: Uterus Measurements: 8.8 x 5.6 x 5.7 cm = volume: 147.5 mL. Multiple uterine fibroids, largest is a posterior fibroid at the uterine body measuring approximately 4.0 x 3.6 x 4.1 cm. There is a right posterior fundal fibroid measuring 2.2 x 1.8 x 1.9 cm. Additional left anterior fundal fibroid measures 2.0 x 1.9 x 2.1 cm. These are not significantly enlarged from comparison with differences in measurement likely related to technique. Endometrium Thickness: 6.4 mm, within normal limits for a reproductive age female. No focal abnormality visualized. Right ovary Measurements: 2.1 x 1.4 x 1.5 cm = volume: 2.4 mL. Normal appearance of the ovary. Normal follicles. No  concerning adnexal lesions. Left ovary Measurements: 3.4 x 1.7 x 3.1 cm = volume: 9.7 mL. Normal appearance of the ovary. Normal follicles. No concerning adnexal lesions. Pulsed Doppler evaluation of both ovaries demonstrates normal low-resistance arterial and venous waveforms. Other findings Small volume free fluid in the pelvis, nonspecific but possibly physiologic in a reproductive  age female. IMPRESSION: 1. Multiple uterine fibroids, largest of which measures up to 4.1 cm in size. These are not significantly enlarged from comparison. 2. Small volume free fluid in the pelvis, nonspecific but often physiologic in a reproductive age female. 3. No other acute pelvic abnormality. No evidence of adnexal torsion. Electronically Signed   By: Lovena Le M.D.   On: 03/08/2020 17:52   [place] Art/Ven Flow Abd Pelv Doppler  Result Date: 03/08/2020 CLINICAL DATA:  Pelvic pain for 3 weeks, history of prior ectopic, PID and uterine fibroids EXAM: TRANSABDOMINAL AND TRANSVAGINAL ULTRASOUND OF PELVIS DOPPLER ULTRASOUND OF OVARIES TECHNIQUE: Both transabdominal and transvaginal ultrasound examinations of the pelvis were performed. Transabdominal technique was performed for global imaging of the pelvis including uterus, ovaries, adnexal regions, and pelvic cul-de-sac. It was necessary to proceed with endovaginal exam following the transabdominal exam to visualize the uterus, endometrium, and ovaries. Color and duplex Doppler ultrasound was utilized to evaluate blood flow to the ovaries. COMPARISON:  Pelvic ultrasound 01/14/2020 FINDINGS: Uterus Measurements: 8.8 x 5.6 x 5.7 cm = volume: 147.5 mL. Multiple uterine fibroids, largest is a posterior fibroid at the uterine body measuring approximately 4.0 x 3.6 x 4.1 cm. There is a right posterior fundal fibroid measuring 2.2 x 1.8 x 1.9 cm. Additional left anterior fundal fibroid measures 2.0 x 1.9 x 2.1 cm. These are not significantly enlarged from comparison with differences in  measurement likely related to technique. Endometrium Thickness: 6.4 mm, within normal limits for a reproductive age female. No focal abnormality visualized. Right ovary Measurements: 2.1 x 1.4 x 1.5 cm = volume: 2.4 mL. Normal appearance of the ovary. Normal follicles. No concerning adnexal lesions. Left ovary Measurements: 3.4 x 1.7 x 3.1 cm = volume: 9.7 mL. Normal appearance of the ovary. Normal follicles. No concerning adnexal lesions. Pulsed Doppler evaluation of both ovaries demonstrates normal low-resistance arterial and venous waveforms. Other findings Small volume free fluid in the pelvis, nonspecific but possibly physiologic in a reproductive age female. IMPRESSION: 1. Multiple uterine fibroids, largest of which measures up to 4.1 cm in size. These are not significantly enlarged from comparison. 2. Small volume free fluid in the pelvis, nonspecific but often physiologic in a reproductive age female. 3. No other acute pelvic abnormality. No evidence of adnexal torsion. Electronically Signed   By: Lovena Le M.D.   On: 03/08/2020 17:52    Procedures Procedures (including critical care time)  Medications Ordered in ED Medications  fentaNYL (SUBLIMAZE) injection 50 mcg (has no administration in time range)  sodium chloride 0.9 % bolus 500 mL (has no administration in time range)    ED Course  I have reviewed the triage vital signs and the nursing notes.  Pertinent labs & imaging results that were available during my care of the patient were reviewed by me and considered in my medical decision making (see chart for details).    MDM Rules/Calculators/A&P                          Patient presents to the ED with complaints of vaginal bleeding and pelvic pain for the past 15 days.  Patient is nontoxic, she intermittently appears uncomfortable, her vitals are without significant abnormality.  On exam she has suprapubic tenderness externally as well as right adnexal tenderness with bimanual  exam.  There is some bloody discharge present in the vaginal vault.  Additional history obtained:  Additional history obtained from nursing note & chart review.  pevious records obtained and reviewed.   Lab Tests:  I reviewed and interpreted labs, which included:  CBC: Mild anemia, no leukocytosis. BMP: Mild elevation in creatinine, similar to prior. Prep: Many WBCs, no BV, trichomoniasis, or yeast. Urinalysis: Large leukocytes present, will send for culture. Pregnancy test: Negative  Imaging Studies ordered:  I ordered imaging studies which included pelvic ultrasound with Doppler, I independently visualized and interpreted imaging which showed  1. Multiple uterine fibroids, largest of which measures up to 4.1 cm in size. These are not significantly enlarged from comparison. 2. Small volume free fluid in the pelvis, nonspecific but often physiologic in a reproductive age female. 3. No other acute pelvic abnormality.  She is feeling much better status post analgesics, she feels ready to go home. Pregnancy test negative therefore doubt ectopic pregnancy or IUP. Pelvic ultrasound without ovarian torsion or cyst.  She does have multiple uterine fibroids which could be contributing to her symptoms. She has a history of PID, she states this has been somewhat similar, she is in a monogamous relationship, however given her history and her H&P do feel that it is prudent to cover for PID.  Given her anaphylactic reaction to penicillins will not get ceftriaxone in the ED, will defer to gonorrhea results-spoke with pharmacist who is in agreement that was not specifically ideal regimen otherwise, will send home with doxycycline and Flagyl.  We discussed OB/GYN follow-up. I discussed results, treatment plan, need for follow-up, and return precautions with the patient. Provided opportunity for questions, patient confirmed understanding and is in agreement with plan.   Portions of this note were generated with  [employer]. Dictation errors may occur despite best attempts at proofreading.  Final Clinical Impression(s) / ED Diagnoses Final diagnoses:  Vaginal bleeding  Fibroids    Rx / DC Orders ED Discharge Orders         Ordered    doxycycline (VIBRAMYCIN) 100 MG capsule  2 times daily        03/08/20 2027    metroNIDAZOLE (FLAGYL) 500 MG tablet  2 times daily        03/08/20 2027    ferrous sulfate 325 (65 FE) MG tablet  Daily        03/08/20 2027           Leafy Kindle 03/08/20 2032    Noemi Chapel, MD 03/13/20 [phone]

## 2020-03-08 NOTE — ED Triage Notes (Signed)
Pt reports vaginal bleeding x 3 weeks with pain that feels like period cramps. Hx fibroids.

## 2020-03-08 NOTE — Discharge Instructions (Addendum)
You were seen in the emergency department today for pelvic pain and vaginal bleeding.  Your ultrasound did show signs of fibroids.  Your labs show that your kidney function was mildly elevated.  We are treating you for PID with doxycycline and Flagyl, these are both antibiotics, please take these as prescribed.  Do not drink alcohol when taking Flagyl as it can be very dangerous.  We are also sending you home with an iron supplement to take given your bleeding and mild anemia on your blood work.  Your kidney function was mildly abnormal, it was rechecked by your primary care provider.  We have prescribed you new medication(s) today. Discuss the medications prescribed today with your pharmacist as they can have adverse effects and interactions with your other medicines including over the counter and prescribed medications. Seek medical evaluation if you start to experience new or abnormal symptoms after taking one of these medicines, seek care immediately if you start to experience difficulty breathing, feeling of your throat closing, facial swelling, or rash as these could be indications of a more serious allergic reaction  Please follow-up with OBGYN within 3 days.  Return to the ER for new or worsening symptoms including but not limited to increased pain, fevers, increased bleeding, dizziness, lightheadedness, passing out, or any other concerns.

## 2020-03-09 LAB — URINE CULTURE: Culture: <10000 — AB

## 2020-03-09 LAB — GC/CHLAMYDIA PROBE AMP (~~LOC~~) NOT AT ARMC
Chlamydia: NEGATIVE
Comment: NEGATIVE
Comment: NORMAL
Neisseria Gonorrhea: POSITIVE — AB

## 2020-03-09 LAB — RPR: RPR Ser Ql: NONREACTIVE

## 2020-03-22 ENCOUNTER — Telehealth: Payer: Self-pay | Admitting: Family Medicine

## 2020-03-22 NOTE — Telephone Encounter (Signed)
Call to patient to discuss positive Gonorrhea test result from 03/08/20 ER visit.  Patient informed of + GCresult.  Patient schedueld for treatment appointment.  Patient verbalizes understanding at this time and has no further questions.  Junious Dresser, RN

## 2020-03-23 ENCOUNTER — Ambulatory Visit: Payer: Self-pay | Admitting: Physician Assistant

## 2020-03-23 ENCOUNTER — Other Ambulatory Visit: Payer: Self-pay

## 2020-03-23 DIAGNOSIS — Z113 Encounter for screening for infections with a predominantly sexual mode of transmission: Secondary | ICD-10-CM

## 2020-03-23 DIAGNOSIS — A5402 Gonococcal vulvovaginitis, unspecified: Secondary | ICD-10-CM

## 2020-03-23 MED ORDER — GENTAMICIN SULFATE 40 MG/ML IJ SOLN
240.0000 mg | Freq: Once | INTRAMUSCULAR | Status: AC
  Administered 2020-03-23: 240 mg via INTRAMUSCULAR

## 2020-03-23 NOTE — Progress Notes (Signed)
Pt treated per provider order. Provider orders completed.

## 2020-03-24 ENCOUNTER — Encounter: Payer: Self-pay | Admitting: Physician Assistant

## 2020-03-24 NOTE — Progress Notes (Signed)
S:  Patient into clinic for treatment.  Reports that she was seen at the ER and told that she needed treatment for PID and was given Metronidazole and Doxycycline.  Reports that she was called and told that her GC test was positive and to come here for treatment.  States that the Metronidazole is making her very nauseous and is not sure she can tolerate continuing to take it.  Allergy to PCN and Cipro. O:  WDWN female in NAD, A&O x 3, normal work of breathing. A/P:  1.  Patient needs treatment for GC. 2.  Reviewed note from ER visit from 03/08/2020 and patient needs treatment for GC with Gentamicin due to allergy to PCN and Cipro. 3.  Counseled patient re:  Importance of completing medicines as prescribed but that she could finish the Doxycycline and then take the Metronidazole to make it easier to tolerate than taking both antibiotics at the same time. 4.  Patient to get Gentamicin 240 mg IM today. 5.  No sex for 14 days and until after partner completes treatment. 6.  Rec condoms with all sex.  7.  RTC prn.

## 2020-04-12 ENCOUNTER — Ambulatory Visit: Payer: Self-pay

## 2020-04-14 ENCOUNTER — Encounter: Payer: Self-pay | Admitting: Family Medicine

## 2020-04-14 ENCOUNTER — Ambulatory Visit: Payer: Self-pay | Admitting: Family Medicine

## 2020-04-14 ENCOUNTER — Ambulatory Visit: Payer: Self-pay

## 2020-04-14 ENCOUNTER — Other Ambulatory Visit: Payer: Self-pay

## 2020-04-14 DIAGNOSIS — N730 Acute parametritis and pelvic cellulitis: Secondary | ICD-10-CM

## 2020-04-14 LAB — WET PREP FOR TRICH, YEAST, CLUE
Trichomonas Exam: NEGATIVE
Yeast Exam: NEGATIVE

## 2020-04-14 NOTE — Progress Notes (Signed)
S.  Client is here for PID f/u.  States that she and partner have been treated for PID, both "received shots and pills'.  States that they waited 1 week before resuming unprotected sex.  Client states that she understood that if she was feeling better that she didn't have to take the 14 days of the Metronidazole- states she took about 1 week.  She states that her abdominal pain is much better.  She states that does have mild pain with sex and she thinks that she may have a yeast infection.  Denies fever, N/V, rash, lesions, flank pain. O:  Pelvic exam-  Vagina- small amount of thin, white discharge, pH 4.5 Cervix- pink, no exudate Uterus- no CMT Adnexa- bilateral mild adnexal tenderness L>R, no masses A: PID f/u P:  Co to compete treatment as directed.   Client states that she is due for her period in 3-4 days and this may be the cause of the mild adnexal tenderness.  Co that is the pain continues after menses that she needs to return to clinic/ED for further evaluation, treatment or possible [place] to r/o ovarian abscess/ cysts.  GC/Chlamydia not done today d/t client is  ~ 3 weeks since she began treatment. Encouraged pelvic rest/no sex x 1 week while she completes her treatment.  Client states she understands the counseling.

## 2020-04-14 NOTE — Progress Notes (Signed)
Per wet prep no treatment needed.  No other labs pending.  Patient to call if any questions or concerns.  Junious Dresser, RN

## 2020-07-17 IMAGING — CT CT HIP*R* W/O CM
2 of 3 series · 17 of 46 positions shown, 19 images · non-contrast
Comparison: Plain film from 07/14/2019

CLINICAL DATA: Right hip pain for several days

EXAM:
CT OF THE RIGHT HIP WITHOUT CONTRAST
TECHNIQUE: Multidetector CT imaging of the right hip was performed according to
the standard protocol. Multiplanar CT image reconstructions were
also generated.

[Series 3: axial st · axial · 0.44mm/px · z∈[+69,+209]mm · 14 of 82 slices shown, 16 images]
[im 6/82  soft-tissue]
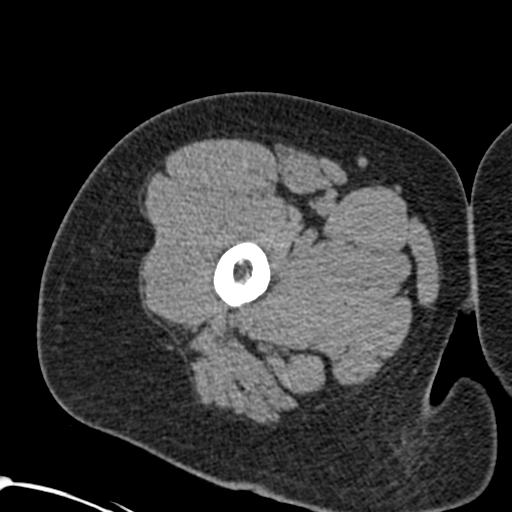
[im 6/82  bone]
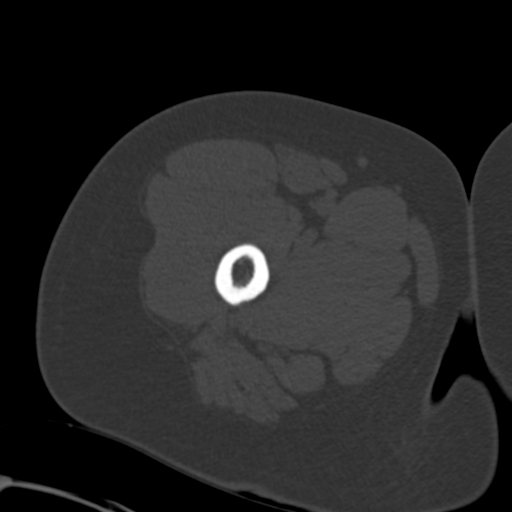
[im 11/82  soft-tissue]
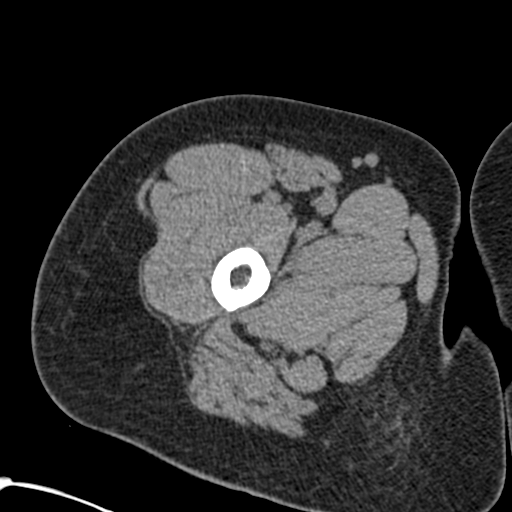
[im 16/82  soft-tissue]
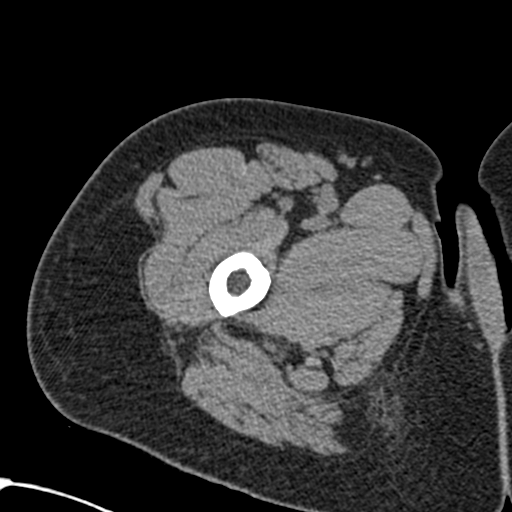
[im 21/82  soft-tissue]
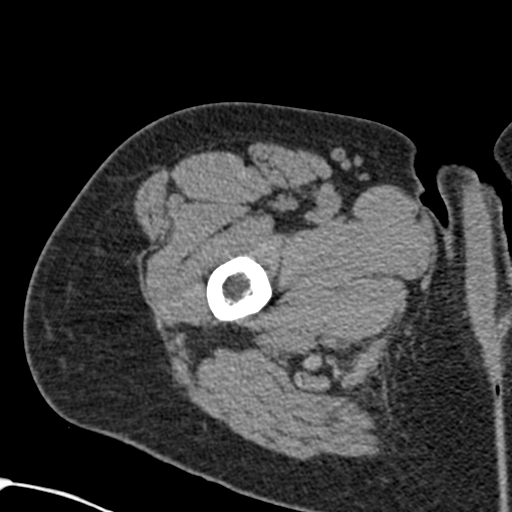
[im 27/82  soft-tissue]
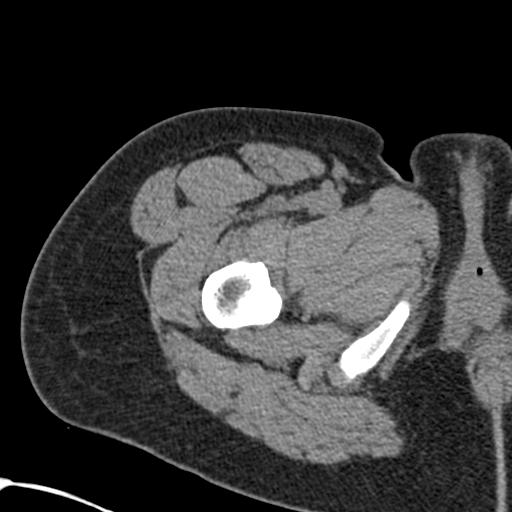
[im 32/82  soft-tissue]
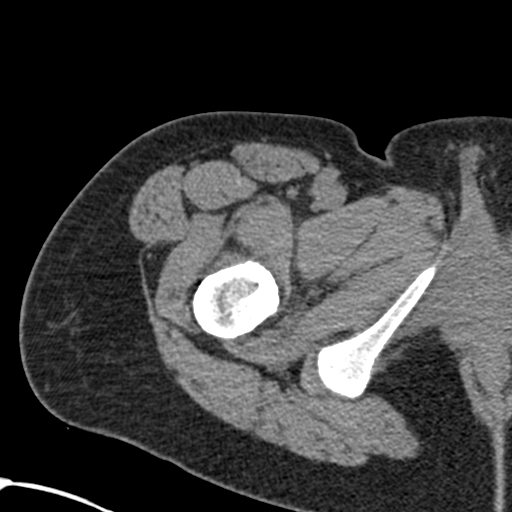
[im 37/82  soft-tissue]
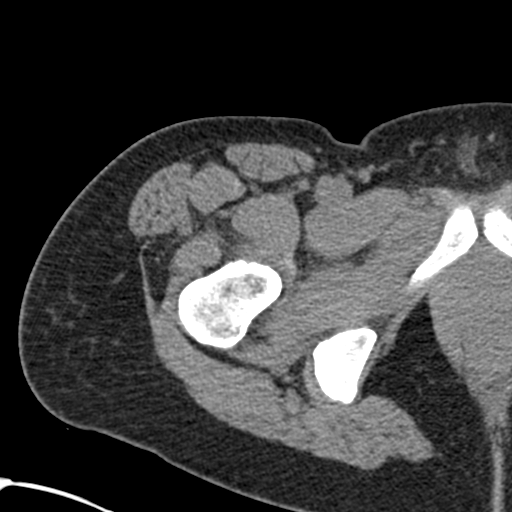
[im 45/82  soft-tissue]
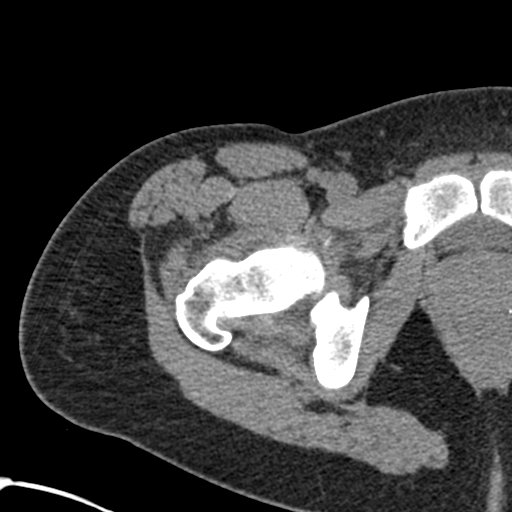
[im 50/82  soft-tissue]
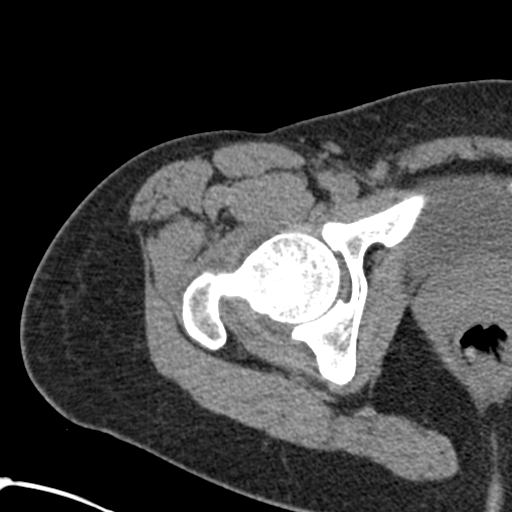
[im 50/82  bone]
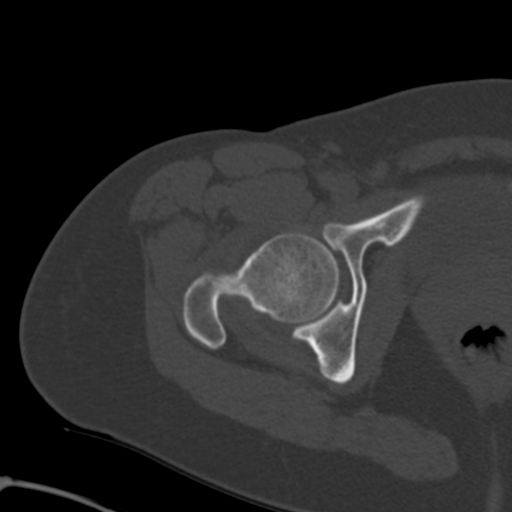
[im 55/82  soft-tissue]
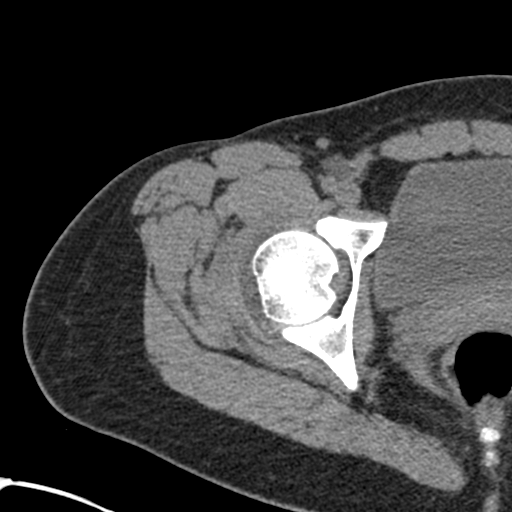
[im 61/82  soft-tissue]
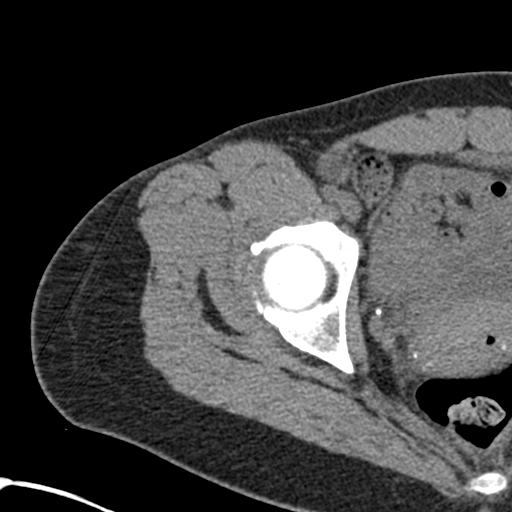
[im 66/82  soft-tissue]
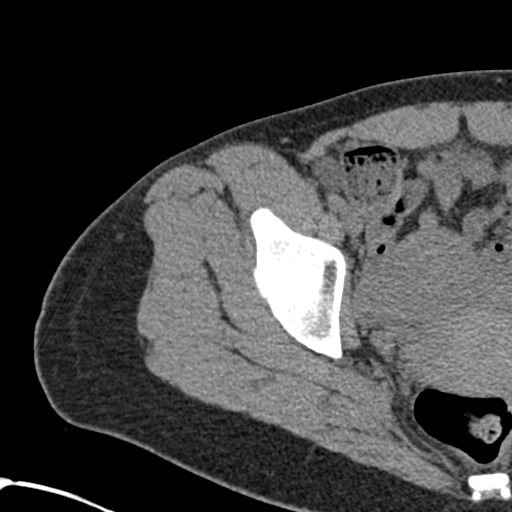
[im 71/82  soft-tissue]
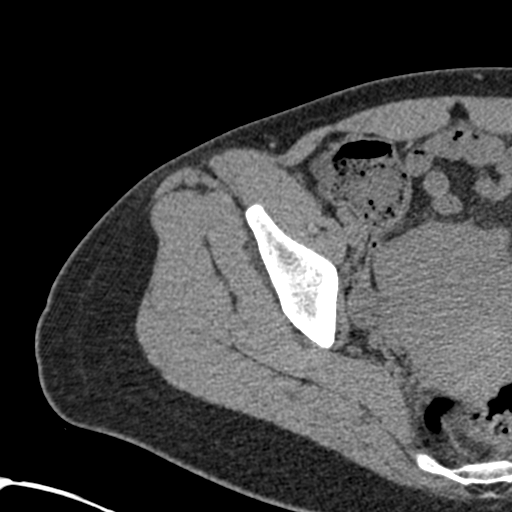
[im 76/82  soft-tissue]
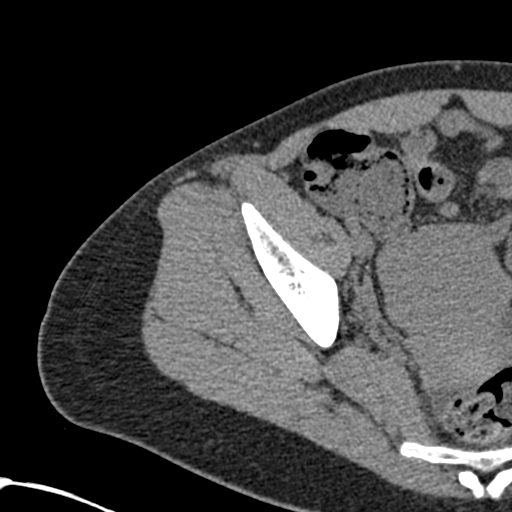

[Series 6: coronal st · coronal · 0.35mm/px · 3 of 92 slices shown]
[im 31/92  soft-tissue]
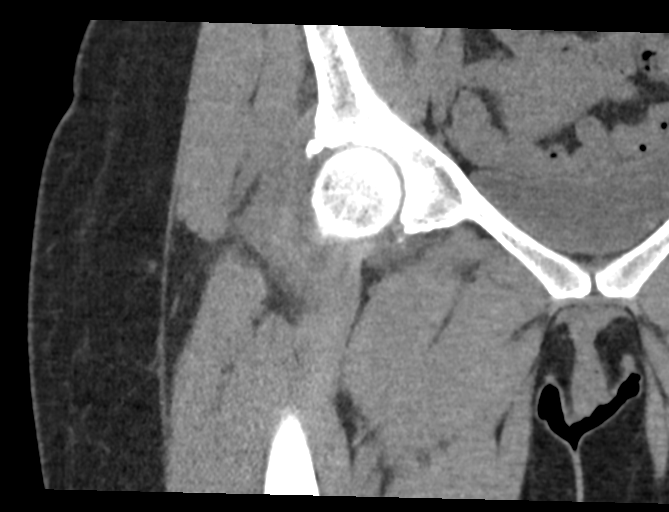
[im 41/92  soft-tissue]
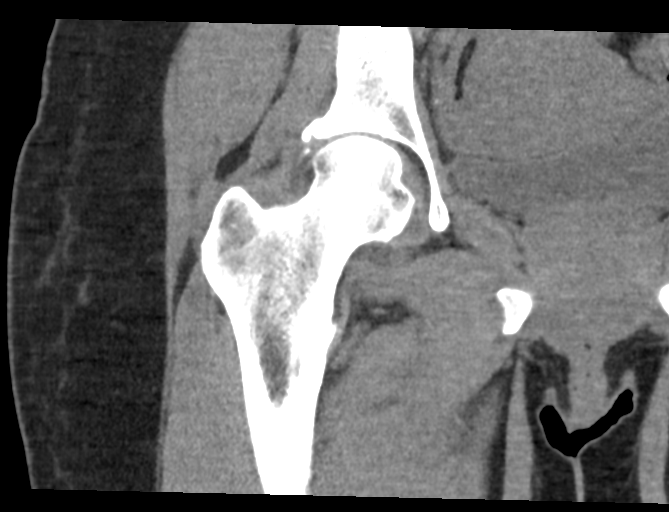
[im 51/92  soft-tissue]
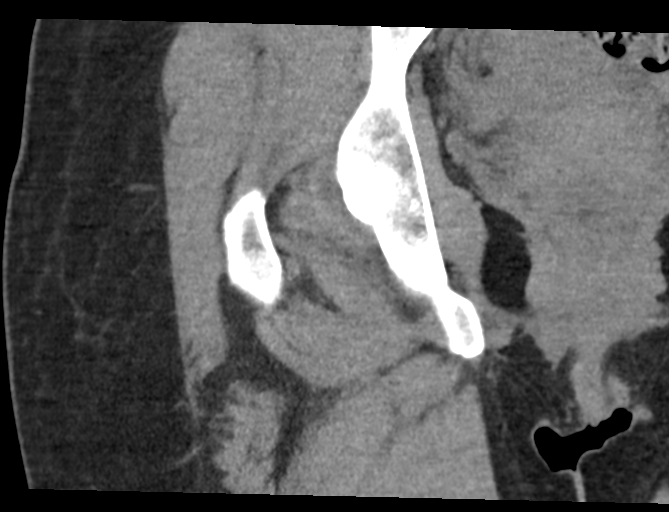

[17 of 46 positions shown; findings below may reference images not displayed]

FINDINGS: Bones/Joint/Cartilage

Some chronic degenerative changes of the acetabulum on the right are
seen. Visualized bony structures otherwise are within normal limits.

Ligaments

Suboptimally assessed by CT.

Muscles and Tendons

Surrounding musculature appears within normal limits.

Soft tissues

There is evidence of a mild left hip joint effusion identified both
anteriorly and posteriorly. No other focal soft tissue abnormality
is noted.
IMPRESSION: No evidence of acute fracture noted.

Right-sided hip joint effusion.  Arthrocentesis may be helpful.

## 2020-12-07 ENCOUNTER — Emergency Department
Admission: EM | Admit: 2020-12-07 | Discharge: 2020-12-07 | Disposition: A | Discharge status: Home / Self Care | Payer: Medicaid Other | Attending: Emergency Medicine | Admitting: Emergency Medicine

## 2020-12-07 ENCOUNTER — Other Ambulatory Visit: Payer: Self-pay

## 2020-12-07 ENCOUNTER — Emergency Department: Payer: Medicaid Other

## 2020-12-07 DIAGNOSIS — O99331 Smoking (tobacco) complicating pregnancy, first trimester: Secondary | ICD-10-CM | POA: Diagnosis not present

## 2020-12-07 DIAGNOSIS — J45909 Unspecified asthma, uncomplicated: Secondary | ICD-10-CM | POA: Diagnosis not present

## 2020-12-07 DIAGNOSIS — Z3A01 Less than 8 weeks gestation of pregnancy: Secondary | ICD-10-CM | POA: Diagnosis not present

## 2020-12-07 DIAGNOSIS — F17210 Nicotine dependence, cigarettes, uncomplicated: Secondary | ICD-10-CM | POA: Diagnosis not present

## 2020-12-07 DIAGNOSIS — O26891 Other specified pregnancy related conditions, first trimester: Secondary | ICD-10-CM | POA: Diagnosis not present

## 2020-12-07 DIAGNOSIS — R1030 Lower abdominal pain, unspecified: Secondary | ICD-10-CM | POA: Diagnosis not present

## 2020-12-07 DIAGNOSIS — R1031 Right lower quadrant pain: Secondary | ICD-10-CM

## 2020-12-07 DIAGNOSIS — Z8759 Personal history of other complications of pregnancy, childbirth and the puerperium: Secondary | ICD-10-CM

## 2020-12-07 LAB — COMPREHENSIVE METABOLIC PANEL
ALT: 11 U/L (ref 0–44)
AST: 19 U/L (ref 15–41)
Albumin: 3.1 g/dL — ABNORMAL LOW (ref 3.5–5.0)
Alkaline Phosphatase: 64 U/L (ref 38–126)
Anion gap: 4 — ABNORMAL LOW (ref 5–15)
BUN: 12 mg/dL (ref 6–20)
CO2: 26 mmol/L (ref 22–32)
Calcium: 8.6 mg/dL — ABNORMAL LOW (ref 8.9–10.3)
Chloride: 108 mmol/L (ref 98–111)
Creatinine, Ser: 1 mg/dL (ref 0.44–1.00)
GFR, Estimated: >60 mL/min (ref >60)
Glucose, Bld: 59 mg/dL — ABNORMAL LOW (ref 70–99)
Potassium: 4.1 mmol/L (ref 3.5–5.1)
Sodium: 138 mmol/L (ref 135–145)
Total Bilirubin: 0.3 mg/dL (ref 0.3–1.2)
Total Protein: 6 g/dL — ABNORMAL LOW (ref 6.5–8.1)

## 2020-12-07 LAB — CBC
HCT: 37.4 % (ref 36.0–46.0)
Hemoglobin: 12.4 g/dL (ref 12.0–15.0)
MCH: 29.3 pg (ref 26.0–34.0)
MCHC: 33.2 g/dL (ref 30.0–36.0)
MCV: 88.4 fL (ref 80.0–100.0)
Platelets: 329 10*3/uL (ref 150–400)
RBC: 4.23 MIL/uL (ref 3.87–5.11)
RDW: 16.4 % — ABNORMAL HIGH (ref 11.5–15.5)
WBC: 9.4 10*3/uL (ref 4.0–10.5)
nRBC: 0 % (ref 0.0–0.2)

## 2020-12-07 LAB — POC URINE PREG, ED: Preg Test, Ur: POSITIVE — AB

## 2020-12-07 LAB — HCG, QUANTITATIVE, PREGNANCY: hCG, Beta Chain, Quant, S: 2303 m[IU]/mL — ABNORMAL HIGH (ref <5)

## 2020-12-07 NOTE — ED Triage Notes (Signed)
Pt c/o RLQ pain since Sunday , states she is concerned she just had a positive pregnancy test yesterday and had 2 tubal pregnancy s in the past. Denies any vaginal bleeding.

## 2020-12-07 NOTE — ED Notes (Signed)
Patient rightlower side couple days. No pain with palpation. Says it comes and goes and is dull pain.  Says history of ectopic pregnancy on both sides with surgery, so she is worried about that.  No diarrhea/vomiting.  No fever.

## 2020-12-07 NOTE — Discharge Instructions (Addendum)
As we discussed you need to have a repeat hormone level done on Thursday morning at Dr. Rebekah Gross office

## 2020-12-07 NOTE — ED Provider Notes (Signed)
[facility] Emergency Department Provider Note   ____________________________________________    I have reviewed the triage vital signs and the nursing notes.   HISTORY  Chief Complaint Abdominal Pain     HPI Nicole Gross is a 35 y.o. female who presents with complaints of lower abdominal discomfort.  Patient reports she had a positive pregnancy test, she estimates she could be about 4 to [redacted] weeks pregnant.  She has a history of 2 ectopic pregnancies.  She describes cramping lower abdominal pain.  No vaginal bleeding.  No fevers chills nausea or vomiting  Past Medical History:  Diagnosis Date  . Asthma   . Ectopic pregnancy    x 2  . Pelvic inflammatory disease (PID)     Patient Active Problem List   Diagnosis Date Noted  . Overweight (BMI 25.0-29.9) 04/03/2018  . PID (acute pelvic inflammatory disease) 07/03/2016  . Female pelvic inflammatory disease 09/17/2015  . Leukocytosis 09/17/2015  . History of gastrointestinal bleeding 09/17/2015  . Anemia 09/17/2015  . Pyuria 09/17/2015  . Tobacco abuse 09/17/2015  . Epiploic appendagitis 09/15/2015  . Asthma 07/26/2004    Past Surgical History:  Procedure Laterality Date  . ECTOPIC PREGNANCY SURGERY     h/o 1 ectopic on each side    Prior to Admission medications   Medication Sig Start Date End Date Taking? Authorizing Provider  albuterol (VENTOLIN HFA) 108 (90 Base) MCG/ACT inhaler Inhale 2 puffs into the lungs every 6 (six) hours as needed for wheezing or shortness of breath. 10/28/19   Earleen Newport, MD  doxycycline (VIBRAMYCIN) 100 MG capsule Take 1 capsule (100 mg total) by mouth 2 (two) times daily. 03/08/20   Petrucelli, Samantha R, PA-C  ferrous sulfate 325 (65 FE) MG tablet Take 1 tablet (325 mg total) by mouth daily. 03/08/20   Petrucelli, Samantha R, PA-C  IBUPROFEN PO Take 1 tablet by mouth once.    [provider]  metroNIDAZOLE (FLAGYL) 500 MG tablet Take 1  tablet (500 mg total) by mouth 2 (two) times daily. 03/08/20   Petrucelli, Samantha R, PA-C  Multiple Vitamin (MULTIVITAMIN WITH MINERALS) TABS tablet Take 1 tablet by mouth daily.    [provider]  Spacer/Aero Chamber Mouthpiece MISC 1 Units by Does not apply route every 4 (four) hours as needed (wheezing). 12/20/17   Darel Hong, MD     Allergies Amoxicillin, Ciprofloxacin, Penicillins, and Codeine  Family History  Problem Relation Age of Onset  . Hypertension Mother   . Diabetes Mother   . Asthma Mother   . Hypertension Father   . Diabetes Father   . Gout Father   . Asthma Sister   . Hypertension Maternal Grandmother   . Gout Paternal Grandmother   . Alzheimer's disease Paternal Grandmother   . Heart failure Paternal Grandmother     Social History Social History   Tobacco Use  . Smoking status: Current Every Day Smoker    Packs/day: 0.50    Years: 10.00    Pack years: 5.00    Types: Cigarettes  . Smokeless tobacco: Never Used  Substance Use Topics  . Alcohol use: Yes    Comment: occ  . Drug use: Yes    Types: Marijuana    Review of Systems  Constitutional: No fever/chills Eyes: No visual changes.  ENT: No sore throat. Cardiovascular: Denies chest pain. Respiratory: Denies shortness of breath. Gastrointestinal: As above Genitourinary: Negative for dysuria.  No vaginal bleeding Musculoskeletal: Negative for back  pain. Skin: Negative for rash. Neurological: Negative for headaches or weakness   ____________________________________________   PHYSICAL EXAM:  VITAL SIGNS: ED Triage Vitals  Enc Vitals Group     BP 12/07/20 1329 107/67     Pulse Rate 12/07/20 1329 73     Resp 12/07/20 1329 15     Temp 12/07/20 1329 98.3 F (36.8 C)     Temp Source 12/07/20 1329 Oral     SpO2 12/07/20 1329 100 %     Weight 12/07/20 1331 69.4 kg (153 lb)     Height 12/07/20 1331 1.499 m (4\' 11" )     Head Circumference --      Peak Flow --      Pain Score  12/07/20 1330 3     Pain Loc --      Pain Edu? --      Excl. in [place]? --     Constitutional: Alert and oriented.  Pleasant and interactive  Nose: No congestion/rhinnorhea. Mouth/Throat: Mucous membranes are moist.    Cardiovascular: Normal rate, regular rhythm.   Good peripheral circulation. Respiratory: Normal respiratory effort.  No retractions.  Gastrointestinal: Soft and nontender. No distention.  No CVA tenderness.  Musculoskeletal: .  Warm and well perfused Neurologic:  Normal speech and language. No gross focal neurologic deficits are appreciated.  Skin:  Skin is warm, dry and intact. No rash noted. Psychiatric: Mood and affect are normal. Speech and behavior are normal.  ____________________________________________   LABS (all labs ordered are listed, but only abnormal results are displayed)  Labs Reviewed  COMPREHENSIVE METABOLIC PANEL - Abnormal; Notable for the following components:      Result Value   Glucose, Bld 59 (*)    Calcium 8.6 (*)    Total Protein 6.0 (*)    Albumin 3.1 (*)    Anion gap 4 (*)    All other components within normal limits  CBC - Abnormal; Notable for the following components:   RDW 16.4 (*)    All other components within normal limits  HCG, QUANTITATIVE, PREGNANCY - Abnormal; Notable for the following components:   hCG, Beta Chain, Quant, S 2,303 (*)    All other components within normal limits  POC URINE PREG, ED - Abnormal; Notable for the following components:   Preg Test, Ur POSITIVE (*)    All other components within normal limits  URINALYSIS, COMPLETE (UACMP) WITH MICROSCOPIC   ____________________________________________  EKG  None ____________________________________________  RADIOLOGY  Ultrasound without IUP ____________________________________________   PROCEDURES  Procedure(s) performed: No  Procedures   Critical Care performed: No ____________________________________________   INITIAL IMPRESSION /  ASSESSMENT AND PLAN / ED COURSE  Pertinent labs & imaging results that were available during my care of the patient were reviewed by me and considered in my medical decision making (see chart for details).  Patient presents with lower abdominal discomfort in the setting of history of 2 ectopic pregnancies.  Beta here is 2000, ultrasound unable to identify IUP.  Discussed with Dr. Amalia Hailey of OB/GYN, he will see the patient in 2 days for repeat beta  Patient is well-appearing and in no acute distress, she is comfortable with this plan, she knows she can return to the emergency department as needed    ____________________________________________   FINAL CLINICAL IMPRESSION(S) / ED DIAGNOSES  Final diagnoses:  Less than [redacted] weeks gestation of pregnancy        Note:  This document was prepared using Dragon voice recognition software and may  include unintentional dictation errors.   Lavonia Drafts, MD 12/07/20 2136

## 2020-12-16 ENCOUNTER — Encounter: Payer: Self-pay | Admitting: Surgical

## 2020-12-16 ENCOUNTER — Ambulatory Visit (INDEPENDENT_AMBULATORY_CARE_PROVIDER_SITE_OTHER): Payer: Self-pay | Admitting: Obstetrics and Gynecology

## 2020-12-16 ENCOUNTER — Other Ambulatory Visit
Admission: RE | Admit: 2020-12-16 | Discharge: 2020-12-16 | Disposition: A | Discharge status: Home / Self Care | Payer: Medicaid Other | Source: Home / Self Care | Attending: Obstetrics and Gynecology | Admitting: Obstetrics and Gynecology

## 2020-12-16 ENCOUNTER — Encounter: Payer: Self-pay | Admitting: Obstetrics and Gynecology

## 2020-12-16 ENCOUNTER — Other Ambulatory Visit: Payer: Self-pay | Admitting: Obstetrics and Gynecology

## 2020-12-16 ENCOUNTER — Other Ambulatory Visit: Payer: Self-pay

## 2020-12-16 ENCOUNTER — Ambulatory Visit
Admission: RE | Admit: 2020-12-16 | Discharge: 2020-12-16 | Disposition: A | Discharge status: Home / Self Care | Payer: Medicaid Other | Source: Ambulatory Visit | Attending: Obstetrics and Gynecology | Admitting: Obstetrics and Gynecology

## 2020-12-16 VITALS — BP 92/58 | HR 65 | Ht 59.0 in | Wt 157.1 lb

## 2020-12-16 DIAGNOSIS — Z8759 Personal history of other complications of pregnancy, childbirth and the puerperium: Secondary | ICD-10-CM

## 2020-12-16 DIAGNOSIS — Z01818 Encounter for other preprocedural examination: Secondary | ICD-10-CM

## 2020-12-16 DIAGNOSIS — O00101 Right tubal pregnancy without intrauterine pregnancy: Secondary | ICD-10-CM

## 2020-12-16 DIAGNOSIS — Z7689 Persons encountering health services in other specified circumstances: Secondary | ICD-10-CM

## 2020-12-16 LAB — HCG, QUANTITATIVE, PREGNANCY: hCG, Beta Chain, Quant, S: 6911 m[IU]/mL — ABNORMAL HIGH (ref <5)

## 2020-12-16 MED ORDER — LACTATED RINGERS IV SOLN: INTRAVENOUS | Status: DC

## 2020-12-16 MED ORDER — POVIDONE-IODINE 10 % EX SWAB: 2.0000 "application " | Freq: Once | CUTANEOUS | Status: DC

## 2020-12-16 NOTE — H&P (Signed)
PRE-OPERATIVE HISTORY AND PHYSICAL EXAM  PCP:  Pcp, No Subjective:   HPI:  Nicole Gross is a 35 y.o. [id number].  Patient's last menstrual period was 10/29/2020 (exact date).  She presents today for a pre-op discussion and PE.  She has the following symptoms: Presented to the ED 9 days ago with some pelvic pain.  No pregnancy identified.  Today found to have right adnexal ectopic pregnancy.  Patient denies pain or bleeding at this time.  Review of Systems:   Constitutional: Denied constitutional symptoms, night sweats, recent illness, fatigue, fever, insomnia and weight loss.  Eyes: Denied eye symptoms, eye pain, photophobia, vision change and visual disturbance.  Ears/Nose/Throat/Neck: Denied ear, nose, throat or neck symptoms, hearing loss, nasal discharge, sinus congestion and sore throat.  Cardiovascular: Denied cardiovascular symptoms, arrhythmia, chest pain/pressure, edema, exercise intolerance, orthopnea and palpitations.  Respiratory: Denied pulmonary symptoms, asthma, pleuritic pain, productive sputum, cough, dyspnea and wheezing.  Gastrointestinal: Denied, gastro-esophageal reflux, melena, nausea and vomiting.  Genitourinary: Denied genitourinary symptoms including symptomatic vaginal discharge, pelvic relaxation issues, and urinary complaints.  Musculoskeletal: Denied musculoskeletal symptoms, stiffness, swelling, muscle weakness and myalgia.  Dermatologic: Denied dermatology symptoms, rash and scar.  Neurologic: Denied neurology symptoms, dizziness, headache, neck pain and syncope.  Psychiatric: Denied psychiatric symptoms, anxiety and depression.  Endocrine: Denied endocrine symptoms including hot flashes and night sweats.   OB History  Gravida Para Term Preterm AB Living  6 0     5    SAB IAB Ectopic Multiple Live Births  3   2        # Outcome Date GA Lbr Len/2nd Weight Sex Delivery Anes PTL Lv  6 Current           5 SAB           4 SAB           3 SAB            2 Ectopic           1 Ectopic             Past Medical History:  Diagnosis Date   Asthma    Ectopic pregnancy    x 2   Pelvic inflammatory disease (PID)     Past Surgical History:  Procedure Laterality Date   ECTOPIC PREGNANCY SURGERY     h/o 1 ectopic on each side      SOCIAL HISTORY:  Social History   Tobacco Use  Smoking Status Every Day   Packs/day: 0.50   Years: 10.00   Pack years: 5.00   Types: Cigarettes  Smokeless Tobacco Never   Social History   Substance and Sexual Activity  Alcohol Use Yes   Comment: occ    Social History   Substance and Sexual Activity  Drug Use Yes   Types: Marijuana    Family History  Problem Relation Age of Onset   Hypertension Mother    Diabetes Mother    Asthma Mother    Hypertension Father    Diabetes Father    Gout Father    Asthma Sister    Hypertension Maternal Grandmother    Gout Paternal Grandmother    Alzheimer's disease Paternal Grandmother    Heart failure Paternal Grandmother     ALLERGIES:  Amoxicillin, Ciprofloxacin, Penicillins, and Codeine  MEDS:   Current Outpatient Medications on File Prior to Visit  Medication Sig Dispense Refill   albuterol (VENTOLIN HFA) 108 (  90 Base) MCG/ACT inhaler Inhale 2 puffs into the lungs every 6 (six) hours as needed for wheezing or shortness of breath. 1 g 1   Multiple Vitamin (MULTIVITAMIN WITH MINERALS) TABS tablet Take 1 tablet by mouth daily.     Spacer/Aero Chamber Mouthpiece MISC 1 Units by Does not apply route every 4 (four) hours as needed (wheezing). 1 each 0   ferrous sulfate 325 (65 FE) MG tablet Take 1 tablet (325 mg total) by mouth daily. (Patient not taking: Reported on 12/16/2020) 30 tablet 0   IBUPROFEN PO Take 1 tablet by mouth once. (Patient not taking: Reported on 12/16/2020)     No current facility-administered medications on file prior to visit.    No orders of the defined types were placed in this encounter.    Physical examination BP  (!) 92/58   Pulse 65   Ht 4\' 11"  (1.499 m)   Wt 157 lb 1.6 oz (71.3 kg)   LMP 10/29/2020 (Exact Date)   BMI 31.73 kg/m   General NAD, Conversant  HEENT Atraumatic; Op clear with mmm.  Normo-cephalic. Pupils reactive. Anicteric sclerae  Thyroid/Neck Smooth without nodularity or enlargement. Normal ROM.  Neck Supple.  Skin No rashes, lesions or ulceration. Normal palpated skin turgor. No nodularity.  Breasts: No masses or discharge.  Symmetric.  No axillary adenopathy.  Lungs: Clear to auscultation.No rales or wheezes. Normal Respiratory effort, no retractions.  Heart: NSR.  No murmurs or rubs appreciated. No periferal edema  Abdomen: Soft.  Non-tender.  No masses.  No HSM. No hernia  Extremities: Moves all appropriately.  Normal ROM for age. No lymphadenopathy.  Neuro: Oriented to PPT.  Normal mood. Normal affect.     Pelvic: Deferred to OR    Ultrasound: No intrauterine gestational sac is noted. 1.7 cm rounded abnormality is seen in the right adnexal region which appears to contain a yolk sac and fetal pole with fetal heart rate on Doppler, consistent with ectopic pregnancy.     Assessment:   [id number] Patient Active Problem List   Diagnosis Date Noted   Overweight (BMI 25.0-29.9) 04/03/2018   PID (acute pelvic inflammatory disease) 07/03/2016   Female pelvic inflammatory disease 09/17/2015   Leukocytosis 09/17/2015   History of gastrointestinal bleeding 09/17/2015   Anemia 09/17/2015   Pyuria 09/17/2015   Tobacco abuse 09/17/2015   Epiploic appendagitis 09/15/2015   Asthma 07/26/2004    1. Encounter to establish care   2. History of ectopic pregnancy   3. Right tubal pregnancy without intrauterine pregnancy   4. Preop examination      Plan:   Orders: No orders of the defined types were placed in this encounter.    1.  Exploratory laparoscopy 2.  Possible salpingectomy

## 2020-12-16 NOTE — H&P (Deleted)
  The note originally documented on this encounter has been moved the the encounter in which it belongs.  

## 2020-12-16 NOTE — Progress Notes (Signed)
HPI:      Nicole Gross is a 35 y.o. [id number] who LMP was Patient's last menstrual period was 10/29/2020 (exact date).  Subjective:   She presents today after being seen in the emergency department approximately a week ago with a positive pregnancy test and abdominal pain.  Her abdominal pain has since resolved.  She denies vaginal bleeding. And ultrasound in the emergency department revealed no intrauterine pregnancy and no ectopic pregnancy. 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  One of the ectopics was a failed methotrexate and she underwent surgery for that.    Hx: The following portions of the patient's history were reviewed and updated as appropriate:             She  has a past medical history of Asthma, Ectopic pregnancy, and Pelvic inflammatory disease (PID). She does not have any pertinent problems on file. She  has a past surgical history that includes Ectopic pregnancy surgery. Her family history includes Alzheimer's disease in her paternal grandmother; Asthma in her mother and sister; Diabetes in her father and mother; Gout in her father and paternal grandmother; Heart failure in her paternal grandmother; Hypertension in her father, maternal grandmother, and mother. She  reports that she has been smoking cigarettes. She has a 5.00 pack-year smoking history. She has never used smokeless tobacco. She reports current alcohol use. She reports current drug use. Drug: Marijuana. She has a current medication list which includes the following prescription(s): albuterol, multivitamin with minerals, spacer/aero chamber mouthpiece, ferrous sulfate, and ibuprofen. She is allergic to amoxicillin, ciprofloxacin, penicillins, and codeine.       Review of Systems:  Review of Systems  Constitutional: Denied constitutional symptoms, night sweats, recent illness, fatigue, fever, insomnia and weight loss.  Eyes: Denied eye symptoms,  eye pain, photophobia, vision change and visual disturbance.  Ears/Nose/Throat/Neck: Denied ear, nose, throat or neck symptoms, hearing loss, nasal discharge, sinus congestion and sore throat.  Cardiovascular: Denied cardiovascular symptoms, arrhythmia, chest pain/pressure, edema, exercise intolerance, orthopnea and palpitations.  Respiratory: Denied pulmonary symptoms, asthma, pleuritic pain, productive sputum, cough, dyspnea and wheezing.  Gastrointestinal: Denied, gastro-esophageal reflux, melena, nausea and vomiting.  Genitourinary: Denied genitourinary symptoms including symptomatic vaginal discharge, pelvic relaxation issues, and urinary complaints.  Musculoskeletal: Denied musculoskeletal symptoms, stiffness, swelling, muscle weakness and myalgia.  Dermatologic: Denied dermatology symptoms, rash and scar.  Neurologic: Denied neurology symptoms, dizziness, headache, neck pain and syncope.  Psychiatric: Denied psychiatric symptoms, anxiety and depression.  Endocrine: Denied endocrine symptoms including hot flashes and night sweats.   Meds:   Current Outpatient Medications on File Prior to Visit  Medication Sig Dispense Refill   albuterol (VENTOLIN HFA) 108 (90 Base) MCG/ACT inhaler Inhale 2 puffs into the lungs every 6 (six) hours as needed for wheezing or shortness of breath. 1 g 1   Multiple Vitamin (MULTIVITAMIN WITH MINERALS) TABS tablet Take 1 tablet by mouth daily.     Spacer/Aero Chamber Mouthpiece MISC 1 Units by Does not apply route every 4 (four) hours as needed (wheezing). 1 each 0   ferrous sulfate 325 (65 FE) MG tablet Take 1 tablet (325 mg total) by mouth daily. (Patient not taking: Reported on 12/16/2020) 30 tablet 0   IBUPROFEN PO Take 1 tablet by mouth once. (Patient not taking: Reported on 12/16/2020)     No current facility-administered medications on file prior to visit.        The pregnancy intention screening data  noted above was reviewed. Potential methods of  contraception were discussed. The patient elected to proceed with Pregnant/Seeking Pregnancy.     Objective:     Vitals:   12/16/20 1230  BP: (!) 92/58  Pulse: 65   Filed Weights   12/16/20 1230  Weight: 157 lb 1.6 oz (71.3 kg)              Last quantitative beta-hCG in the emergency department was 2000.  Ultrasound shows nothing in the uterus or adnexa.  Assessment:    [id number] Patient Active Problem List   Diagnosis Date Noted   Overweight (BMI 25.0-29.9) 04/03/2018   PID (acute pelvic inflammatory disease) 07/03/2016   Female pelvic inflammatory disease 09/17/2015   Leukocytosis 09/17/2015   History of gastrointestinal bleeding 09/17/2015   Anemia 09/17/2015   Pyuria 09/17/2015   Tobacco abuse 09/17/2015   Epiploic appendagitis 09/15/2015   Asthma 07/26/2004     1. Encounter to establish care   2. History of ectopic pregnancy        Plan:            1.  Quantitative beta-hCG today  2.  Urgent follow-up ultrasound  3.  Follow-up for pregnancy confirmation after ultrasound and beta-hCG return. Orders No orders of the defined types were placed in this encounter.   No orders of the defined types were placed in this encounter.     F/U  No follow-ups on file. I spent 32 minutes involved in the care of this patient preparing to see the patient by obtaining and reviewing her medical history (including labs, imaging tests and prior procedures), documenting clinical information in the electronic health record (EHR), counseling and coordinating care plans, writing and sending prescriptions, ordering tests or procedures and directly communicating with the patient by discussing pertinent items from her history and physical exam as well as detailing my assessment and plan as noted above so that she has an informed understanding.  All of her questions were answered.  Finis Bud, M.D. 12/16/2020 12:49 PM  ___________________  ____________________   _________________________  _____________  Received a call from radiology regarding stat ultrasound which revealed a right adnexal ectopic pregnancy with fetal pole and heart tones.  No free fluid.  Was able to have patient return to the office immediately for preop. ______________________________  ________________  ______________________________________        PRE-OPERATIVE HISTORY AND PHYSICAL EXAM  PCP:  Pcp, No Subjective:   HPI:  Nicole Gross is a 35 y.o. [id number].  Patient's last menstrual period was 10/29/2020 (exact date).  She presents today for a pre-op discussion and PE.  She has the following symptoms: Presented to the ED 9 days ago with some pelvic pain.  No pregnancy identified.  Today found to have right adnexal ectopic pregnancy.  Patient denies pain or bleeding at this time.  Review of Systems:   Constitutional: Denied constitutional symptoms, night sweats, recent illness, fatigue, fever, insomnia and weight loss.  Eyes: Denied eye symptoms, eye pain, photophobia, vision change and visual disturbance.  Ears/Nose/Throat/Neck: Denied ear, nose, throat or neck symptoms, hearing loss, nasal discharge, sinus congestion and sore throat.  Cardiovascular: Denied cardiovascular symptoms, arrhythmia, chest pain/pressure, edema, exercise intolerance, orthopnea and palpitations.  Respiratory: Denied pulmonary symptoms, asthma, pleuritic pain, productive sputum, cough, dyspnea and wheezing.  Gastrointestinal: Denied, gastro-esophageal reflux, melena, nausea and vomiting.  Genitourinary: Denied genitourinary symptoms including symptomatic vaginal discharge, pelvic relaxation issues, and urinary complaints.  Musculoskeletal: Denied musculoskeletal symptoms, stiffness, swelling,  muscle weakness and myalgia.  Dermatologic: Denied dermatology symptoms, rash and scar.  Neurologic: Denied neurology symptoms, dizziness, headache, neck pain and syncope.  Psychiatric: Denied psychiatric  symptoms, anxiety and depression.  Endocrine: Denied endocrine symptoms including hot flashes and night sweats.   OB History  Gravida Para Term Preterm AB Living  6 0     5    SAB IAB Ectopic Multiple Live Births  3   2        # Outcome Date GA Lbr Len/2nd Weight Sex Delivery Anes PTL Lv  6 Current           5 SAB           4 SAB           3 SAB           2 Ectopic           1 Ectopic             Past Medical History:  Diagnosis Date   Asthma    Ectopic pregnancy    x 2   Pelvic inflammatory disease (PID)     Past Surgical History:  Procedure Laterality Date   ECTOPIC PREGNANCY SURGERY     h/o 1 ectopic on each side      SOCIAL HISTORY:  Social History   Tobacco Use  Smoking Status Every Day   Packs/day: 0.50   Years: 10.00   Pack years: 5.00   Types: Cigarettes  Smokeless Tobacco Never   Social History   Substance and Sexual Activity  Alcohol Use Yes   Comment: occ    Social History   Substance and Sexual Activity  Drug Use Yes   Types: Marijuana    Family History  Problem Relation Age of Onset   Hypertension Mother    Diabetes Mother    Asthma Mother    Hypertension Father    Diabetes Father    Gout Father    Asthma Sister    Hypertension Maternal Grandmother    Gout Paternal Grandmother    Alzheimer's disease Paternal Grandmother    Heart failure Paternal Grandmother     ALLERGIES:  Amoxicillin, Ciprofloxacin, Penicillins, and Codeine  MEDS:   Current Outpatient Medications on File Prior to Visit  Medication Sig Dispense Refill   albuterol (VENTOLIN HFA) 108 (90 Base) MCG/ACT inhaler Inhale 2 puffs into the lungs every 6 (six) hours as needed for wheezing or shortness of breath. 1 g 1   Multiple Vitamin (MULTIVITAMIN WITH MINERALS) TABS tablet Take 1 tablet by mouth daily.     Spacer/Aero Chamber Mouthpiece MISC 1 Units by Does not apply route every 4 (four) hours as needed (wheezing). 1 each 0   ferrous sulfate 325 (65 FE) MG  tablet Take 1 tablet (325 mg total) by mouth daily. (Patient not taking: Reported on 12/16/2020) 30 tablet 0   IBUPROFEN PO Take 1 tablet by mouth once. (Patient not taking: Reported on 12/16/2020)     No current facility-administered medications on file prior to visit.    No orders of the defined types were placed in this encounter.    Physical examination BP (!) 92/58   Pulse 65   Ht 4\' 11"  (1.499 m)   Wt 157 lb 1.6 oz (71.3 kg)   LMP 10/29/2020 (Exact Date)   BMI 31.73 kg/m   General NAD, Conversant  HEENT Atraumatic; Op clear with mmm.  Normo-cephalic. Pupils reactive. Anicteric sclerae  Thyroid/Neck Smooth without  nodularity or enlargement. Normal ROM.  Neck Supple.  Skin No rashes, lesions or ulceration. Normal palpated skin turgor. No nodularity.  Breasts: No masses or discharge.  Symmetric.  No axillary adenopathy.  Lungs: Clear to auscultation.No rales or wheezes. Normal Respiratory effort, no retractions.  Heart: NSR.  No murmurs or rubs appreciated. No periferal edema  Abdomen: Soft.  Non-tender.  No masses.  No HSM. No hernia  Extremities: Moves all appropriately.  Normal ROM for age. No lymphadenopathy.  Neuro: Oriented to PPT.  Normal mood. Normal affect.     Pelvic: Deferred to OR    Ultrasound: No intrauterine gestational sac is noted. 1.7 cm rounded abnormality is seen in the right adnexal region which appears to contain a yolk sac and fetal pole with fetal heart rate on Doppler, consistent with ectopic pregnancy.     Assessment:   [id number] Patient Active Problem List   Diagnosis Date Noted   Overweight (BMI 25.0-29.9) 04/03/2018   PID (acute pelvic inflammatory disease) 07/03/2016   Female pelvic inflammatory disease 09/17/2015   Leukocytosis 09/17/2015   History of gastrointestinal bleeding 09/17/2015   Anemia 09/17/2015   Pyuria 09/17/2015   Tobacco abuse 09/17/2015   Epiploic appendagitis 09/15/2015   Asthma 07/26/2004    1. Encounter to  establish care   2. History of ectopic pregnancy   3. Right tubal pregnancy without intrauterine pregnancy   4. Preop examination      Plan:   Orders: No orders of the defined types were placed in this encounter.    1.  Exploratory laparoscopy 2.  Possible salpingectomy  Pre-op discussions regarding Risks and Benefits of her scheduled surgery.  Exp Lyp Mass We have discussed the procedure of Exploratory Laparoscopy in detail.  She is aware t that she has a right tubal ectopic pregnancy.  Medical management is not an option because of the size of the ectopic.  She has been informed that salpingectomy may be necessary but our goal is to maintain at least 1 functional appearing tube.  I have informed her that Laparoscopy, like other surgical procedures, entails the following risks:  bleeding, infection, damage to bowel, bladder or other internal organ, and the risk or anesthesia.  She is aware that her risks are not limited to these.  I have informed her that previous tubal surgery work for that matter any prior pelvic surgery increases the risk of this surgery.  I have answered all of her questions and I believe she has been well informed regarding the risks/benefits of Exploratory Laparoscopy for pelvic pain and pelvic mass.  I spent 35 minutes involved in the care of this patient preparing to see the patient by obtaining and reviewing her medical history (including labs, imaging tests and prior procedures), documenting clinical information in the electronic health record (EHR), counseling and coordinating care plans, writing and sending prescriptions, ordering tests or procedures and directly communicating with the patient by discussing pertinent items from her history and physical exam as well as detailing my assessment and plan as noted above so that she has an informed understanding.  All of her questions were answered. This time has now been added to the above time to create total time of  greater than 65 minutes.  Finis Bud, M.D. 12/16/2020 4:44 PM

## 2020-12-17 ENCOUNTER — Encounter: Admission: RE | Payer: Self-pay | Source: Home / Self Care

## 2020-12-17 ENCOUNTER — Ambulatory Visit: Admission: RE | Admit: 2020-12-17 | Payer: Self-pay | Source: Home / Self Care | Admitting: Obstetrics and Gynecology

## 2020-12-17 SURGERY — LAPAROSCOPY, WITH ECTOPIC PREGNANCY SURGICAL TREATMENT
Anesthesia: Choice | Laterality: Right

## 2020-12-17 NOTE — Progress Notes (Signed)
Spoke to patient via phone to confirm procedure arrival time. Patient stated she needed to reschedule because her mother was sick.  Informed patient this was an emergent procedure and cautioned her about postponing.  She said she still wanted to reschedule and was told to call Dr. Amalia Hailey office to reschedule.

## 2020-12-18 ENCOUNTER — Encounter: Payer: Self-pay | Admitting: Obstetrics and Gynecology

## 2020-12-18 ENCOUNTER — Ambulatory Visit
Admission: RE | Admit: 2020-12-18 | Discharge: 2020-12-19 | Disposition: A | Discharge status: Home / Self Care | Payer: Medicaid Other | Source: Ambulatory Visit | Attending: Obstetrics and Gynecology | Admitting: Obstetrics and Gynecology

## 2020-12-18 ENCOUNTER — Telehealth: Payer: Self-pay | Admitting: Obstetrics and Gynecology

## 2020-12-18 DIAGNOSIS — O00101 Right tubal pregnancy without intrauterine pregnancy: Secondary | ICD-10-CM | POA: Diagnosis not present

## 2020-12-18 DIAGNOSIS — K660 Peritoneal adhesions (postprocedural) (postinfection): Secondary | ICD-10-CM | POA: Diagnosis not present

## 2020-12-18 DIAGNOSIS — Z3A00 Weeks of gestation of pregnancy not specified: Secondary | ICD-10-CM | POA: Insufficient documentation

## 2020-12-18 DIAGNOSIS — N7091 Salpingitis, unspecified: Secondary | ICD-10-CM | POA: Insufficient documentation

## 2020-12-18 LAB — CBC
HCT: 41.6 % (ref 36.0–46.0)
Hemoglobin: 13.5 g/dL (ref 12.0–15.0)
MCH: 29.1 pg (ref 26.0–34.0)
MCHC: 32.5 g/dL (ref 30.0–36.0)
MCV: 89.7 fL (ref 80.0–100.0)
Platelets: 364 K/uL (ref 150–400)
RBC: 4.64 MIL/uL (ref 3.87–5.11)
RDW: 16.8 % — ABNORMAL HIGH (ref 11.5–15.5)
WBC: 11.2 K/uL — ABNORMAL HIGH (ref 4.0–10.5)
nRBC: 0 % (ref 0.0–0.2)

## 2020-12-18 LAB — TYPE AND SCREEN
ABO/RH(D): O POS
Antibody Screen: NEGATIVE

## 2020-12-18 NOTE — Telephone Encounter (Signed)
Received call this morning through hospital operator from patient who noted that she was scheduled for surgery yesterday for her ectopic pregnancy and had to cancel due to family member being ill and later passing yesterday.  Today notes that she has begun experiencing pelvic pain, moderately relieved with Tylenol. Patient desires to know when her surgery can be rescheduled.  Currently denies bleeding, nausea/vomiting, chills. Last ate around 8:45 this morning.   Contacted OR, who notes that patient can have surgery later today, as long as she remains relatively stable she can be directly admitted, and surgery can be scheduled for ~ 4 or 5 pm today, otherwise, if condition changes may need to f/u in ER for faster evaluation. Discussed plans with patient to proceed as outpatient for now unless status changes.   Patient has previously been counseled regarding nature of procedure by Dr. Jeannie Fend. Further discussed procedure with patient, currently has h/o 2 ectopics (1 on each side), prior h/o PID.  Discussed option of salpingostomy (however discussed risk that she is more likely to continue to experience ectopics the more she has in a particular tube), vs salpingectomy.  Discussed future fertility plans with patient. Currently notes that right now she does not plan for another pregnancy but does not know definitive future plans.  Advised that she could potentially still become pregnant in the future with 1 tube, but would still be at increased risk for another ectopic on that side. Discussed that if possible, she may benefit from speciality care from [facility] to bypass fallopian tubes. Patient notes understanding.   To present to registration desk at 3:00 pm to be admitted to pre-op area for scheduled surgery.   Rubie Maid, MD [facility]

## 2020-12-18 NOTE — Anesthesia Preprocedure Evaluation (Addendum)
Anesthesia Evaluation  Patient identified by MRN, date of birth, ID band Patient awake  General Assessment Comment: Patient vomited this morning  Reviewed: Allergy & Precautions, NPO status , Patient's Chart, lab work & pertinent test results  History of Anesthesia Complications Negative for: history of anesthetic complications  Airway Mallampati: II  TM Distance: >3 FB Neck ROM: Full    Dental no notable dental hx. (+) Teeth Intact   Pulmonary asthma , neg sleep apnea, neg COPD, Current Smoker and Patient abstained from smoking.,    Pulmonary exam normal breath sounds clear to auscultation       Cardiovascular Exercise Tolerance: Good METS(-) hypertension(-) CAD and (-) Past MI negative cardio ROS  (-) dysrhythmias  Rhythm:Regular Rate:Normal - Systolic murmurs    Neuro/Psych negative neurological ROS  negative psych ROS   GI/Hepatic neg GERD  ,(+)     (-) substance abuse  ,   Endo/Other  neg diabetes  Renal/GU negative Renal ROS     Musculoskeletal   Abdominal   Peds  Hematology   Anesthesia Other Findings Past Medical History: No date: Asthma No date: Ectopic pregnancy     Comment:  x 2 No date: Pelvic inflammatory disease (PID)  Reproductive/Obstetrics Ectopic pregnancy                            Anesthesia Physical Anesthesia Plan  ASA: 2 and emergent  Anesthesia Plan: General   Post-op Pain Management:    Induction: Intravenous and Rapid sequence  PONV Risk Score and Plan: 3 and Ondansetron, Dexamethasone and Midazolam  Airway Management Planned: Oral ETT and Video Laryngoscope Planned  Additional Equipment: None  Intra-op Plan:   Post-operative Plan: Extubation in OR  Informed Consent: I have reviewed the patients History and Physical, chart, labs and discussed the procedure including the risks, benefits and alternatives for the proposed anesthesia with the  patient or authorized representative who has indicated his/her understanding and acceptance.     Dental advisory given  Plan Discussed with: CRNA and Surgeon  Anesthesia Plan Comments: (Discussed risks of anesthesia with patient, including PONV, sore throat, lip/dental damage. Rare risks discussed as well, such as cardiorespiratory and neurological sequelae. Patient understands.)        Anesthesia Quick Evaluation

## 2020-12-19 ENCOUNTER — Ambulatory Visit: Payer: Medicaid Other | Admitting: Certified Registered Nurse Anesthetist

## 2020-12-19 ENCOUNTER — Encounter
Admission: RE | Disposition: A | Discharge status: Home / Self Care | Payer: Self-pay | Source: Ambulatory Visit | Attending: Obstetrics and Gynecology

## 2020-12-19 ENCOUNTER — Other Ambulatory Visit: Payer: Self-pay

## 2020-12-19 ENCOUNTER — Encounter: Payer: Self-pay | Admitting: Obstetrics and Gynecology

## 2020-12-19 DIAGNOSIS — O00101 Right tubal pregnancy without intrauterine pregnancy: Secondary | ICD-10-CM | POA: Diagnosis not present

## 2020-12-19 HISTORY — PX: DIAGNOSTIC LAPAROSCOPY WITH REMOVAL OF ECTOPIC PREGNANCY: SHX6449

## 2020-12-19 SURGERY — LAPAROSCOPY, WITH ECTOPIC PREGNANCY SURGICAL TREATMENT
Anesthesia: General | Laterality: Right

## 2020-12-19 MED ORDER — ACETAMINOPHEN 10 MG/ML IV SOLN
1000.0000 mg | Freq: Once | INTRAVENOUS | Status: DC | PRN
Start: 2020-12-19 — End: 2020-12-19

## 2020-12-19 MED ORDER — FENTANYL CITRATE (PF) 100 MCG/2ML IJ SOLN
INTRAMUSCULAR | Status: AC
  Filled 2020-12-19: qty 2

## 2020-12-19 MED ORDER — FENTANYL CITRATE (PF) 100 MCG/2ML IJ SOLN
25.0000 ug | INTRAMUSCULAR | Status: DC | PRN
  Administered 2020-12-19: 50 ug via INTRAVENOUS
  Administered 2020-12-19: 25 ug via INTRAVENOUS

## 2020-12-19 MED ORDER — SODIUM CHLORIDE FLUSH 0.9 % IV SOLN
INTRAVENOUS | Status: AC
  Filled 2020-12-19: qty 10

## 2020-12-19 MED ORDER — SUGAMMADEX SODIUM 200 MG/2ML IV SOLN
INTRAVENOUS | Status: DC | PRN
  Administered 2020-12-19: 200 mg via INTRAVENOUS

## 2020-12-19 MED ORDER — LACTATED RINGERS IV SOLN: INTRAVENOUS | Status: DC

## 2020-12-19 MED ORDER — PROPOFOL 10 MG/ML IV BOLUS
INTRAVENOUS | Status: DC | PRN
  Administered 2020-12-19: 180 mg via INTRAVENOUS

## 2020-12-19 MED ORDER — FENTANYL CITRATE (PF) 100 MCG/2ML IJ SOLN
INTRAMUSCULAR | Status: AC
  Administered 2020-12-19: 50 ug via INTRAVENOUS
  Filled 2020-12-19: qty 2

## 2020-12-19 MED ORDER — BUPIVACAINE HCL 0.5 % IJ SOLN
INTRAMUSCULAR | Status: DC | PRN
  Administered 2020-12-19: 15 mL

## 2020-12-19 MED ORDER — DEXAMETHASONE SODIUM PHOSPHATE 10 MG/ML IJ SOLN
INTRAMUSCULAR | Status: DC | PRN
  Administered 2020-12-19: 10 mg via INTRAVENOUS

## 2020-12-19 MED ORDER — CHLORHEXIDINE GLUCONATE 0.12 % MT SOLN
15.0000 mL | Freq: Once | OROMUCOSAL | Status: AC
  Administered 2020-12-19: 15 mL via OROMUCOSAL

## 2020-12-19 MED ORDER — ONDANSETRON HCL 4 MG/2ML IJ SOLN: 4.0000 mg | Freq: Once | INTRAMUSCULAR | Status: AC

## 2020-12-19 MED ORDER — ONDANSETRON HCL 4 MG/2ML IJ SOLN
INTRAMUSCULAR | Status: AC
  Administered 2020-12-19: 4 mg via INTRAVENOUS
  Filled 2020-12-19: qty 2

## 2020-12-19 MED ORDER — ONDANSETRON HCL 4 MG/2ML IJ SOLN: 4.0000 mg | Freq: Once | INTRAMUSCULAR | Status: DC | PRN

## 2020-12-19 MED ORDER — ACETAMINOPHEN 10 MG/ML IV SOLN
INTRAVENOUS | Status: DC | PRN
  Administered 2020-12-19: 1000 mg via INTRAVENOUS

## 2020-12-19 MED ORDER — FAMOTIDINE 20 MG PO TABS
ORAL_TABLET | ORAL | Status: AC
  Administered 2020-12-19: 20 mg via ORAL
  Filled 2020-12-19: qty 1

## 2020-12-19 MED ORDER — LIDOCAINE HCL (CARDIAC) PF 100 MG/5ML IV SOSY
PREFILLED_SYRINGE | INTRAVENOUS | Status: DC | PRN
  Administered 2020-12-19: 80 mg via INTRAVENOUS

## 2020-12-19 MED ORDER — SUCCINYLCHOLINE CHLORIDE 20 MG/ML IJ SOLN
INTRAMUSCULAR | Status: DC | PRN
  Administered 2020-12-19: 80 mg via INTRAVENOUS

## 2020-12-19 MED ORDER — ORAL CARE MOUTH RINSE: 15.0000 mL | Freq: Once | OROMUCOSAL | Status: AC

## 2020-12-19 MED ORDER — MIDAZOLAM HCL 2 MG/2ML IJ SOLN
INTRAMUSCULAR | Status: DC | PRN
  Administered 2020-12-19: 2 mg via INTRAVENOUS

## 2020-12-19 MED ORDER — ORAL CARE MOUTH RINSE: 15.0000 mL | Freq: Once | OROMUCOSAL | Status: DC

## 2020-12-19 MED ORDER — PHENYLEPHRINE HCL (PRESSORS) 10 MG/ML IV SOLN
INTRAVENOUS | Status: DC | PRN
  Administered 2020-12-19: 100 ug via INTRAVENOUS

## 2020-12-19 MED ORDER — ONDANSETRON HCL 4 MG/2ML IJ SOLN
INTRAMUSCULAR | Status: DC | PRN
  Administered 2020-12-19: 4 mg via INTRAVENOUS

## 2020-12-19 MED ORDER — OXYCODONE-ACETAMINOPHEN 5-325 MG PO TABS
ORAL_TABLET | ORAL | Status: AC
  Administered 2020-12-19: 2
  Filled 2020-12-19: qty 2

## 2020-12-19 MED ORDER — CHLORHEXIDINE GLUCONATE 0.12 % MT SOLN
OROMUCOSAL | Status: AC
  Filled 2020-12-19: qty 15

## 2020-12-19 MED ORDER — ROCURONIUM BROMIDE 100 MG/10ML IV SOLN
INTRAVENOUS | Status: DC | PRN
  Administered 2020-12-19: 40 mg via INTRAVENOUS

## 2020-12-19 MED ORDER — FENTANYL CITRATE (PF) 100 MCG/2ML IJ SOLN
INTRAMUSCULAR | Status: DC | PRN
  Administered 2020-12-19 (×2): 50 ug via INTRAVENOUS

## 2020-12-19 MED ORDER — CHLORHEXIDINE GLUCONATE 0.12 % MT SOLN: 15.0000 mL | Freq: Once | OROMUCOSAL | Status: DC

## 2020-12-19 MED ORDER — DEXMEDETOMIDINE (PRECEDEX) IN NS 20 MCG/5ML (4 MCG/ML) IV SYRINGE
PREFILLED_SYRINGE | INTRAVENOUS | Status: DC | PRN
  Administered 2020-12-19: 20 ug via INTRAVENOUS
  Administered 2020-12-19: 12 ug via INTRAVENOUS
  Administered 2020-12-19: 8 ug via INTRAVENOUS

## 2020-12-19 MED ORDER — IBUPROFEN 800 MG PO TABS: 800.0000 mg | ORAL_TABLET | Freq: Three times a day (TID) | ORAL | 1 refills | Status: DC | PRN

## 2020-12-19 MED ORDER — KETOROLAC TROMETHAMINE 30 MG/ML IJ SOLN
INTRAMUSCULAR | Status: DC | PRN
  Administered 2020-12-19: 30 mg via INTRAVENOUS

## 2020-12-19 MED ORDER — GLYCOPYRROLATE 0.2 MG/ML IJ SOLN
INTRAMUSCULAR | Status: DC | PRN
  Administered 2020-12-19: .2 mg via INTRAVENOUS

## 2020-12-19 MED ORDER — FAMOTIDINE 20 MG PO TABS: 20.0000 mg | ORAL_TABLET | Freq: Once | ORAL | Status: AC

## 2020-12-19 MED ORDER — OXYCODONE-ACETAMINOPHEN 5-325 MG PO TABS: 1.0000 | ORAL_TABLET | Freq: Four times a day (QID) | ORAL | 0 refills | Status: DC | PRN

## 2020-12-19 SURGICAL SUPPLY — 41 items
ANCHOR TIS RET SYS 235ML (MISCELLANEOUS) ×2 IMPLANT
BLADE SURG SZ11 CARB STEEL (BLADE) ×2 IMPLANT
CANISTER SUCT 1200ML W/VALVE (MISCELLANEOUS) ×2 IMPLANT
CATH ROBINSON RED A/P 16FR (CATHETERS) ×2 IMPLANT
CHLORAPREP W/TINT 26 (MISCELLANEOUS) ×2 IMPLANT
CORD MONOPOLAR M/FML 12FT (MISCELLANEOUS) ×2 IMPLANT
COVER WAND RF STERILE (DRAPES) IMPLANT
DERMABOND ADVANCED (GAUZE/BANDAGES/DRESSINGS) ×1
DERMABOND ADVANCED .7 DNX12 (GAUZE/BANDAGES/DRESSINGS) ×1 IMPLANT
GAUZE 4X4 16PLY RFD (DISPOSABLE) ×2 IMPLANT
GLOVE SURG ENC MOIS LTX SZ6.5 (GLOVE) ×2 IMPLANT
GLOVE SURG ENC MOIS LTX SZ8 (GLOVE) ×2 IMPLANT
GLOVE SURG UNDER LTX SZ7 (GLOVE) ×2 IMPLANT
GOWN STRL REUS W/ TWL LRG LVL3 (GOWN DISPOSABLE) ×2 IMPLANT
GOWN STRL REUS W/TWL LRG LVL3 (GOWN DISPOSABLE) ×2
GOWN STRL REUS W/TWL XL LVL4 (GOWN DISPOSABLE) ×2 IMPLANT
GRASPER SUT TROCAR 14GX15 (MISCELLANEOUS) ×2 IMPLANT
IRRIGATION STRYKERFLOW (MISCELLANEOUS) ×1 IMPLANT
IRRIGATOR STRYKERFLOW (MISCELLANEOUS) ×2
IV LACTATED RINGERS 1000ML (IV SOLUTION) ×2 IMPLANT
KIT PINK PAD W/HEAD ARE REST (MISCELLANEOUS) ×2 IMPLANT
KIT PINK PAD W/HEAD ARM REST (MISCELLANEOUS) ×1 IMPLANT
KIT TURNOVER CYSTO (KITS) ×2 IMPLANT
LIGASURE LAP MARYLAND 5MM 37CM (ELECTROSURGICAL) ×2 IMPLANT
MANIFOLD NEPTUNE II (INSTRUMENTS) ×2 IMPLANT
NS IRRIG 500ML POUR BTL (IV SOLUTION) ×2 IMPLANT
PACK GYN LAPAROSCOPIC (MISCELLANEOUS) ×2 IMPLANT
PAD OB MATERNITY 4.3X12.25 (PERSONAL CARE ITEMS) ×2 IMPLANT
PAD PREP 24X41 OB/GYN DISP (PERSONAL CARE ITEMS) ×2 IMPLANT
POUCH ENDO CATCH 10MM SPEC (MISCELLANEOUS) IMPLANT
POUCH SPECIMEN RETRIEVAL 10MM (ENDOMECHANICALS) IMPLANT
SCISSORS METZENBAUM CVD 33 (INSTRUMENTS) IMPLANT
SET TUBE SMOKE EVAC HIGH FLOW (TUBING) ×2 IMPLANT
SHEARS HARMONIC ACE PLUS 36CM (ENDOMECHANICALS) IMPLANT
SLEEVE ENDOPATH XCEL 5M (ENDOMECHANICALS) ×2 IMPLANT
SUT VIC AB 3-0 SH 27 (SUTURE)
SUT VIC AB 3-0 SH 27X BRD (SUTURE) IMPLANT
SUT VICRYL 0 AB UR-6 (SUTURE) ×2 IMPLANT
TROCAR ENDO BLADELESS 11MM (ENDOMECHANICALS) ×2 IMPLANT
TROCAR XCEL NON-BLD 5MMX100MML (ENDOMECHANICALS) ×2 IMPLANT
TROCAR XCEL UNIV SLVE 11M 100M (ENDOMECHANICALS) ×2 IMPLANT

## 2020-12-19 NOTE — H&P (Signed)
UPDATE TO PREVIOUS HISTORY AND PHYSICAL  The patient has been seen and examined.  H&P performed by Dr. Jeannie Fend on 12/16/2020 is up to date, no changes noted.  She has been consented for LAPAROSCOPIC RIGHT SALPINGECTOMY (POSSIBLE SALPINGOSTOMY).  She has been previously counseled on risks and benefits of procedure. All questions answered at this time. Patient can proceed to the OR for scheduled procedure.   Rubie Maid, MD 12/19/2020 10:22 AM

## 2020-12-19 NOTE — Anesthesia Postprocedure Evaluation (Signed)
Anesthesia Post Note  Patient: Nicole Gross  Procedure(s) Performed: DIAGNOSTIC LAPAROSCOPY WITH REMOVAL OF ECTOPIC PREGNANCY (Right)  Patient location during evaluation: PACU Anesthesia Type: General Level of consciousness: awake and alert Pain management: pain level controlled Vital Signs Assessment: post-procedure vital signs reviewed and stable Respiratory status: spontaneous breathing, nonlabored ventilation, respiratory function stable and patient connected to nasal cannula oxygen Cardiovascular status: blood pressure returned to baseline and stable Postop Assessment: no apparent nausea or vomiting Anesthetic complications: no   No notable events documented.   Last Vitals:  Vitals:   12/19/20 1327 12/19/20 1338  BP: 93/62   Pulse: 73 64  Resp: (!) 22 18  Temp: (!) 36.3 C (!) 36.4 C  SpO2: 98% 99%    Last Pain:  Vitals:   12/19/20 1338  TempSrc: Temporal  PainSc:                  Arita Miss

## 2020-12-19 NOTE — Discharge Instructions (Signed)

## 2020-12-19 NOTE — Anesthesia Procedure Notes (Signed)
Procedure Name: Intubation Date/Time: 12/19/2020 12:00 PM Performed by: Kelton Pillar, CRNA Pre-anesthesia Checklist: Patient identified, Emergency Drugs available, Suction available and Patient being monitored Patient Re-evaluated:Patient Re-evaluated prior to induction Oxygen Delivery Method: Circle system utilized Preoxygenation: Pre-oxygenation with 100% oxygen Induction Type: IV induction and Rapid sequence Laryngoscope Size: McGraph and 3 Grade View: Grade I Tube type: Oral Tube size: 6.5 mm Number of attempts: 1 Airway Equipment and Method: Stylet Placement Confirmation: ETT inserted through vocal cords under direct vision, positive ETCO2 and breath sounds checked- equal and bilateral Secured at: 20 cm Tube secured with: Tape Dental Injury: Teeth and Oropharynx as per pre-operative assessment

## 2020-12-19 NOTE — Transfer of Care (Signed)
Immediate Anesthesia Transfer of Care Note  Patient: Nicole Gross  Procedure(s) Performed: DIAGNOSTIC LAPAROSCOPY WITH REMOVAL OF ECTOPIC PREGNANCY (Right)  Patient Location: PACU  Anesthesia Type:General  Level of Consciousness: awake, drowsy and patient cooperative  Airway & Oxygen Therapy: Patient Spontanous Breathing and Patient connected to face mask oxygen  Post-op Assessment: Report given to RN and Post -op Vital signs reviewed and stable  Post vital signs: Reviewed and stable  Last Vitals:  Vitals Value Taken Time  BP 91/72 12/19/20 1247  Temp    Pulse 73 12/19/20 1249  Resp 16 12/19/20 1249  SpO2 99 % 12/19/20 1249  Vitals shown include unvalidated device data.  Last Pain:  Vitals:   12/19/20 0921  TempSrc: Temporal  PainSc: 5       Patients Stated Pain Goal: 0 (33/83/29 1916)  Complications: No notable events documented.

## 2020-12-19 NOTE — Op Note (Signed)
Procedure(s): DIAGNOSTIC LAPAROSCOPY WITH REMOVAL OF ECTOPIC PREGNANCY Procedure Note  Nicole Gross female 35 y.o. 12/19/2020  Indications: The patient is a 35 y.o. G13P0050 female with right ectopic pregnancy.  Patient has had h/o 2 prior ectopic pregnancies (1 on each side) and h/o PID and uterine fibroids. Please refer to preoperative notes for more details. Patient was counseled regarding need for laparoscopic salpingectomy. Risks of surgery including bleeding which may require transfusion or reoperation, infection, injury to bowel or other surrounding organs, need for additional procedures including laparotomy and other postoperative/anesthesia complications were explained to patient.  Written informed consent was obtained.  Pre-operative Diagnosis: Right ectopic pregnancy.  History of prior ectopic pregnancy x 2, uterine fibroids.   Post-operative Diagnosis: Same, with few  abdominal adhesions  Surgeon: Rubie Maid, MD  Assistants:  Surgical scrub assist.  Anesthesia: General endotracheal anesthesia  Findings: No hemoperitoneum noted. Dilated right fallopian tube containing ectopic gestation near isthmus. Enlarge uterus with multiple fibroids, submucosal and subserosal.  Partially interrupted left fallopian tube, normal right ovary and left ovary.  Procedure Details: The patient was seen in the Holding Room. The risks, benefits, complications, treatment options, and expected outcomes were discussed with the patient.  The patient concurred with the proposed plan, giving informed consent.  The site of surgery properly noted/marked. The patient was taken to the Operating Room, identified as Nicole Gross and the procedure verified as Procedure(s) (LRB): DIAGNOSTIC LAPAROSCOPY WITH REMOVAL OF ECTOPIC PREGNANCY (Right). A Time Out was held and the above information confirmed.  She was then placed under general anesthesia without difficulty. She was placed in the dorsal lithotomy  position, and was prepped and draped in a sterile manner.  A straight catheterization was performed. A sterile speculum was inserted into the vagina and the cervix was grasped at the anterior lip using a single-toothed tenaculum.  The uterus was sounded to ~ 5 cm, however obstruction noted and unable to advance the sound beyond this (likely secondary to a fibroid). A Hulka clamp was placed for uterine manipulation.  The speculum and tenaculum were then removed. After an adequate timeout was performed, attention was turned to the abdomen where an umbilical incision was made with the scalpel.  The Optiview 5-mm trocar and sleeve were then advanced without difficulty with the laparoscope under direct visualization into the abdomen.  The abdomen was then insufflated with carbon dioxide gas and adequate pneumoperitoneum was obtained. A 5-mm left lower quadrant port and an 11-mm right lower quadrant port were then placed under direct visualization.  A survey of the patient's pelvis and abdomen revealed the findings as above.  Attention was then turned to the right fallopian tube which was grasped and ligated from the underlying mesosalpinx and uterine attachment using the Ligasure instrument.  Good hemostasis was noted.  The specimen was placed in an EndoCatch bag and removed from the abdomen intact.  .  The fascial incision of the 11-mm site was reapproximated with a 0 Vicryl figure-of-eight stitch using the cone and PMI device.  The abdomen was desufflated, and all other instruments were removed.  All skin incisions were closed with 4-0 Monocryl subcuticular stitches. Dermabond was placed over the incisions.  The incisions were injected with a total of 14 ml of 0.5% Sensorcaine. The patient tolerated the procedures well.  All instruments, needles, and sponge counts were correct x 2. The patient was taken to the recovery room awake, extubated and in stable condition. ; and all skin incisions were closed with 4-0  Monocryl and Dermabond. The patient tolerated the procedure well.  Sponge, lap, and needle counts were correct times three.  The patient was then taken to the recovery room awake, extubated and in stable condition.   The patient will be discharged to home as per PACU criteria.  Routine postoperative instructions given.  She was prescribed Percocet, Ibuprofen and Colace.  She will follow up in the office in about 1-2 weeks for postoperative evaluation.  Estimated Blood Loss:  minimal (<5)      Drains: straight catheterization prior to procedure with 200 ml of clear urine         Total IV Fluids:  1000 ml  Specimens: Right fallopian tube containing ectopic gestation         Implants: None         Complications:  None; patient tolerated the procedure well.         Disposition: PACU - hemodynamically stable.         Condition: stable   Rubie Maid, MD [facility]

## 2020-12-20 ENCOUNTER — Telehealth: Payer: Self-pay | Admitting: Obstetrics and Gynecology

## 2020-12-20 ENCOUNTER — Encounter: Payer: Self-pay | Admitting: Obstetrics and Gynecology

## 2020-12-20 NOTE — Telephone Encounter (Signed)
Pt called no answer LM via VM that AC sent in 2  rx for pain to [facility] on [address]. Informed pt that one for ibuprofen and the other one was oxycodone-acetaminophen (percocet) pt was advised to contact the pharmacy on the status of the medication.

## 2020-12-20 NOTE — Telephone Encounter (Signed)
Nicole Gross called in and states she is in a lot of pain.  [name] wants to know if she can get a different pain medication other than Ibuprofen.   Please advise.

## 2020-12-20 NOTE — Telephone Encounter (Signed)
Patient called states that she had spoke with Dr. Rebekah Chesterfield nurse about short- term disability, pt asked for nurse to call her about that's, she has questions. Please advise.

## 2020-12-21 LAB — SURGICAL PATHOLOGY

## 2020-12-21 NOTE — Telephone Encounter (Signed)
Spoke with patient about her FMLA. Patient does not think that she can go back to work this Friday. She is wanting to see about FMLA paperwork.  During the conversation patient stated that she is having swelling in her legs, feet and abdomin where the tube was removed. Patient does not have post op appointment scheduled.  Please advise.

## 2020-12-21 NOTE — Telephone Encounter (Signed)
LM for patient to return call.

## 2020-12-22 ENCOUNTER — Encounter: Payer: Self-pay | Admitting: Obstetrics and Gynecology

## 2020-12-22 ENCOUNTER — Telehealth: Payer: Self-pay | Admitting: Obstetrics and Gynecology

## 2020-12-22 NOTE — Telephone Encounter (Signed)
Patient has been scheduled to come in to see Dr. Marcelline Mates at 11:30 today.

## 2020-12-22 NOTE — Telephone Encounter (Signed)
Please advise 

## 2020-12-22 NOTE — Telephone Encounter (Signed)
Patient called this morning and states that she is bleeding and has noticed it has gotten heavier.  She stated that she was spotting and over night she has started to saturate a pad.  She stated that she was going to the ED for swelling.  I told patient to let the ED know that she has started bleeding as well.

## 2020-12-23 ENCOUNTER — Other Ambulatory Visit: Payer: Self-pay

## 2020-12-23 ENCOUNTER — Emergency Department
Admission: EM | Admit: 2020-12-23 | Discharge: 2020-12-23 | Disposition: A | Discharge status: Home / Self Care | Payer: Medicaid Other | Attending: Emergency Medicine | Admitting: Emergency Medicine

## 2020-12-23 ENCOUNTER — Emergency Department: Payer: Medicaid Other

## 2020-12-23 DIAGNOSIS — J45909 Unspecified asthma, uncomplicated: Secondary | ICD-10-CM | POA: Insufficient documentation

## 2020-12-23 DIAGNOSIS — R600 Localized edema: Secondary | ICD-10-CM | POA: Diagnosis not present

## 2020-12-23 DIAGNOSIS — F17210 Nicotine dependence, cigarettes, uncomplicated: Secondary | ICD-10-CM | POA: Insufficient documentation

## 2020-12-23 DIAGNOSIS — M7989 Other specified soft tissue disorders: Secondary | ICD-10-CM

## 2020-12-23 LAB — BASIC METABOLIC PANEL
Anion gap: 6 (ref 5–15)
BUN: 11 mg/dL (ref 6–20)
CO2: 25 mmol/L (ref 22–32)
Calcium: 8.2 mg/dL — ABNORMAL LOW (ref 8.9–10.3)
Chloride: 107 mmol/L (ref 98–111)
Creatinine, Ser: 0.67 mg/dL (ref 0.44–1.00)
GFR, Estimated: >60 mL/min (ref >60)
Glucose, Bld: 94 mg/dL (ref 70–99)
Potassium: 3.6 mmol/L (ref 3.5–5.1)
Sodium: 138 mmol/L (ref 135–145)

## 2020-12-23 LAB — CBC
HCT: 33.8 % — ABNORMAL LOW (ref 36.0–46.0)
Hemoglobin: 11.1 g/dL — ABNORMAL LOW (ref 12.0–15.0)
MCH: 29.3 pg (ref 26.0–34.0)
MCHC: 32.8 g/dL (ref 30.0–36.0)
MCV: 89.2 fL (ref 80.0–100.0)
Platelets: 334 10*3/uL (ref 150–400)
RBC: 3.79 MIL/uL — ABNORMAL LOW (ref 3.87–5.11)
RDW: 15.8 % — ABNORMAL HIGH (ref 11.5–15.5)
WBC: 8.9 10*3/uL (ref 4.0–10.5)
nRBC: 0 % (ref 0.0–0.2)

## 2020-12-23 MED ORDER — FUROSEMIDE 20 MG PO TABS: 20.0000 mg | ORAL_TABLET | Freq: Every day | ORAL | 0 refills | Status: DC

## 2020-12-23 NOTE — ED Notes (Signed)
Ultrasound at bedside

## 2020-12-23 NOTE — ED Triage Notes (Signed)
Pt to ED POV for bilateral leg swelling x2 days since having ectopic pregnancy and having tube removed on Sunday.  Swelling noted to BLE Denies shob  Verbal orders dr Charna Archer

## 2020-12-23 NOTE — ED Notes (Addendum)
Pt to ED c/o bilateral lower leg swelling that began 3d ago. Pt had R salpingectomy 4d ago. +3 pitting edema noted to LLE to knee. +2 pitting edema noted to RLE to knee. Denies CP, SOB. Lower extremities do not appear red, warm and are not tender to palpation.  RLQ abdomen is tender to light palpation. Pt also states had "2 clots" yesterday. Showed pictures on phone, clots appear to have been 2-4 tbsp each. Pt denies dizziness. States has not had clots since then and denies dizziness. Still has bright red lochia, changing regular size pad q 3-4 hours, not saturated.

## 2020-12-23 NOTE — ED Provider Notes (Signed)
[facility] Emergency Department Provider Note   ____________________________________________   Event Date/Time   First MD Initiated Contact with Patient 12/23/20 1214     (approximate)  I have reviewed the triage vital signs and the nursing notes.   HISTORY  Chief Complaint Post-op Problem    HPI Nicole Gross is a 35 y.o. female with past medical history of asthma, PID, and ectopic pregnancy status post salpingectomy who presents to the ED complaining of leg swelling.  Patient reports that she has had increasing swelling in her bilateral lower extremities, left greater than right for the past 4 days.  She had right-sided ectopic pregnancy requiring right salpingectomy 4 days ago, states she has been doing well otherwise with improving abdominal pain.  She had noticed light vaginal bleeding that has improved since her procedure.  She does state that she has had a difficult time getting up and moving around following her procedure.  She denies any fevers, nausea, vomiting, chest pain, or shortness of breath.  She denies any history of DVT/PE.        Past Medical History:  Diagnosis Date   Asthma    Ectopic pregnancy    x 2   Pelvic inflammatory disease (PID)     Patient Active Problem List   Diagnosis Date Noted   Overweight (BMI 25.0-29.9) 04/03/2018   PID (acute pelvic inflammatory disease) 07/03/2016   Female pelvic inflammatory disease 09/17/2015   Leukocytosis 09/17/2015   History of gastrointestinal bleeding 09/17/2015   Anemia 09/17/2015   Pyuria 09/17/2015   Tobacco abuse 09/17/2015   Epiploic appendagitis 09/15/2015   Asthma 07/26/2004    Past Surgical History:  Procedure Laterality Date   DIAGNOSTIC LAPAROSCOPY WITH REMOVAL OF ECTOPIC PREGNANCY Right 12/19/2020   Procedure: DIAGNOSTIC LAPAROSCOPY WITH REMOVAL OF ECTOPIC PREGNANCY;  Surgeon: Rubie Maid, MD;  Location: [facility] ORS;  Service: Gynecology;  Laterality: Right;   ECTOPIC  PREGNANCY SURGERY     h/o 1 ectopic on each side    Prior to Admission medications   Medication Sig Start Date End Date Taking? Authorizing Provider  furosemide (LASIX) 20 MG tablet Take 1 tablet (20 mg total) by mouth daily for 5 days. 12/23/20 12/28/20 Yes Blake Divine, MD  albuterol (VENTOLIN HFA) 108 (90 Base) MCG/ACT inhaler Inhale into the lungs every 6 (six) hours as needed for wheezing or shortness of breath.    [provider]  ibuprofen (ADVIL) 800 MG tablet Take 1 tablet (800 mg total) by mouth every 8 (eight) hours as needed for mild pain or moderate pain. 12/19/20   Rubie Maid, MD  Multiple Vitamins-Minerals (ADULT ONE DAILY GUMMIES PO) Take 1 tablet by mouth in the morning.    [provider]  oxyCODONE-acetaminophen (PERCOCET) 5-325 MG tablet Take 1-2 tablets by mouth every 6 (six) hours as needed for severe pain. 12/19/20 12/19/21  Rubie Maid, MD  Prenatal MV & Min w/FA-DHA (PRENATAL GUMMIES PO) Take 1 tablet by mouth in the morning.    [provider]  Spacer/Aero Chamber Mouthpiece MISC 1 Units by Does not apply route every 4 (four) hours as needed (wheezing). 12/20/17   Darel Hong, MD    Allergies Amoxicillin, Ciprofloxacin, Penicillins, and Codeine  Family History  Problem Relation Age of Onset   Hypertension Mother    Diabetes Mother    Asthma Mother    Hypertension Father    Diabetes Father    Gout Father    Asthma Sister    Hypertension  Maternal Grandmother    Gout Paternal Grandmother    Alzheimer's disease Paternal Grandmother    Heart failure Paternal Grandmother     Social History Social History   Tobacco Use   Smoking status: Every Day    Packs/day: 0.50    Years: 10.00    Pack years: 5.00    Types: Cigarettes   Smokeless tobacco: Never  Substance Use Topics   Alcohol use: Yes    Comment: occ   Drug use: Yes    Types: Marijuana    Review of Systems  Constitutional: No fever/chills Eyes: No visual  changes. ENT: No sore throat. Cardiovascular: Denies chest pain. Respiratory: Denies shortness of breath. Gastrointestinal: Positive for abdominal pain.  No nausea, no vomiting.  No diarrhea.  No constipation. Genitourinary: Negative for dysuria.  Positive for vaginal bleeding. Musculoskeletal: Negative for back pain.  Positive for leg swelling. Skin: Negative for rash. Neurological: Negative for headaches, focal weakness or numbness.  ____________________________________________   PHYSICAL EXAM:  VITAL SIGNS: ED Triage Vitals  Enc Vitals Group     BP 12/23/20 1211 115/73     Pulse Rate 12/23/20 1211 75     Resp 12/23/20 1211 20     Temp 12/23/20 1211 99.2 F (37.3 C)     Temp Source 12/23/20 1211 Oral     SpO2 12/23/20 1211 98 %     Weight 12/23/20 1212 170 lb (77.1 kg)     Height 12/23/20 1212 4\' 11"  (1.499 m)     Head Circumference --      Peak Flow --      Pain Score 12/23/20 1212 6     Pain Loc --      Pain Edu? --      Excl. in Success? --     Constitutional: Alert and oriented. Eyes: Conjunctivae are normal. Head: Atraumatic. Nose: No congestion/rhinnorhea. Mouth/Throat: Mucous membranes are moist. Neck: Normal ROM Cardiovascular: Normal rate, regular rhythm. Grossly normal heart sounds.  2+ DP pulses bilaterally. Respiratory: Normal respiratory effort.  No retractions. Lungs CTAB. Gastrointestinal: Soft and mildly tender to palpation diffusely, laparoscopic surgical sites clean, dry, and intact. Genitourinary: deferred Musculoskeletal: No lower extremity tenderness, 1+ pitting edema to knees bilaterally.  No associated erythema or warmth. Neurologic:  Normal speech and language. No gross focal neurologic deficits are appreciated. Skin:  Skin is warm, dry and intact. No rash noted. Psychiatric: Mood and affect are normal. Speech and behavior are normal.  ____________________________________________   LABS (all labs ordered are listed, but only abnormal results  are displayed)  Labs Reviewed  CBC - Abnormal; Notable for the following components:      Result Value   RBC 3.79 (*)    Hemoglobin 11.1 (*)    HCT 33.8 (*)    RDW 15.8 (*)    All other components within normal limits  BASIC METABOLIC PANEL - Abnormal; Notable for the following components:   Calcium 8.2 (*)    All other components within normal limits    PROCEDURES  Procedure(s) performed (including Critical Care):  Procedures   ____________________________________________   INITIAL IMPRESSION / ASSESSMENT AND PLAN / ED COURSE      35 year old female with past medical history of asthma, PID, and ectopic pregnancy status post right salpingectomy 4 days ago who presents to the ED complaining of increasing swelling in her bilateral lower extremities, left greater than right, since her procedure.  She does have edema to her bilateral lower extremities but with no  associated calf tenderness or skin changes.  She is neurovascular intact to her bilateral lower extremities, we will further assess with ultrasound to rule out DVT, basic labs also pending.  Abdomen is mildly tender to palpation but not more tender than would be expected following her procedure.  Ultrasound is negative for DVT or other acute process.  Labs show slight drop in hemoglobin, not unexpected after her procedure, but are otherwise reassuring.  Patient is appropriate for discharge home with OB/GYN follow-up, she was counseled to use compression stockings and she is also requesting a short prescription for Lasix.  We will provide 5 days of Lasix and patient counseled to return to the ED for new worsening symptoms, patient agrees with plan.     ____________________________________________   FINAL CLINICAL IMPRESSION(S) / ED DIAGNOSES  Final diagnoses:  Leg swelling     ED Discharge Orders          Ordered    furosemide (LASIX) 20 MG tablet  Daily        12/23/20 1422             Note:  This  document was prepared using Dragon voice recognition software and may include unintentional dictation errors.    Blake Divine, MD 12/23/20 1423

## 2020-12-29 ENCOUNTER — Encounter: Payer: Self-pay | Admitting: Obstetrics and Gynecology

## 2020-12-29 ENCOUNTER — Ambulatory Visit (INDEPENDENT_AMBULATORY_CARE_PROVIDER_SITE_OTHER): Payer: Self-pay | Admitting: Obstetrics and Gynecology

## 2020-12-29 ENCOUNTER — Other Ambulatory Visit: Payer: Self-pay

## 2020-12-29 VITALS — BP 124/79 | HR 81 | Ht 59.0 in | Wt 161.7 lb

## 2020-12-29 DIAGNOSIS — Z9889 Other specified postprocedural states: Secondary | ICD-10-CM

## 2020-12-29 DIAGNOSIS — G8918 Other acute postprocedural pain: Secondary | ICD-10-CM

## 2020-12-29 DIAGNOSIS — Z09 Encounter for follow-up examination after completed treatment for conditions other than malignant neoplasm: Secondary | ICD-10-CM

## 2020-12-29 DIAGNOSIS — O00101 Right tubal pregnancy without intrauterine pregnancy: Secondary | ICD-10-CM

## 2020-12-29 DIAGNOSIS — D219 Benign neoplasm of connective and other soft tissue, unspecified: Secondary | ICD-10-CM

## 2020-12-29 NOTE — Patient Instructions (Signed)
Uterine Fibroids ?Uterine fibroids, also called leiomyomas, are noncancerous (benign) tumors that can grow in the uterus. They can cause heavy menstrual bleeding and pain. Fibroids may also grow in the fallopian tubes, cervix, or tissues (ligaments) near the uterus. ?You may have one or many fibroids. Fibroids vary in size, weight, and where they grow in the uterus. Some can become quite large. Most fibroids do not require medical treatment. ?What are the causes? ?The cause of this condition is not known. ?What increases the risk? ?You are more likely to develop this condition if you: ?Are in your 30s or 40s and have not gone through menopause. ?Have a family history of this condition. ?Are of African American descent. ?Started your menstrual period at age 10 or younger. ?Have never given birth. ?Are overweight or obese. ?What are the signs or symptoms? ?Many women do not have any symptoms. Symptoms of this condition may include: ?Heavy menstrual bleeding. ?Bleeding between menstrual periods. ?Pain and pressure in the pelvic area, between your hip bones. ?Pain during sex. ?Bladder problems, such as needing to urinate right away or more often than usual. ?Inability to have children (infertility). ?Failure to carry pregnancy to term (miscarriage). ?How is this diagnosed? ?This condition may be diagnosed based on: ?Your symptoms and medical history. ?A physical exam. ?A pelvic exam that includes feeling for any tumors. ?Imaging tests, such as ultrasound or MRI. ?How is this treated? ?Treatment for this condition may include follow-up visits with your health care provider to monitor your fibroids for any changes. Other treatment may include: ?Medicines, such as: ?Medicines to relieve pain, including aspirin and NSAIDs, such as ibuprofen or naproxen. ?Hormone therapy. Treatment may be given as a pill or an injection, or it may be inserted into the uterus using an intrauterine device (IUD). ?Surgery that would do one of  the following: ?Remove the fibroids (myomectomy). This may be recommended if fibroids affect your fertility and you want to become pregnant. ?Remove the uterus (hysterectomy). ?Block the blood supply to the fibroids (uterine artery embolization). This can cause them to shrink and die. ?Follow these instructions at home: ?Medicines ?Take over-the-counter and prescription medicines only as told by your health care provider. ?Ask your health care provider if you should take iron pills or eat more iron-rich foods, such as dark green, leafy vegetables. Heavy menstrual bleeding can cause low iron levels. ?Managing pain ?If directed, apply heat to your back or abdomen to reduce pain. Use the heat source that your health care provider recommends, such as a moist heat pack or a heating pad. To apply heat: ?Place a towel between your skin and the heat source. ?Leave the heat on for 20-30 minutes. ?Remove the heat if your skin turns bright red. This is especially important if you are unable to feel pain, heat, or cold. You may have a greater risk of getting burned. ? ?General instructions ?Pay close attention to your menstrual cycle. Tell your health care provider about any changes, such as: ?Heavier bleeding that requires you to change your pads or tampons more than usual. ?A change in the number of days that your menstrual period lasts. ?A change in symptoms that come with your menstrual period, such as back pain or cramps in your abdomen. ?Keep all follow-up visits. This is important, especially if your fibroids need to be monitored for any changes. ?Contact a health care provider if you: ?Have pelvic pain, back pain, or cramps in your abdomen that do not get better with medicine   or heat. ?Develop new bleeding between menstrual periods. ?Have increased bleeding during or between menstrual periods. ?Feel more tired or weak than usual. ?Feel light-headed. ?Get help right away if you: ?Faint. ?Have pelvic pain that suddenly  gets worse. ?Have severe vaginal bleeding that soaks a tampon or pad in 30 minutes or less. ?Summary ?Uterine fibroids are noncancerous (benign) tumors that can develop in the uterus. ?The exact cause of this condition is not known. ?Most fibroids do not require medical treatment unless they affect your ability to have children (fertility). ?Contact a health care provider if you have pelvic pain, back pain, or cramps in your abdomen that do not get better with medicines. ?Get help right away if you faint, have pelvic pain that suddenly gets worse, or have severe vaginal bleeding. ?This information is not intended to replace advice given to you by your health care provider. Make sure you discuss any questions you have with your health care provider. ?Document Revised: 01/27/2020 Document Reviewed: 01/27/2020 ?Elsevier Patient Education ? 2022 [employer] ? ?

## 2020-12-29 NOTE — Progress Notes (Signed)
    OBSTETRICS/GYNECOLOGY POST-OPERATIVE CLINIC VISIT  Subjective:     Nicole Gross is a 35 y.o. female who presents to the clinic  1.5  weeks status post  laparoscopic right salpingectomy  for  right ectopic pregnancy  (isthmic). Eating a regular diet without difficulty. Bowel movements are normal. Patient reports that she is experiencing pain on her right side, more intense when changing positions, especially in the morning when getting out of bed. Currently using Tylenol and Ibuprofen for pain. Also attempted to use a heating pad, but notes this made the pain worse.   Of note, patient was seen in the ER last week due to complaints of significant bilateral leg swelling.  She was ruled out for DVT, and was treated with PO Lasix for peripheral edema, and given a prescription to continue for several days.  Today she reports that swelling has essentially resolved.   The following portions of the patient's history were reviewed and updated as appropriate: allergies, current medications, past family history, past medical history, past social history, past surgical history, and problem list.  Review of Systems Pertinent items noted in HPI and remainder of comprehensive ROS otherwise negative.    Objective:    BP 124/79   Pulse 81   Ht 4\' 11"  (1.499 m)   Wt 161 lb 11.2 oz (73.3 kg)   BMI 32.66 kg/m  General:  alert and no distress  Abdomen: soft, bowel sounds active, non-tender  Incision:   healing well, no drainage, no erythema, no hernia, no seroma, no swelling, no dehiscence, incision well approximated. Mild tenderness on palpation approximately 2-3 cm above right incision site.  Old ruising noted around right incision site.    Pathology:  . FALLOPIAN TUBE, RIGHT; SALPINGECTOMY:  - ECTOPIC PREGNANCY.  - SALPINGITIS ISTHMICA NODOSA.   Assessment:   S/p laparoscopic surgery  Postoperative course complicated by incisional pain Fibroid uterus H/o ectopic pregnancy (now x 3)  Plan:    1. Continue any current medications as needed.   2. Wound care discussed.  Also discussed use of lidocaine patches and ice packs for pain over the next week.  3. Operative findings reviewed. Pathology report discussed.  Advised that with patient's h/o PID, recurrent miscarriages, and newly diagnosed fibroid uterus, she may want to consider alternative methods of conception (consider IVF or surrogacy). Discussed that she would likely require fibroid removal if IVF was planned).  4. Activity restrictions: none 5. Anticipated return to work:  in 3-5 days  if applicable.  Patient also desires a "medical clearance form" to be able to donate plasma.  Form completed today. 6. Follow up:  as needed. Notes that she does not have a PCP,  but utilizes the health department for routine health maintenance, due to lack of insurance.     Rubie Maid, MD [facility]

## 2020-12-29 NOTE — Progress Notes (Signed)
Pt present for follow up due to surgery pain. Pt stated that she was having right side sharp pain in the incision site.

## 2021-01-11 ENCOUNTER — Telehealth: Payer: Self-pay

## 2021-01-11 NOTE — Telephone Encounter (Signed)
TC from Tawanna Solo, RN who called patient for new OB intake. Per Suanne Marker, patient stated she had miscarried about 2 weeks ago and doesn't need her new OB appointment. Appointment for 01/12/21 canceled.Jenetta Downer, RN

## 2021-01-18 ENCOUNTER — Telehealth: Payer: Self-pay

## 2021-01-18 NOTE — Telephone Encounter (Signed)
Telephone call to complete OB abstraction on 01-11-21 and pt states "lost baby 2 weeks ago" and doesn't need appointment Informed Jenetta Downer RN  .Tonny Branch, RN

## 2021-05-22 ENCOUNTER — Emergency Department
Admission: EM | Admit: 2021-05-22 | Discharge: 2021-05-22 | Disposition: A | Discharge status: Home / Self Care | Payer: Medicaid Other | Attending: Emergency Medicine | Admitting: Emergency Medicine

## 2021-05-22 ENCOUNTER — Other Ambulatory Visit: Payer: Self-pay

## 2021-05-22 ENCOUNTER — Emergency Department: Payer: Medicaid Other

## 2021-05-22 DIAGNOSIS — F17210 Nicotine dependence, cigarettes, uncomplicated: Secondary | ICD-10-CM | POA: Diagnosis not present

## 2021-05-22 DIAGNOSIS — X509XXA Other and unspecified overexertion or strenuous movements or postures, initial encounter: Secondary | ICD-10-CM | POA: Insufficient documentation

## 2021-05-22 DIAGNOSIS — S6991XA Unspecified injury of right wrist, hand and finger(s), initial encounter: Secondary | ICD-10-CM | POA: Insufficient documentation

## 2021-05-22 DIAGNOSIS — J45909 Unspecified asthma, uncomplicated: Secondary | ICD-10-CM | POA: Insufficient documentation

## 2021-05-22 DIAGNOSIS — M79641 Pain in right hand: Secondary | ICD-10-CM

## 2021-05-22 DIAGNOSIS — M25531 Pain in right wrist: Secondary | ICD-10-CM

## 2021-05-22 NOTE — ED Triage Notes (Signed)
Pt states while she was being arrested on Friday her right pointer finger was injured and is having pain

## 2021-05-22 NOTE — Discharge Instructions (Signed)
Please take Tylenol and ibuprofen/Advil for your pain.  It is safe to take them together, or to alternate them every few hours.  Take up to 1000mg of Tylenol at a time, up to 4 times per day.  Do not take more than 4000 mg of Tylenol in 24 hours.  For ibuprofen, take 400-600 mg, 4-5 times per day. ° ° °

## 2021-05-22 NOTE — ED Provider Notes (Signed)
[facility] Emergency Department Provider Note ____________________________________________   Event Date/Time   First MD Initiated Contact with Patient 05/22/21 1275     (approximate)  I have reviewed the triage vital signs and the nursing notes.  HISTORY  Chief Complaint Hand Pain   HPI Nicole Gross is a 35 y.o. femalewho presents to the ED for evaluation of right hand pain.  Chart review indicates no relevant history.  Patient presents to the ED for evaluation of right hand pain for the past 2 days after she was arrested and handcuffed on Friday.  She is right-hand dominant.  She reports that she was "aggressively" arrested where they pulled on her right hand and wrist, causing injury.  She reports increasing soft tissue swelling and pain at this location since that time.  She has not taken any medications.  Reports moderate intensity aching pain without radiation.  Denies additional trauma, falls, injuries or head trauma.    Past Medical History:  Diagnosis Date   Asthma    Ectopic pregnancy    x 2   Pelvic inflammatory disease (PID)     Patient Active Problem List   Diagnosis Date Noted   Overweight (BMI 25.0-29.9) 04/03/2018   PID (acute pelvic inflammatory disease) 07/03/2016   Female pelvic inflammatory disease 09/17/2015   Leukocytosis 09/17/2015   History of gastrointestinal bleeding 09/17/2015   Anemia 09/17/2015   Pyuria 09/17/2015   Tobacco abuse 09/17/2015   Epiploic appendagitis 09/15/2015   Asthma 07/26/2004    Past Surgical History:  Procedure Laterality Date   DIAGNOSTIC LAPAROSCOPY WITH REMOVAL OF ECTOPIC PREGNANCY Right 12/19/2020   Procedure: DIAGNOSTIC LAPAROSCOPY WITH REMOVAL OF ECTOPIC PREGNANCY;  Surgeon: Rubie Maid, MD;  Location: [facility] ORS;  Service: Gynecology;  Laterality: Right;   ECTOPIC PREGNANCY SURGERY     h/o 1 ectopic on each side    Prior to Admission medications   Medication Sig Start Date End Date  Taking? Authorizing Provider  albuterol (VENTOLIN HFA) 108 (90 Base) MCG/ACT inhaler Inhale into the lungs every 6 (six) hours as needed for wheezing or shortness of breath.    [provider]  ibuprofen (ADVIL) 800 MG tablet Take 1 tablet (800 mg total) by mouth every 8 (eight) hours as needed for mild pain or moderate pain. 12/19/20   Rubie Maid, MD  Multiple Vitamins-Minerals (ADULT ONE DAILY GUMMIES PO) Take 1 tablet by mouth in the morning.    [provider]  Prenatal MV & Min w/FA-DHA (PRENATAL GUMMIES PO) Take 1 tablet by mouth in the morning. Patient not taking: Reported on 12/29/2020    [provider]  Spacer/Aero Chamber Mouthpiece MISC 1 Units by Does not apply route every 4 (four) hours as needed (wheezing). 12/20/17   Darel Hong, MD    Allergies Amoxicillin, Ciprofloxacin, Penicillins, and Codeine  Family History  Problem Relation Age of Onset   Hypertension Mother    Diabetes Mother    Asthma Mother    Hypertension Father    Diabetes Father    Gout Father    Asthma Sister    Hypertension Maternal Grandmother    Gout Paternal Grandmother    Alzheimer's disease Paternal Grandmother    Heart failure Paternal Grandmother     Social History Social History   Tobacco Use   Smoking status: Every Day    Packs/day: 0.50    Years: 10.00    Pack years: 5.00    Types: Cigarettes   Smokeless tobacco: Never  Substance Use Topics   Alcohol use: Not Currently    Comment: occ   Drug use: Not Currently    Types: Marijuana    Review of Systems  Constitutional: No fever/chills Eyes: No visual changes. ENT: No sore throat. Cardiovascular: Denies chest pain. Respiratory: Denies shortness of breath. Gastrointestinal: No abdominal pain.  No nausea, no vomiting.  No diarrhea.  No constipation. Genitourinary: Negative for dysuria. Musculoskeletal: Negative for back pain. Positive for right hand pain Skin: Negative for rash. Neurological:  Negative for headaches, focal weakness or numbness.  ____________________________________________   PHYSICAL EXAM:  VITAL SIGNS: Vitals:   05/22/21 1752  BP: 112/72  Pulse: 69  Resp: 18  Temp: 98.1 F (36.7 C)  SpO2: 100%    Constitutional: Alert and oriented. Well appearing and in no acute distress. Eyes: Conjunctivae are normal. PERRL. EOMI. Head: Atraumatic. Nose: No congestion/rhinnorhea. Mouth/Throat: Mucous membranes are moist.  Oropharynx non-erythematous. Neck: No stridor. No cervical spine tenderness to palpation. Cardiovascular: Normal rate, regular rhythm. Grossly normal heart sounds.  Good peripheral circulation. Respiratory: Normal respiratory effort.  No retractions. Lungs CTAB. Gastrointestinal: Soft , nondistended, nontender to palpation. No CVA tenderness. Musculoskeletal: No lower extremity tenderness nor edema.  No joint effusions.  Soft tissue swelling overlying the first and second metacarpals dorsally.  Tender to palpation.  No laceration or evidence of open injury.  Full range of motion to the hands and fingers with some pain.  Similarly, pain with range of the wrist but she has full range of motion.  Some tenderness to the radial aspect of the right wrist. Neurologic:  Normal speech and language. No gross focal neurologic deficits are appreciated. No gait instability noted. Skin:  Skin is warm, dry and intact. No rash noted. Psychiatric: Mood and affect are normal. Speech and behavior are normal.  ____________________________________________   LABS (all labs ordered are listed, but only abnormal results are displayed)  Labs Reviewed - No data to display ____________________________________________  12 Lead EKG   ____________________________________________  RADIOLOGY  ED MD interpretation: Plain films of right wrist and hand reviewed by me without evidence of fracture or dislocation  Official radiology report(s): DG Wrist Complete  Right  Result Date: 05/22/2021 CLINICAL DATA:  aggressive handcuffing and arresting, pain to radial wrist. eval FX EXAM: RIGHT WRIST - COMPLETE 3+ VIEW COMPARISON:  None. FINDINGS: There is no evidence of fracture or dislocation. Intact scaphoid. There is no evidence of arthropathy or other focal bone abnormality. Soft tissues are unremarkable. IMPRESSION: No fracture or subluxation of the right wrist. Electronically Signed   By: Keith Rake M.D.   On: 05/22/2021 18:35   DG Hand Complete Right  Result Date: 05/22/2021 CLINICAL DATA:  aggressive handcuffing and arresting, pain to radial wrist. eval fx EXAM: RIGHT HAND - COMPLETE 3+ VIEW COMPARISON:  None. FINDINGS: There is no evidence of fracture or dislocation. There is no evidence of arthropathy or other focal bone abnormality. Soft tissues are unremarkable. IMPRESSION: No fracture or dislocation of the right hand. Electronically Signed   By: Keith Rake M.D.   On: 05/22/2021 18:35    ____________________________________________   PROCEDURES and INTERVENTIONS  Procedure(s) performed (including Critical Care):  Procedures  Medications - No data to display  ____________________________________________   MDM / ED COURSE   35 year old female presents to the ED a couple days after right hand and wrist injury, without evidence of bony pathology, amenable to outpatient management.  Normal vitals.  Exam without evidence of pathology or trauma  beyond her right wrist and hand on the radial side.  Has some soft tissue swelling and tenderness diffusely without evidence of laceration, open injury or bony tenderness.  Plain films of the area without evidence of fracture or dislocation throughout the hand, wrist or fingers.  Suspect soft tissue strain and injury.  We will discharge with return precautions.     ____________________________________________   FINAL CLINICAL IMPRESSION(S) / ED DIAGNOSES  Final diagnoses:  Hand pain,  right  Wrist pain, right     ED Discharge Orders     None        Dior Dominik   Note:  This document was prepared using Dragon voice recognition software and may include unintentional dictation errors.    Vladimir Crofts, MD 05/22/21 [phone]

## 2021-05-22 NOTE — ED Notes (Signed)
Dc ppw provided. Pain relief discussed. Pt assisted off unit on foot. Verbal consent for dc given

## 2021-09-21 ENCOUNTER — Ambulatory Visit: Payer: Medicaid Other

## 2021-10-11 ENCOUNTER — Other Ambulatory Visit: Payer: Self-pay

## 2021-10-11 ENCOUNTER — Encounter: Payer: Self-pay | Admitting: Emergency Medicine

## 2021-10-11 ENCOUNTER — Emergency Department: Payer: Medicaid Other

## 2021-10-11 DIAGNOSIS — J45909 Unspecified asthma, uncomplicated: Secondary | ICD-10-CM | POA: Insufficient documentation

## 2021-10-11 DIAGNOSIS — R82998 Other abnormal findings in urine: Secondary | ICD-10-CM | POA: Diagnosis not present

## 2021-10-11 DIAGNOSIS — R1031 Right lower quadrant pain: Secondary | ICD-10-CM | POA: Diagnosis present

## 2021-10-11 DIAGNOSIS — D259 Leiomyoma of uterus, unspecified: Secondary | ICD-10-CM | POA: Diagnosis not present

## 2021-10-11 LAB — CBC
HCT: 38.9 % (ref 36.0–46.0)
Hemoglobin: 12.5 g/dL (ref 12.0–15.0)
MCH: 28.3 pg (ref 26.0–34.0)
MCHC: 32.1 g/dL (ref 30.0–36.0)
MCV: 88.2 fL (ref 80.0–100.0)
Platelets: 387 10*3/uL (ref 150–400)
RBC: 4.41 MIL/uL (ref 3.87–5.11)
RDW: 14 % (ref 11.5–15.5)
WBC: 8.7 10*3/uL (ref 4.0–10.5)
nRBC: 0 % (ref 0.0–0.2)

## 2021-10-11 LAB — URINALYSIS, ROUTINE W REFLEX MICROSCOPIC
Bilirubin Urine: NEGATIVE
Glucose, UA: NEGATIVE mg/dL
Ketones, ur: NEGATIVE mg/dL
Nitrite: NEGATIVE
Protein, ur: 30 mg/dL — AB
Specific Gravity, Urine: 1.025 (ref 1.005–1.030)
pH: 5 (ref 5.0–8.0)

## 2021-10-11 LAB — COMPREHENSIVE METABOLIC PANEL
ALT: 12 U/L (ref 0–44)
AST: 29 U/L (ref 15–41)
Albumin: 3.1 g/dL — ABNORMAL LOW (ref 3.5–5.0)
Alkaline Phosphatase: 71 U/L (ref 38–126)
Anion gap: 7 (ref 5–15)
BUN: 10 mg/dL (ref 6–20)
CO2: 25 mmol/L (ref 22–32)
Calcium: 8.3 mg/dL — ABNORMAL LOW (ref 8.9–10.3)
Chloride: 106 mmol/L (ref 98–111)
Creatinine, Ser: 0.81 mg/dL (ref 0.44–1.00)
GFR, Estimated: >60 mL/min (ref >60)
Glucose, Bld: 109 mg/dL — ABNORMAL HIGH (ref 70–99)
Potassium: 3.7 mmol/L (ref 3.5–5.1)
Sodium: 138 mmol/L (ref 135–145)
Total Bilirubin: 0.7 mg/dL (ref 0.3–1.2)
Total Protein: 5.9 g/dL — ABNORMAL LOW (ref 6.5–8.1)

## 2021-10-11 LAB — POC URINE PREG, ED: Preg Test, Ur: NEGATIVE

## 2021-10-11 LAB — LIPASE, BLOOD: Lipase: 29 U/L (ref 11–51)

## 2021-10-11 MED ORDER — KETOROLAC TROMETHAMINE 15 MG/ML IJ SOLN
15.0000 mg | Freq: Once | INTRAMUSCULAR | Status: AC
  Administered 2021-10-11: 15 mg via INTRAMUSCULAR
  Filled 2021-10-11: qty 1

## 2021-10-11 NOTE — ED Triage Notes (Signed)
Pt to ED from home c/o RLQ that started a couple days ago and not getting better.  States pain is sharp and achy radiating down to groin and right leg.  Denies n/v/d or urinary changes.  States menstrual cycle last week was smaller than normal, spotty, and dark blood and then came back on this week.  States hx of 3 ectopic pregnancies and last one was last year, states this pain feels similar.  Pain worse with eating and moving.  Pt A&Ox4, chest rise even and unlabored, skin WNL and in NAD at this time. ?

## 2021-10-12 ENCOUNTER — Emergency Department: Payer: Medicaid Other

## 2021-10-12 ENCOUNTER — Emergency Department
Admission: EM | Admit: 2021-10-12 | Discharge: 2021-10-12 | Disposition: A | Discharge status: Home / Self Care | Payer: Medicaid Other | Attending: Emergency Medicine | Admitting: Emergency Medicine

## 2021-10-12 DIAGNOSIS — R102 Pelvic and perineal pain: Secondary | ICD-10-CM

## 2021-10-12 DIAGNOSIS — R1031 Right lower quadrant pain: Secondary | ICD-10-CM

## 2021-10-12 DIAGNOSIS — D219 Benign neoplasm of connective and other soft tissue, unspecified: Secondary | ICD-10-CM

## 2021-10-12 LAB — HCG, QUANTITATIVE, PREGNANCY: hCG, Beta Chain, Quant, S: 1 m[IU]/mL (ref <5)

## 2021-10-12 MED ORDER — ONDANSETRON HCL 4 MG/2ML IJ SOLN
4.0000 mg | Freq: Once | INTRAMUSCULAR | Status: DC
  Filled 2021-10-12: qty 2

## 2021-10-12 MED ORDER — IOHEXOL 300 MG/ML  SOLN
100.0000 mL | Freq: Once | INTRAMUSCULAR | Status: AC | PRN
  Administered 2021-10-12: 100 mL via INTRAVENOUS

## 2021-10-12 MED ORDER — MORPHINE SULFATE (PF) 4 MG/ML IV SOLN
4.0000 mg | Freq: Once | INTRAVENOUS | Status: AC
  Administered 2021-10-12: 4 mg via INTRAVENOUS
  Filled 2021-10-12: qty 1

## 2021-10-12 MED ORDER — SODIUM CHLORIDE 0.9 % IV BOLUS
1000.0000 mL | Freq: Once | INTRAVENOUS | Status: AC
  Administered 2021-10-12: 1000 mL via INTRAVENOUS

## 2021-10-12 MED ORDER — HYDROCODONE-ACETAMINOPHEN 5-325 MG PO TABS: 1.0000 | ORAL_TABLET | Freq: Four times a day (QID) | ORAL | 0 refills | Status: DC | PRN

## 2021-10-12 NOTE — ED Notes (Signed)
Pt discharge information reviewed. Pt understands need for follow up care and when to return if symptoms worsen. All questions answered. Pt is alert and oriented with even and regular respirations. Pt is seen ambulating out of department with string steady gait.   

## 2021-10-12 NOTE — Discharge Instructions (Signed)
You may take Ibuprofen as needed for pain; Norco as needed for more severe pain.  Return to the ER for worsening symptoms, persistent vomiting, difficulty breathing or other concerns. ?

## 2021-10-12 NOTE — ED Provider Notes (Signed)
? ?[facility] ?Provider Note ? ? ? Event Date/Time  ? First MD Initiated Contact with Patient 10/12/21 0148   ?  (approximate) ? ? ?History  ? ?Abdominal Pain ? ? ?HPI ? ?Nicole Gross is a 36 y.o. female who presents to the ED from home with a chief complaint of right lower quadrant abdominal pain which began 2 to 3 days ago.  Reports constant sharp and achy pain radiating down to her right groin and leg.  States her last menstrual cycle was shorter and smaller than normal, spotty and returned this week.  History of 3 ectopic pregnancies and states this pain feels similarly.  Good appetite.  Pain exacerbated with eating and moving.  Denies fever, cough, chest pain, shortness of breath, nausea, vomiting, dysuria or diarrhea. ?  ? ? ?Past Medical History  ? ?Past Medical History:  ?Diagnosis Date  ? Asthma   ? Ectopic pregnancy   ? x 2  ? Pelvic inflammatory disease (PID)   ? ? ? ?Active Problem List  ? ?Patient Active Problem List  ? Diagnosis Date Noted  ? Overweight (BMI 25.0-29.9) 04/03/2018  ? PID (acute pelvic inflammatory disease) 07/03/2016  ? Female pelvic inflammatory disease 09/17/2015  ? Leukocytosis 09/17/2015  ? History of gastrointestinal bleeding 09/17/2015  ? Anemia 09/17/2015  ? Pyuria 09/17/2015  ? Tobacco abuse 09/17/2015  ? Epiploic appendagitis 09/15/2015  ? Asthma 07/26/2004  ? ? ? ?Past Surgical History  ? ?Past Surgical History:  ?Procedure Laterality Date  ? DIAGNOSTIC LAPAROSCOPY WITH REMOVAL OF ECTOPIC PREGNANCY Right 12/19/2020  ? Procedure: DIAGNOSTIC LAPAROSCOPY WITH REMOVAL OF ECTOPIC PREGNANCY;  Surgeon: Rubie Maid, MD;  Location: [facility] ORS;  Service: Gynecology;  Laterality: Right;  ? ECTOPIC PREGNANCY SURGERY    ? h/o 1 ectopic on each side  ? ? ? ?Home Medications  ? ?Prior to Admission medications   ?Medication Sig Start Date End Date Taking? Authorizing Provider  ?albuterol (VENTOLIN HFA) 108 (90 Base) MCG/ACT inhaler Inhale into the lungs every 6 (six)  hours as needed for wheezing or shortness of breath.    [provider]  ?ibuprofen (ADVIL) 800 MG tablet Take 1 tablet (800 mg total) by mouth every 8 (eight) hours as needed for mild pain or moderate pain. 12/19/20   Rubie Maid, MD  ?Multiple Vitamins-Minerals (ADULT ONE DAILY GUMMIES PO) Take 1 tablet by mouth in the morning.    [provider]  ?Prenatal MV & Min w/FA-DHA (PRENATAL GUMMIES PO) Take 1 tablet by mouth in the morning. ?Patient not taking: Reported on 12/29/2020    [provider]  ?Spacer/Aero Chamber Mouthpiece MISC 1 Units by Does not apply route every 4 (four) hours as needed (wheezing). 12/20/17   Darel Hong, MD  ? ? ? ?Allergies  ?Amoxicillin, Ciprofloxacin, Penicillins, and Codeine ? ? ?Family History  ? ?Family History  ?Problem Relation Age of Onset  ? Hypertension Mother   ? Diabetes Mother   ? Asthma Mother   ? Hypertension Father   ? Diabetes Father   ? Gout Father   ? Asthma Sister   ? Hypertension Maternal Grandmother   ? Gout Paternal Grandmother   ? Alzheimer's disease Paternal Grandmother   ? Heart failure Paternal Grandmother   ? ? ? ?Physical Exam  ?Triage Vital Signs: ?ED Triage Vitals  ?Enc Vitals Group  ?   BP 10/11/21 2056 116/77  ?   Pulse Rate 10/11/21 2056 98  ?   Resp 10/11/21  2056 18  ?   Temp 10/11/21 2056 98.8 ?F (37.1 ?C)  ?   Temp Source 10/11/21 2056 Oral  ?   SpO2 10/11/21 2056 97 %  ?   Weight 10/11/21 2105 165 lb (74.8 kg)  ?   Height 10/11/21 2105 '4\' 11"'$  (1.499 m)  ?   Head Circumference --   ?   Peak Flow --   ?   Pain Score 10/11/21 2105 8  ?   Pain Loc --   ?   Pain Edu? --   ?   Excl. in [place]? --   ? ? ?Updated Vital Signs: ?BP 115/72 (BP Location: Left Arm)   Pulse 69   Temp 98.9 ?F (37.2 ?C) (Oral)   Resp 18   Ht '4\' 11"'$  (1.499 m)   Wt 74.8 kg   LMP 10/06/2021   SpO2 99%   BMI 33.33 kg/m?  ? ? ?General: Awake, no distress.  ?CV:  RRR.  Good peripheral perfusion.  ?Resp:  Normal effort.  CTA B. ?Abd:  Mildly tender to  palpation right lower quadrant without rebound or guarding.  No distention.  ?Other:  No vesicles. ? ? ?ED Results / Procedures / Treatments  ?Labs ?(all labs ordered are listed, but only abnormal results are displayed) ?Labs Reviewed  ?COMPREHENSIVE METABOLIC PANEL - Abnormal; Notable for the following components:  ?    Result Value  ? Glucose, Bld 109 (*)   ? Calcium 8.3 (*)   ? Total Protein 5.9 (*)   ? Albumin 3.1 (*)   ? All other components within normal limits  ?URINALYSIS, ROUTINE W REFLEX MICROSCOPIC - Abnormal; Notable for the following components:  ? Color, Urine YELLOW (*)   ? APPearance HAZY (*)   ? Hgb urine dipstick LARGE (*)   ? Protein, ur 30 (*)   ? Leukocytes,Ua SMALL (*)   ? Bacteria, UA FEW (*)   ? All other components within normal limits  ?LIPASE, BLOOD  ?CBC  ?HCG, QUANTITATIVE, PREGNANCY  ?POC URINE PREG, ED  ?POC URINE PREG, ED  ? ? ? ?EKG ? ?None ? ? ?RADIOLOGY ?I have independently visualized and interpreted patient's pelvic ultrasound, CT abdomen pelvis as well as noted the radiology interpretation: ? ?Pelvic ultrasound: Uterine fibroids ? ?CT abdomen pelvis: Normal appendix, uterine fibroids ? ?Official radiology report(s): ?CT Abdomen Pelvis W Contrast ? ?Result Date: 10/12/2021 ?CLINICAL DATA:  Right lower quadrant pain EXAM: CT ABDOMEN AND PELVIS WITH CONTRAST TECHNIQUE: Multidetector CT imaging of the abdomen and pelvis was performed using the standard protocol following bolus administration of intravenous contrast. RADIATION DOSE REDUCTION: This exam was performed according to the departmental dose-optimization program which includes automated exposure control, adjustment of the mA and/or kV according to patient size and/or use of iterative reconstruction technique. CONTRAST:  137m OMNIPAQUE IOHEXOL 300 MG/ML  SOLN COMPARISON:  01/15/2020 FINDINGS: Lower chest: No acute abnormality Hepatobiliary: Multiple enhancing hemangiomas again seen in the liver. No suspicious focal hepatic  abnormality. Gallbladder unremarkable. Pancreas: No focal abnormality or ductal dilatation. Spleen: No focal abnormality.  Normal size. Adrenals/Urinary Tract: No adrenal abnormality. No focal renal abnormality. No stones or hydronephrosis. Urinary bladder is unremarkable. Stomach/Bowel: Normal appendix. Stomach, large and small bowel grossly unremarkable. Vascular/Lymphatic: No evidence of aneurysm or adenopathy. Reproductive: Numerous fibroids in the uterus. Largest fibroid is seen posteriorly measuring 8.5 cm on sagittal image 55. No adnexal mass. Other: No free fluid or free air. Musculoskeletal: No acute bony abnormality. IMPRESSION: No acute  findings.  Normal appendix. Fibroid uterus. Electronically Signed   By: Rolm Baptise M.D.   On: 10/12/2021 03:15  ? ?US PELVIC COMPLETE W TRANSVAGINAL AND TORSION R/O ? ?Result Date: 10/11/2021 ?CLINICAL DATA:  Right lower quadrant pain EXAM: TRANSABDOMINAL AND TRANSVAGINAL ULTRASOUND OF PELVIS DOPPLER ULTRASOUND OF OVARIES TECHNIQUE: Both transabdominal and transvaginal ultrasound examinations of the pelvis were performed. Transabdominal technique was performed for global imaging of the pelvis including uterus, ovaries, adnexal regions, and pelvic cul-de-sac. It was necessary to proceed with endovaginal exam following the transabdominal exam to visualize the uterus, endometrium, ovaries and adnexa. Color and duplex Doppler ultrasound was utilized to evaluate blood flow to the ovaries. COMPARISON:  Ultrasound 03/08/2020 FINDINGS: Uterus Measurements: 9.7 x 7.3 x 5.8 cm = volume: 214 mL. Multiple fibroids. The largest is noted posteriorly measuring 7.6 cm. At least 2 fundal fibroids measuring up to 2.8 cm and 2.7 cm. Endometrium Thickness: 10 mm in thickness.  No focal abnormality visualized. Right ovary Measurements: 3.6 x 1.8 x 1.3 cm = volume: 4.1 mL. Normal appearance/no adnexal mass. Left ovary Measurements: 2.6 x 1.7 x 2.7 cm = volume: 6.1 mL. Normal appearance/no  adnexal mass. Pulsed Doppler evaluation of both ovaries demonstrates normal low-resistance arterial and venous waveforms. Other findings No abnormal free fluid. IMPRESSION: Multiple uterine fibroids. The largest posterio

## 2021-10-13 ENCOUNTER — Other Ambulatory Visit: Payer: Self-pay

## 2021-10-13 ENCOUNTER — Emergency Department: Payer: Medicaid Other

## 2021-10-13 ENCOUNTER — Emergency Department
Admission: EM | Admit: 2021-10-13 | Discharge: 2021-10-13 | Disposition: A | Discharge status: Home / Self Care | Payer: Medicaid Other | Attending: Student in an Organized Health Care Education/Training Program | Admitting: Student in an Organized Health Care Education/Training Program

## 2021-10-13 DIAGNOSIS — N939 Abnormal uterine and vaginal bleeding, unspecified: Secondary | ICD-10-CM | POA: Diagnosis present

## 2021-10-13 DIAGNOSIS — D72829 Elevated white blood cell count, unspecified: Secondary | ICD-10-CM | POA: Diagnosis not present

## 2021-10-13 DIAGNOSIS — D259 Leiomyoma of uterus, unspecified: Secondary | ICD-10-CM | POA: Diagnosis not present

## 2021-10-13 DIAGNOSIS — R102 Pelvic and perineal pain: Secondary | ICD-10-CM

## 2021-10-13 LAB — CBC WITH DIFFERENTIAL/PLATELET
Abs Immature Granulocytes: 0.04 10*3/uL (ref 0.00–0.07)
Basophils Absolute: 0.1 10*3/uL (ref 0.0–0.1)
Basophils Relative: 0 %
Eosinophils Absolute: 0.6 10*3/uL — ABNORMAL HIGH (ref 0.0–0.5)
Eosinophils Relative: 5 %
HCT: 39.7 % (ref 36.0–46.0)
Hemoglobin: 12.8 g/dL (ref 12.0–15.0)
Immature Granulocytes: 0 %
Lymphocytes Relative: 21 %
Lymphs Abs: 2.4 10*3/uL (ref 0.7–4.0)
MCH: 28.3 pg (ref 26.0–34.0)
MCHC: 32.2 g/dL (ref 30.0–36.0)
MCV: 87.6 fL (ref 80.0–100.0)
Monocytes Absolute: 0.8 10*3/uL (ref 0.1–1.0)
Monocytes Relative: 7 %
Neutro Abs: 7.6 10*3/uL (ref 1.7–7.7)
Neutrophils Relative %: 67 %
Platelets: 360 10*3/uL (ref 150–400)
RBC: 4.53 MIL/uL (ref 3.87–5.11)
RDW: 14.3 % (ref 11.5–15.5)
WBC: 11.4 10*3/uL — ABNORMAL HIGH (ref 4.0–10.5)
nRBC: 0 % (ref 0.0–0.2)

## 2021-10-13 LAB — BASIC METABOLIC PANEL
Anion gap: 7 (ref 5–15)
BUN: <5 mg/dL — ABNORMAL LOW (ref 6–20)
CO2: 24 mmol/L (ref 22–32)
Calcium: 8.3 mg/dL — ABNORMAL LOW (ref 8.9–10.3)
Chloride: 109 mmol/L (ref 98–111)
Creatinine, Ser: 0.77 mg/dL (ref 0.44–1.00)
GFR, Estimated: >60 mL/min (ref >60)
Glucose, Bld: 79 mg/dL (ref 70–99)
Potassium: 3.6 mmol/L (ref 3.5–5.1)
Sodium: 140 mmol/L (ref 135–145)

## 2021-10-13 LAB — WET PREP, GENITAL
Clue Cells Wet Prep HPF POC: NONE SEEN
Sperm: NONE SEEN
Trich, Wet Prep: NONE SEEN
WBC, Wet Prep HPF POC: <10 (ref <10)
Yeast Wet Prep HPF POC: NONE SEEN

## 2021-10-13 LAB — CHLAMYDIA/NGC RT PCR (ARMC ONLY)
Chlamydia Tr: NOT DETECTED
N gonorrhoeae: NOT DETECTED

## 2021-10-13 LAB — POC URINE PREG, ED: Preg Test, Ur: NEGATIVE

## 2021-10-13 LAB — HCG, QUANTITATIVE, PREGNANCY: hCG, Beta Chain, Quant, S: <1 m[IU]/mL (ref <5)

## 2021-10-13 MED ORDER — MEDROXYPROGESTERONE ACETATE 10 MG PO TABS: 10.0000 mg | ORAL_TABLET | Freq: Every day | ORAL | 0 refills | Status: DC

## 2021-10-13 MED ORDER — ONDANSETRON 4 MG PO TBDP: 4.0000 mg | ORAL_TABLET | Freq: Three times a day (TID) | ORAL | 0 refills | Status: DC | PRN

## 2021-10-13 MED ORDER — KETOROLAC TROMETHAMINE 30 MG/ML IJ SOLN
15.0000 mg | Freq: Once | INTRAMUSCULAR | Status: AC
  Administered 2021-10-13: 15 mg via INTRAVENOUS
  Filled 2021-10-13: qty 1

## 2021-10-13 NOTE — ED Triage Notes (Signed)
Pt with suprapubic pain since last week, has a hx of fibroids. Pt states vaginal bleeding started last Thursday, 2 days after last menstrual cycle stopped. Pt states today the bleeding has increased today so she has had to change pads every 10 minutes.  ?

## 2021-10-13 NOTE — ED Notes (Signed)
Pt unable to give urine sample at this time 

## 2021-10-13 NOTE — ED Provider Notes (Signed)
? ?[facility] ?Provider Note ? ? ? Event Date/Time  ? First MD Initiated Contact with Patient 10/13/21 1620   ?  (approximate) ? ? ?History  ? ?Vaginal Bleeding ? ? ?HPI ? ?Nicole Gross is a 36 y.o. female presents the ER for evaluation of suprapubic discomfort as well as vaginal bleeding worsened this morning.  States that she is saturating any pads per hour.  Daily diagnosis of uterine fibroids not on OCP.  States her pregnancy test was negative.  Took some pain medication that was prescribed without improvement so came back to the ER for reevaluation. ?  ? ? ?Physical Exam  ? ?Triage Vital Signs: ?ED Triage Vitals  ?Enc Vitals Group  ?   BP 10/13/21 1608 136/87  ?   Pulse Rate 10/13/21 1608 90  ?   Resp 10/13/21 1608 20  ?   Temp 10/13/21 1608 98.3 ?F (36.8 ?C)  ?   Temp Source 10/13/21 1608 Oral  ?   SpO2 10/13/21 1608 97 %  ?   Weight 10/13/21 1609 165 lb (74.8 kg)  ?   Height 10/13/21 1609 '4\' 11"'$  (1.499 m)  ?   Head Circumference --   ?   Peak Flow --   ?   Pain Score 10/13/21 1609 10  ?   Pain Loc --   ?   Pain Edu? --   ?   Excl. in [place]? --   ? ? ?Most recent vital signs: ?Vitals:  ? 10/13/21 1608  ?BP: 136/87  ?Pulse: 90  ?Resp: 20  ?Temp: 98.3 ?F (36.8 ?C)  ?SpO2: 97%  ? ? ? ?Constitutional: Alert  ?Eyes: Conjunctivae are normal.  ?Head: Atraumatic. ?Nose: No congestion/rhinnorhea. ?Mouth/Throat: Mucous membranes are moist.   ?Neck: Painless ROM.  ?Cardiovascular:   Good peripheral circulation. ?Respiratory: Normal respiratory effort.  No retractions.  ?Gastrointestinal: Soft and nontender in all four quadrants no guarding or rebound.  Pelvic exam with no significant cervical motion tenderness mild amount of bleeding there is some mild tenderness in the right side but no fullness ?Musculoskeletal:  no deformity ?Neurologic:  MAE spontaneously. No gross focal neurologic deficits are appreciated.  ?Skin:  Skin is warm, dry and intact. No rash noted. ?Psychiatric: Mood and affect are  normal. Speech and behavior are normal. ? ? ? ?ED Results / Procedures / Treatments  ? ?Labs ?(all labs ordered are listed, but only abnormal results are displayed) ?Labs Reviewed  ?CBC WITH DIFFERENTIAL/PLATELET - Abnormal; Notable for the following components:  ?    Result Value  ? WBC 11.4 (*)   ? Eosinophils Absolute 0.6 (*)   ? All other components within normal limits  ?BASIC METABOLIC PANEL - Abnormal; Notable for the following components:  ? BUN <5 (*)   ? Calcium 8.3 (*)   ? All other components within normal limits  ?WET PREP, GENITAL  ?CHLAMYDIA/NGC RT PCR ([facility] ONLY)            ?HCG, QUANTITATIVE, PREGNANCY  ?POC URINE PREG, ED  ? ? ? ?EKG ? ? ? ? ?RADIOLOGY ?Please see ED Course for my review and interpretation. ? ?I personally reviewed all radiographic images ordered to evaluate for the above acute complaints and reviewed radiology reports and findings.  These findings were personally discussed with the patient.  Please see medical record for radiology report. ? ? ? ?PROCEDURES: ? ?Critical Care performed:  ? ?Procedures ? ? ?MEDICATIONS ORDERED IN ED: ?Medications  ?ketorolac (TORADOL) 30  MG/ML injection 15 mg (15 mg Intravenous Given 10/13/21 1701)  ? ? ? ?IMPRESSION / MDM / ASSESSMENT AND PLAN / ED COURSE  ?I reviewed the triage vital signs and the nursing notes. ?             ?               ? ?Differential diagnosis includes, but is not limited to, AUB, DU B, ectopic, torsion, TOA, PID, cyst, uterine fibroids, appendicitis, colitis ? ?Patient presenting with symptoms as described above.  Having been seen for similar symptoms in the past few days.  Now presenting with complaint of worsening vaginal bleeding that she feels very heavy.  Not any blood thinners.  No fevers.  Her exam does not seem consistent with acute sinusitis do not feel that repeat CT imaging clinically indicated will perform pelvic exam as her primary concern is vaginal bleeding.  Was previously on birth control.  Denies any trauma  has not had sexual intercourse in the past 24 to 48 hours of lower suspicion for trauma or laceration. ? ? ?Clinical Course as of 10/13/21 1841  ?Thu Oct 13, 2021  ?1655 Mild leukocytosis.  Hemoglobin stable.  Electrolytes and renal function normal.  Beta quant is negative. [PR]  ?1718 Pelvic exam performed with RN chaperone shows mild bleeding in the vaginal vault.  No heavy bleeding.  Does have some right adnexal tenderness, no laceration no mass.  Given lack of fever no guarding or rebound on exam with normal-appearing appendix yesterday and her primary complaint being vaginal bleeding and pelvic pain today have a lower suspicion for appendicitis will order ultrasound to rule out torsion. [PR]  ?1840 Ultrasound shows fibroid uterus but normal-appearing ovaries.  Repeat exam shows a soft benign abdomen.  This point do not believe that repeat CT imaging is clinically indicated.  Given her vaginal bleeding patient would like to try course of medroxyprogesterone.  She has follow-up with OB/GYN.  We discussed strict return precautions.  Patient agreeable to plan. [PR]  ?  ?Clinical Course User Index ?[PR] Merlyn Lot, MD  ? ? ? ?FINAL CLINICAL IMPRESSION(S) / ED DIAGNOSES  ? ?Final diagnoses:  ?Vaginal bleeding  ?Pelvic pain  ?Uterine leiomyoma, unspecified location  ? ? ? ?Rx / DC Orders  ? ?ED Discharge Orders   ? ?      Ordered  ?  medroxyPROGESTERone (PROVERA) 10 MG tablet  Daily       ? 10/13/21 1838  ?  ondansetron (ZOFRAN-ODT) 4 MG disintegrating tablet  Every 8 hours PRN       ? 10/13/21 1838  ? ?  ?  ? ?  ? ? ? ?Note:  This document was prepared using Dragon voice recognition software and may include unintentional dictation errors. ? ?  ?Merlyn Lot, MD ?10/13/21 1841 ? ?

## 2021-10-13 NOTE — ED Notes (Signed)
AVS with prescriptions provided to and discussed with patient and family member at bedside. Pt verbalizes understanding of discharge instructions and denies any questions or concerns at this time. Pt has ride home. Pt taken out of department via W/C.  ? ?

## 2021-10-26 ENCOUNTER — Encounter: Payer: Self-pay | Admitting: Obstetrics and Gynecology

## 2021-11-08 ENCOUNTER — Encounter: Payer: Self-pay | Admitting: Obstetrics and Gynecology

## 2021-11-08 NOTE — Progress Notes (Deleted)
    GYNECOLOGY PROGRESS NOTE  Subjective:    Patient ID: Nicole Gross, female    DOB: 19-Mar-1986, 36 y.o.   MRN: [id number]  HPI  Patient is a 36 y.o. G36P0050 female who presents for follow up from ED for Fibroids.  {Common ambulatory SmartLinks:19316}  Review of Systems {ros; complete:30496}   Objective:   unknown if currently breastfeeding. There is no height or weight on file to calculate BMI. General appearance: {general exam:16600} Abdomen: {abdominal exam:16834} Pelvic: {pelvic exam:16852::"cervix normal in appearance","external genitalia normal","no adnexal masses or tenderness","no cervical motion tenderness","rectovaginal septum normal","uterus normal size, shape, and consistency","vagina normal without discharge"} Extremities: {extremity exam:5109} Neurologic: {neuro exam:17854}   Assessment:   No diagnosis found.   Plan:   There are no diagnoses linked to this encounter.    Rubie Maid, MD [facility]

## 2021-12-01 ENCOUNTER — Encounter: Payer: Self-pay | Admitting: Advanced Practice Midwife

## 2021-12-01 ENCOUNTER — Ambulatory Visit: Payer: Medicaid Other | Admitting: Advanced Practice Midwife

## 2021-12-01 DIAGNOSIS — T7412XA Child physical abuse, confirmed, initial encounter: Secondary | ICD-10-CM | POA: Insufficient documentation

## 2021-12-01 DIAGNOSIS — A599 Trichomoniasis, unspecified: Secondary | ICD-10-CM

## 2021-12-01 DIAGNOSIS — T7412XS Child physical abuse, confirmed, sequela: Secondary | ICD-10-CM

## 2021-12-01 DIAGNOSIS — F1290 Cannabis use, unspecified, uncomplicated: Secondary | ICD-10-CM | POA: Insufficient documentation

## 2021-12-01 DIAGNOSIS — Z113 Encounter for screening for infections with a predominantly sexual mode of transmission: Secondary | ICD-10-CM | POA: Diagnosis not present

## 2021-12-01 DIAGNOSIS — Z9079 Acquired absence of other genital organ(s): Secondary | ICD-10-CM | POA: Insufficient documentation

## 2021-12-01 LAB — HM HEPATITIS C SCREENING LAB: HM Hepatitis Screen: NEGATIVE

## 2021-12-01 LAB — WET PREP FOR TRICH, YEAST, CLUE
Trichomonas Exam: POSITIVE — AB
Yeast Exam: NEGATIVE

## 2021-12-01 LAB — HM HIV SCREENING LAB: HM HIV Screening: NEGATIVE

## 2021-12-01 MED ORDER — METRONIDAZOLE 500 MG PO TABS: 500.0000 mg | ORAL_TABLET | Freq: Two times a day (BID) | ORAL | 0 refills | Status: AC

## 2021-12-01 NOTE — Progress Notes (Signed)
Patient here for STD screening. Wet prep reviewed, patient positive for trich. Medication dispensed. Condoms given. Contact card given.

## 2021-12-01 NOTE — Progress Notes (Signed)
[facility] Department  STI clinic/screening visit [place] [address] [phone]  Subjective:  Nicole Gross is a 36 y.o. SBF G8P0 smoker female being seen today for an STI screening visit. The patient reports they do have symptoms.  Patient reports that they do not desire a pregnancy in the next year.   They reported they are not interested in discussing contraception today.    No LMP recorded. (Menstrual status: Other).   Patient has the following medical conditions:   Patient Active Problem List   Diagnosis Date Noted   H/O unilateral salpingectomy right 12/19/20 12/01/2021   Overweight (BMI 25.0-29.9) 04/03/2018   PID (acute pelvic inflammatory disease) 07/03/2016   Leukocytosis 09/17/2015   History of gastrointestinal bleeding 09/17/2015   Anemia 09/17/2015   Pyuria 09/17/2015   Tobacco abuse 09/17/2015   Epiploic appendagitis 09/15/2015   Asthma 07/26/2004    Chief Complaint  Patient presents with   SEXUALLY TRANSMITTED DISEASE    STI screening. Endorses vaginal itching.    HPI  Patient reports internal vaginal itching x 3 days. LMP 10/15/21. Last sex 11/25/21 without condom; with current partner x 5 years; 1 sex partner in last 3 mo. Last cig today. Last cigar 4 days ago. Last MJ 2 days ago. Last ETOH 11/24/21 (3 beers) "occasionally". Right ectopic with salpingectomy 12/19/20.   Last HIV test per patient/review of record was 03/08/20 Patient reports last pap was 04/03/18 neg HPV neg  Screening for MPX risk: Does the patient have an unexplained rash? No Is the patient MSM? No Does the patient endorse multiple sex partners or anonymous sex partners? No Did the patient have close or sexual contact with a person diagnosed with MPX? No Has the patient traveled outside the [place] where MPX is endemic? No Is there a high clinical suspicion for MPX-- evidenced by one of the following No  -Unlikely to be chickenpox  -Lymphadenopathy  -Rash  that present in same phase of evolution on any given body part See flowsheet for further details and programmatic requirements.   Immunization history:  Immunization History  Administered Date(s) Administered   Hep A / Hep B 02/07/2006, 06/15/2008, 10/02/2011   Tdap 02/07/2006     The following portions of the patient's history were reviewed and updated as appropriate: allergies, current medications, past medical history, past social history, past surgical history and problem list.  Objective:  There were no vitals filed for this visit.  Physical Exam Vitals and nursing note reviewed.  Constitutional:      Appearance: Normal appearance. She is obese.  HENT:     Head: Normocephalic and atraumatic.     Mouth/Throat:     Mouth: Mucous membranes are moist.     Pharynx: Oropharynx is clear. No oropharyngeal exudate or posterior oropharyngeal erythema.  Eyes:     Conjunctiva/sclera: Conjunctivae normal.  Pulmonary:     Effort: Pulmonary effort is normal.  Abdominal:     Palpations: Abdomen is soft. There is no mass.     Tenderness: There is no abdominal tenderness. There is no rebound.     Comments: Poor tone, increased adipose, tender to deep palpation lower abdomen pt states due to 3 fibroids size of baseball and needs surgery  Genitourinary:    General: Normal vulva.     Exam position: Lithotomy position.     Pubic Area: No rash or pubic lice.      Labia:        Right: No  rash or lesion.        Left: No rash or lesion.      Vagina: Vaginal discharge (no visible leukorrhea, ph<4.5) present. No erythema, bleeding or lesions.     Cervix: Normal.     Uterus: Normal.      Adnexa: Right adnexa normal and left adnexa normal.     Rectum: Normal.  Lymphadenopathy:     Head:     Right side of head: No preauricular or posterior auricular adenopathy.     Left side of head: No preauricular or posterior auricular adenopathy.     Cervical: No cervical adenopathy.     Right cervical: No  superficial, deep or posterior cervical adenopathy.    Left cervical: No superficial, deep or posterior cervical adenopathy.     Upper Body:     Right upper body: No supraclavicular or axillary adenopathy.     Left upper body: No supraclavicular or axillary adenopathy.     Lower Body: No right inguinal adenopathy. No left inguinal adenopathy.  Skin:    General: Skin is warm and dry.     Findings: No rash.  Neurological:     Mental Status: She is alert and oriented to person, place, and time.     Assessment and Plan:  Nicole Gross is a 36 y.o. female presenting to the [facility] Department for STI screening  1. Screening examination for venereal disease Treat wet mount per standing orders Immunization nurse consult - Gonococcus culture - Chlamydia/Gonorrhea [place] [facility] - Syphilis Serology, [place] [facility] - HIV/HCV [employer], YEAST, CLUE  2. H/O unilateral salpingectomy right 12/19/20 3. Fibroids per pt x3     No follow-ups on file.  No future appointments.  Herbie Saxon, CNM

## 2021-12-05 LAB — GONOCOCCUS CULTURE

## 2021-12-22 ENCOUNTER — Emergency Department
Admission: EM | Admit: 2021-12-22 | Discharge: 2021-12-22 | Disposition: A | Discharge status: Home / Self Care | Payer: Medicaid Other | Attending: Emergency Medicine | Admitting: Emergency Medicine

## 2021-12-22 ENCOUNTER — Emergency Department: Payer: Medicaid Other

## 2021-12-22 ENCOUNTER — Encounter: Payer: Self-pay | Admitting: Emergency Medicine

## 2021-12-22 ENCOUNTER — Other Ambulatory Visit: Payer: Self-pay

## 2021-12-22 DIAGNOSIS — J45901 Unspecified asthma with (acute) exacerbation: Secondary | ICD-10-CM | POA: Diagnosis not present

## 2021-12-22 DIAGNOSIS — R079 Chest pain, unspecified: Secondary | ICD-10-CM | POA: Diagnosis present

## 2021-12-22 LAB — BASIC METABOLIC PANEL
Anion gap: 5 (ref 5–15)
BUN: 12 mg/dL (ref 6–20)
CO2: 25 mmol/L (ref 22–32)
Calcium: 8.9 mg/dL (ref 8.9–10.3)
Chloride: 114 mmol/L — ABNORMAL HIGH (ref 98–111)
Creatinine, Ser: 0.92 mg/dL (ref 0.44–1.00)
GFR, Estimated: >60 mL/min (ref >60)
Glucose, Bld: 91 mg/dL (ref 70–99)
Potassium: 3.4 mmol/L — ABNORMAL LOW (ref 3.5–5.1)
Sodium: 144 mmol/L (ref 135–145)

## 2021-12-22 LAB — CBC
HCT: 38.2 % (ref 36.0–46.0)
Hemoglobin: 12 g/dL (ref 12.0–15.0)
MCH: 27.7 pg (ref 26.0–34.0)
MCHC: 31.4 g/dL (ref 30.0–36.0)
MCV: 88.2 fL (ref 80.0–100.0)
Platelets: 328 10*3/uL (ref 150–400)
RBC: 4.33 MIL/uL (ref 3.87–5.11)
RDW: 14.9 % (ref 11.5–15.5)
WBC: 14.8 10*3/uL — ABNORMAL HIGH (ref 4.0–10.5)
nRBC: 0 % (ref 0.0–0.2)

## 2021-12-22 LAB — TROPONIN I (HIGH SENSITIVITY): Troponin I (High Sensitivity): 3 ng/L (ref <18)

## 2021-12-22 MED ORDER — IPRATROPIUM-ALBUTEROL 0.5-2.5 (3) MG/3ML IN SOLN: 3.0000 mL | Freq: Four times a day (QID) | RESPIRATORY_TRACT | 1 refills | Status: DC | PRN

## 2021-12-22 MED ORDER — IPRATROPIUM-ALBUTEROL 0.5-2.5 (3) MG/3ML IN SOLN
3.0000 mL | Freq: Once | RESPIRATORY_TRACT | Status: AC
  Administered 2021-12-22: 3 mL via RESPIRATORY_TRACT
  Filled 2021-12-22: qty 3

## 2021-12-22 MED ORDER — IPRATROPIUM-ALBUTEROL 0.5-2.5 (3) MG/3ML IN SOLN
3.0000 mL | Freq: Once | RESPIRATORY_TRACT | Status: AC
  Administered 2021-12-22: 3 mL via RESPIRATORY_TRACT

## 2021-12-22 MED ORDER — IPRATROPIUM-ALBUTEROL 0.5-2.5 (3) MG/3ML IN SOLN
RESPIRATORY_TRACT | Status: AC
  Filled 2021-12-22: qty 3

## 2021-12-22 NOTE — ED Provider Notes (Addendum)
[facility] Provider Note    Event Date/Time   First MD Initiated Contact with Patient 12/22/21 1431     (approximate)   History   Chest Pain   HPI  Nicole Gross is a 36 y.o. female with a history of ectopic pregnancy, PID, asthma who presents with complaints of chest tightness.  Patient reports she feels that her asthma is flared up.  She was recently started on prednisone and doxycycline by urgent care.  Has used an inhaler as well but feels that it is not working.  Denies fevers or chills.  Does have dry cough.     Physical Exam   Triage Vital Signs: ED Triage Vitals  Enc Vitals Group     BP 12/22/21 1417 126/82     Pulse Rate 12/22/21 1417 99     Resp 12/22/21 1417 18     Temp 12/22/21 1417 98.8 F (37.1 C)     Temp Source 12/22/21 1417 Oral     SpO2 12/22/21 1417 97 %     Weight 12/22/21 1418 74.8 kg (164 lb 14.5 oz)     Height 12/22/21 1418 1.499 m ('4\' 11"'$ )     Head Circumference --      Peak Flow --      Pain Score 12/22/21 1417 10     Pain Loc --      Pain Edu? --      Excl. in [place]? --     Most recent vital signs: Vitals:   12/22/21 1417  BP: 126/82  Pulse: 99  Resp: 18  Temp: 98.8 F (37.1 C)  SpO2: 97%     General: Awake, no distress.  CV:  Good peripheral perfusion.  Resp:  Normal effort.  Scattered wheezing Abd:  No distention.  Other:     ED Results / Procedures / Treatments   Labs (all labs ordered are listed, but only abnormal results are displayed) Labs Reviewed  BASIC METABOLIC PANEL - Abnormal; Notable for the following components:      Result Value   Potassium 3.4 (*)    Chloride 114 (*)    All other components within normal limits  CBC - Abnormal; Notable for the following components:   WBC 14.8 (*)    All other components within normal limits  POC URINE PREG, ED  TROPONIN I (HIGH SENSITIVITY)     EKG  ED ECG REPORT I, Lavonia Drafts, the attending physician, personally viewed and  interpreted this ECG.  Date: 12/22/2021  Rhythm: normal sinus rhythm QRS Axis: normal Intervals: Abnormal ST/T Wave abnormalities: normal Narrative Interpretation: no evidence of acute ischemia    RADIOLOGY Chest x-ray viewed interpreted by me, no pneumonia    PROCEDURES:  Critical Care performed:   Procedures   MEDICATIONS ORDERED IN ED: Medications  ipratropium-albuterol (DUONEB) 0.5-2.5 (3) MG/3ML nebulizer solution 3 mL (3 mLs Nebulization Given 12/22/21 1510)  ipratropium-albuterol (DUONEB) 0.5-2.5 (3) MG/3ML nebulizer solution 3 mL (3 mLs Nebulization Given 12/22/21 1510)  ipratropium-albuterol (DUONEB) 0.5-2.5 (3) MG/3ML nebulizer solution 3 mL (3 mLs Nebulization Given 12/22/21 1552)     IMPRESSION / MDM / ASSESSMENT AND PLAN / ED COURSE  I reviewed the triage vital signs and the nursing notes. Patient's presentation is most consistent with severe exacerbation of chronic illness.  Patient with a history of asthma presents with shortness of breath, cough, chest tightness  Differential includes asthma exacerbation, bronchitis, pneumonia, pneumothorax  Lab work reviewed: Elevated white blood cell count  likely related to prednisone usage, high sensitive troponin and BNP are normal  Scattered wheezing on exam suspicious for asthma exacerbation, will treat with DuoNebs  Chest x-ray negative for pneumonia/pneumothorax  ----------------------------------------- 3:44 PM on 12/22/2021 ----------------------------------------- Patient feeling better after DuoNeb, will give an additional DuoNeb and reevaluate   ----------------------------------------- 4:22 PM on 12/22/2021 ----------------------------------------- Considered admission however patient feeling much improved, wheezing has resolved, appropriate for discharge at this time       FINAL CLINICAL IMPRESSION(S) / ED DIAGNOSES   Final diagnoses:  Exacerbation of asthma, unspecified asthma severity,  unspecified whether persistent     Rx / DC Orders   ED Discharge Orders          Ordered    For home use only DME Nebulizer machine        12/22/21 1621    ipratropium-albuterol (DUONEB) 0.5-2.5 (3) MG/3ML SOLN  Every 6 hours PRN        12/22/21 1621             Note:  This document was prepared using Dragon voice recognition software and may include unintentional dictation errors.   Lavonia Drafts, MD 12/22/21 Gevena Cotton    Lavonia Drafts, MD 12/22/21 1623

## 2021-12-22 NOTE — ED Triage Notes (Signed)
Pt here with cp for 1 week. Pt went to [facility] and was given steroids and other medications that did not help. Pt has very bad asthma , pt coughing in triage room. Pt states pain is in the center of her chest.

## 2022-01-06 ENCOUNTER — Other Ambulatory Visit: Payer: Self-pay

## 2022-01-06 ENCOUNTER — Emergency Department
Admission: EM | Admit: 2022-01-06 | Discharge: 2022-01-06 | Disposition: A | Discharge status: Home / Self Care | Payer: Medicaid Other | Attending: Emergency Medicine | Admitting: Emergency Medicine

## 2022-01-06 ENCOUNTER — Emergency Department: Payer: Medicaid Other

## 2022-01-06 DIAGNOSIS — J4521 Mild intermittent asthma with (acute) exacerbation: Secondary | ICD-10-CM | POA: Insufficient documentation

## 2022-01-06 DIAGNOSIS — R0602 Shortness of breath: Secondary | ICD-10-CM | POA: Diagnosis present

## 2022-01-06 DIAGNOSIS — Z720 Tobacco use: Secondary | ICD-10-CM | POA: Diagnosis not present

## 2022-01-06 LAB — BASIC METABOLIC PANEL
Anion gap: 4 — ABNORMAL LOW (ref 5–15)
BUN: 9 mg/dL (ref 6–20)
CO2: 24 mmol/L (ref 22–32)
Calcium: 8 mg/dL — ABNORMAL LOW (ref 8.9–10.3)
Chloride: 112 mmol/L — ABNORMAL HIGH (ref 98–111)
Creatinine, Ser: 0.81 mg/dL (ref 0.44–1.00)
GFR, Estimated: >60 mL/min (ref >60)
Glucose, Bld: 97 mg/dL (ref 70–99)
Potassium: 3.1 mmol/L — ABNORMAL LOW (ref 3.5–5.1)
Sodium: 140 mmol/L (ref 135–145)

## 2022-01-06 LAB — CBC WITH DIFFERENTIAL/PLATELET
Abs Immature Granulocytes: 0.07 10*3/uL (ref 0.00–0.07)
Basophils Absolute: 0.1 10*3/uL (ref 0.0–0.1)
Basophils Relative: 1 %
Eosinophils Absolute: 0.1 10*3/uL (ref 0.0–0.5)
Eosinophils Relative: 1 %
HCT: 43.8 % (ref 36.0–46.0)
Hemoglobin: 13.7 g/dL (ref 12.0–15.0)
Immature Granulocytes: 1 %
Lymphocytes Relative: 14 %
Lymphs Abs: 2.1 10*3/uL (ref 0.7–4.0)
MCH: 27.7 pg (ref 26.0–34.0)
MCHC: 31.3 g/dL (ref 30.0–36.0)
MCV: 88.7 fL (ref 80.0–100.0)
Monocytes Absolute: 1.2 10*3/uL — ABNORMAL HIGH (ref 0.1–1.0)
Monocytes Relative: 8 %
Neutro Abs: 11.4 10*3/uL — ABNORMAL HIGH (ref 1.7–7.7)
Neutrophils Relative %: 75 %
Platelets: 318 10*3/uL (ref 150–400)
RBC: 4.94 MIL/uL (ref 3.87–5.11)
RDW: 15.5 % (ref 11.5–15.5)
WBC: 14.8 10*3/uL — ABNORMAL HIGH (ref 4.0–10.5)
nRBC: 0 % (ref 0.0–0.2)

## 2022-01-06 LAB — D-DIMER, QUANTITATIVE: D-Dimer, Quant: <0.27 ug/mL-FEU (ref 0.00–0.50)

## 2022-01-06 MED ORDER — PREDNISONE 20 MG PO TABS: 40.0000 mg | ORAL_TABLET | Freq: Every day | ORAL | 0 refills | Status: AC

## 2022-01-06 MED ORDER — IPRATROPIUM-ALBUTEROL 0.5-2.5 (3) MG/3ML IN SOLN: 3.0000 mL | RESPIRATORY_TRACT | 0 refills | Status: DC | PRN

## 2022-01-06 MED ORDER — IPRATROPIUM-ALBUTEROL 0.5-2.5 (3) MG/3ML IN SOLN
3.0000 mL | Freq: Once | RESPIRATORY_TRACT | Status: AC
  Administered 2022-01-06: 3 mL via RESPIRATORY_TRACT
  Filled 2022-01-06: qty 3

## 2022-01-06 MED ORDER — BENZONATATE 100 MG PO CAPS: 100.0000 mg | ORAL_CAPSULE | Freq: Three times a day (TID) | ORAL | 0 refills | Status: DC | PRN

## 2022-01-06 MED ORDER — PSEUDOEPH-BROMPHEN-DM 30-2-10 MG/5ML PO SYRP: 5.0000 mL | ORAL_SOLUTION | Freq: Four times a day (QID) | ORAL | 0 refills | Status: DC | PRN

## 2022-01-06 MED ORDER — ALBUTEROL SULFATE HFA 108 (90 BASE) MCG/ACT IN AERS: 2.0000 | INHALATION_SPRAY | Freq: Four times a day (QID) | RESPIRATORY_TRACT | 2 refills | Status: DC | PRN

## 2022-01-06 NOTE — ED Triage Notes (Addendum)
Pt c/o sob that is not improving with inhaler at home- has history of asthma. Pt is AOX4, NAD noted. Breathing slightly labored, even, bilateral wheeze noted, cough noted. Pt reports sharp pain in left lung with inspiration.

## 2022-01-06 NOTE — ED Provider Triage Note (Signed)
Emergency Medicine Provider Triage Evaluation Note  Nicole Gross , a 36 y.o. female  was evaluated in triage.  Pt complains of shortness of breath. History of asthma. No relief with her albuterol. She has also had a pain in the left lateral chest for the past few days.   Physical Exam  BP 127/72 (BP Location: Left Arm)   Pulse (!) 106   Temp 98.6 F (37 C) (Oral)   Resp (!) 22   SpO2 99%  Gen:   Awake, no distress   Resp:  Normal effort  MSK:   Moves extremities without difficulty  Other:    Medical Decision Making  Medically screening exam initiated at 4:56 PM.  Appropriate orders placed.  Nicole Gross was informed that the remainder of the evaluation will be completed by another provider, this initial triage assessment does not replace that evaluation, and the importance of remaining in the ED until their evaluation is complete.  Duoneb ordered. Steroid given at urgent care per patient.   Victorino Dike, FNP 01/06/22 1700

## 2022-01-06 NOTE — ED Notes (Signed)
Dc ppw provided. Questions, followup and rx information reviewed as needed. pt declines vs and provides verbal consent at this time. Pt alert and oriented to lobby.

## 2022-01-06 NOTE — ED Notes (Signed)
See triage note. Pt wheezing and dry cough present. Pt denies fever. Pt resting calmly on stretcher. Pt finished OJ she brought with her and requested more. Pt given cup of OJ; pt denies dx of diabetes.

## 2022-01-06 NOTE — ED Provider Notes (Signed)
[facility] Emergency Department Provider Note     Event Date/Time   First MD Initiated Contact with Patient 01/06/22 1844     (approximate)   History   Shortness of Breath   HPI  Nicole Gross is a 36 y.o. female with a history of asthma, tobacco use, and anemia presents with c/o SOB and asthma exacerbation. She denies improvement with home inhaler use. She notes wheezing and cough. She also complains of sharp left chest wall pain with inspiration. She reports her symptoms are likely related to her home rental situation. She lives with a women who keep the [facility] set to 66 degrees, and believes the cold air exacerbates her cough/wheeze.  Patient was apparently seen 2 weeks ago, and given a handwritten prescription for a nebulizer machine.  She reports she had misplaced the prescription, is requesting a re-print for the nebulizer.   Physical Exam   Triage Vital Signs: ED Triage Vitals  Enc Vitals Group     BP 01/06/22 1655 127/72     Pulse Rate 01/06/22 1655 (!) 106     Resp 01/06/22 1655 (!) 22     Temp 01/06/22 1655 98.6 F (37 C)     Temp Source 01/06/22 1655 Oral     SpO2 01/06/22 1655 99 %     Weight --      Height --      Head Circumference --      Peak Flow --      Pain Score 01/06/22 1656 8     Pain Loc --      Pain Edu? --      Excl. in [place]? --     Most recent vital signs: Vitals:   01/06/22 1946 01/06/22 2011  BP:    Pulse: 85 96  Resp:    Temp:    SpO2: 100% 97%    General Awake, no distress.  CV:  Good peripheral perfusion.  RESP:  Normal effort. CTA ABD:  No distention.    ED Results / Procedures / Treatments   Labs (all labs ordered are listed, but only abnormal results are displayed) Labs Reviewed  BASIC METABOLIC PANEL - Abnormal; Notable for the following components:      Result Value   Potassium 3.1 (*)    Chloride 112 (*)    Calcium 8.0 (*)    Anion gap 4 (*)    All other components within normal limits   CBC WITH DIFFERENTIAL/PLATELET - Abnormal; Notable for the following components:   WBC 14.8 (*)    Neutro Abs 11.4 (*)    Monocytes Absolute 1.2 (*)    All other components within normal limits  D-DIMER, QUANTITATIVE     EKG   RADIOLOGY  I personally viewed and evaluated these images as part of my medical decision making, as well as reviewing the written report by the radiologist.  ED Provider Interpretation: No acute findings}  DG Chest 2 View  Result Date: 01/06/2022 CLINICAL DATA:  Shortness of breath EXAM: CHEST - 2 VIEW COMPARISON:  12/22/2021 FINDINGS: Cardiac size is within normal limits. There are no signs of pulmonary edema or focal pulmonary consolidation. There is no pleural effusion or pneumothorax. Possible azygous fissure is seen in the medial right upper lung fields. IMPRESSION: No active cardiopulmonary disease. Electronically Signed   By: Elmer Picker M.D.   On: 01/06/2022 17:25     PROCEDURES:  Critical Care performed: No  Procedures   MEDICATIONS  ORDERED IN ED: Medications  ipratropium-albuterol (DUONEB) 0.5-2.5 (3) MG/3ML nebulizer solution 3 mL (3 mLs Nebulization Given 01/06/22 1701)  ipratropium-albuterol (DUONEB) 0.5-2.5 (3) MG/3ML nebulizer solution 3 mL (3 mLs Nebulization Given 01/06/22 1943)     IMPRESSION / MDM / ASSESSMENT AND PLAN / ED COURSE  I reviewed the triage vital signs and the nursing notes.                              Differential diagnosis includes, but is not limited to, viral syndrome, bronchitis including COPD exacerbation, pneumonia, reactive airway disease including asthma, CHF including exacerbation with or without pulmonary/interstitial edema, pneumothorax, ACS, thoracic trauma, and pulmonary embolism.   Patient's presentation is most consistent with acute complicated illness / injury requiring diagnostic workup.  Patient's diagnosis is consistent with asthma exacerbation and bronchospasm.  Patient with reassuring  work-up overall.  She reports improvement of her symptoms after DuoNeb x2.  No wheezing or adventitious breath sounds are noted.  Patient is stable for discharge at this time as she has no signs of acute respiratory distress and no indication for admission at this time.  Patient will be discharged home with prescriptions for Tessalon Perles, prednisone, Bromfed-DM syrup, albuterol MDI, nebulizer solution, and a nebulizer machine. Patient is to follow up with her primary provider or local community clinic as needed or otherwise directed. Patient is given ED precautions to return to the ED for any worsening or new symptoms.   FINAL CLINICAL IMPRESSION(S) / ED DIAGNOSES   Final diagnoses:  Mild intermittent asthma with exacerbation     Rx / DC Orders   ED Discharge Orders          Ordered    benzonatate (TESSALON PERLES) 100 MG capsule  3 times daily PRN        01/06/22 2021    albuterol (VENTOLIN HFA) 108 (90 Base) MCG/ACT inhaler  Every 6 hours PRN        01/06/22 2021    brompheniramine-pseudoephedrine-DM 30-2-10 MG/5ML syrup  4 times daily PRN        01/06/22 2021    predniSONE (DELTASONE) 20 MG tablet  Daily with breakfast        01/06/22 2021    ipratropium-albuterol (DUONEB) 0.5-2.5 (3) MG/3ML SOLN  Every 4 hours PRN        01/06/22 2021             Note:  This document was prepared using Dragon voice recognition software and may include unintentional dictation errors.    Melvenia Needles, PA-C 01/06/22 2055    Lucrezia Starch, MD 01/06/22 2231

## 2022-01-06 NOTE — Discharge Instructions (Addendum)
Take the prescription meds as directed.  Follow-up with primary provider or charge community clinic for ongoing symptoms.

## 2022-01-06 NOTE — ED Notes (Signed)
Lung sound clear bilaterally, pt reports improved WOB post- neb. Cough still present

## 2022-03-14 NOTE — Progress Notes (Deleted)
    GYNECOLOGY PROGRESS NOTE  Subjective:    Patient ID: Nicole Gross, female    DOB: 06-13-1986, 36 y.o.   MRN: [id number]  HPI  Patient is a 36 y.o. G63P0050 female who presents for consultation for surgery for Fibroids. She has h/o PID, recurrent miscarriages, and newly diagnosed fibroid uterus, she would like to consider alternative methods of conception (consider IVF or surrogacy). It has been explained to her that she would likely require fibroid removal if IVF was planned.   {Common ambulatory SmartLinks:19316}  Review of Systems {ros; complete:30496}   Objective:   unknown if currently breastfeeding. There is no height or weight on file to calculate BMI. General appearance: {general exam:16600} Abdomen: {abdominal exam:16834} Pelvic: {pelvic exam:16852::"cervix normal in appearance","external genitalia normal","no adnexal masses or tenderness","no cervical motion tenderness","rectovaginal septum normal","uterus normal size, shape, and consistency","vagina normal without discharge"} Extremities: {extremity exam:5109} Neurologic: {neuro exam:17854}   Assessment:   No diagnosis found.   Plan:   There are no diagnoses linked to this encounter.    Rubie Maid, MD [facility]

## 2022-03-15 ENCOUNTER — Encounter: Payer: Self-pay | Admitting: Obstetrics and Gynecology

## 2022-04-19 ENCOUNTER — Ambulatory Visit: Payer: Medicaid Other

## 2022-07-12 ENCOUNTER — Emergency Department: Payer: Medicaid Other

## 2022-07-12 ENCOUNTER — Other Ambulatory Visit: Payer: Self-pay

## 2022-07-12 ENCOUNTER — Emergency Department
Admission: EM | Admit: 2022-07-12 | Discharge: 2022-07-12 | Disposition: A | Discharge status: Home / Self Care | Payer: Medicaid Other | Attending: Emergency Medicine | Admitting: Emergency Medicine

## 2022-07-12 DIAGNOSIS — J45909 Unspecified asthma, uncomplicated: Secondary | ICD-10-CM | POA: Insufficient documentation

## 2022-07-12 DIAGNOSIS — N739 Female pelvic inflammatory disease, unspecified: Secondary | ICD-10-CM | POA: Diagnosis not present

## 2022-07-12 DIAGNOSIS — A549 Gonococcal infection, unspecified: Secondary | ICD-10-CM | POA: Insufficient documentation

## 2022-07-12 DIAGNOSIS — R102 Pelvic and perineal pain: Secondary | ICD-10-CM | POA: Diagnosis present

## 2022-07-12 DIAGNOSIS — N730 Acute parametritis and pelvic cellulitis: Secondary | ICD-10-CM

## 2022-07-12 LAB — URINALYSIS, ROUTINE W REFLEX MICROSCOPIC
Bilirubin Urine: NEGATIVE
Glucose, UA: NEGATIVE mg/dL
Ketones, ur: NEGATIVE mg/dL
Nitrite: NEGATIVE
Protein, ur: 30 mg/dL — AB
Specific Gravity, Urine: 1.024 (ref 1.005–1.030)
pH: 5 (ref 5.0–8.0)

## 2022-07-12 LAB — CBC WITH DIFFERENTIAL/PLATELET
Abs Immature Granulocytes: 0.05 10*3/uL (ref 0.00–0.07)
Basophils Absolute: 0.1 10*3/uL (ref 0.0–0.1)
Basophils Relative: 1 %
Eosinophils Absolute: 0.4 10*3/uL (ref 0.0–0.5)
Eosinophils Relative: 4 %
HCT: 37 % (ref 36.0–46.0)
Hemoglobin: 11.4 g/dL — ABNORMAL LOW (ref 12.0–15.0)
Immature Granulocytes: 0 %
Lymphocytes Relative: 23 %
Lymphs Abs: 2.6 10*3/uL (ref 0.7–4.0)
MCH: 26.6 pg (ref 26.0–34.0)
MCHC: 30.8 g/dL (ref 30.0–36.0)
MCV: 86.2 fL (ref 80.0–100.0)
Monocytes Absolute: 0.6 10*3/uL (ref 0.1–1.0)
Monocytes Relative: 6 %
Neutro Abs: 7.6 10*3/uL (ref 1.7–7.7)
Neutrophils Relative %: 66 %
Platelets: 466 10*3/uL — ABNORMAL HIGH (ref 150–400)
RBC: 4.29 MIL/uL (ref 3.87–5.11)
RDW: 15.3 % (ref 11.5–15.5)
WBC: 11.3 10*3/uL — ABNORMAL HIGH (ref 4.0–10.5)
nRBC: 0 % (ref 0.0–0.2)

## 2022-07-12 LAB — COMPREHENSIVE METABOLIC PANEL
ALT: 12 U/L (ref 0–44)
AST: 23 U/L (ref 15–41)
Albumin: 3 g/dL — ABNORMAL LOW (ref 3.5–5.0)
Alkaline Phosphatase: 89 U/L (ref 38–126)
Anion gap: 5 (ref 5–15)
BUN: 13 mg/dL (ref 6–20)
CO2: 27 mmol/L (ref 22–32)
Calcium: 8.4 mg/dL — ABNORMAL LOW (ref 8.9–10.3)
Chloride: 107 mmol/L (ref 98–111)
Creatinine, Ser: 0.96 mg/dL (ref 0.44–1.00)
GFR, Estimated: >60 mL/min (ref >60)
Glucose, Bld: 100 mg/dL — ABNORMAL HIGH (ref 70–99)
Potassium: 4 mmol/L (ref 3.5–5.1)
Sodium: 139 mmol/L (ref 135–145)
Total Bilirubin: 0.3 mg/dL (ref 0.3–1.2)
Total Protein: 6.4 g/dL — ABNORMAL LOW (ref 6.5–8.1)

## 2022-07-12 LAB — CHLAMYDIA/NGC RT PCR (ARMC ONLY)
Chlamydia Tr: NOT DETECTED
N gonorrhoeae: DETECTED — AB

## 2022-07-12 LAB — WET PREP, GENITAL
Sperm: NONE SEEN
Trich, Wet Prep: NONE SEEN
WBC, Wet Prep HPF POC: >=10 — AB (ref <10)
Yeast Wet Prep HPF POC: NONE SEEN

## 2022-07-12 LAB — POC URINE PREG, ED: Preg Test, Ur: NEGATIVE

## 2022-07-12 LAB — LIPASE, BLOOD: Lipase: 33 U/L (ref 11–51)

## 2022-07-12 MED ORDER — GENTAMICIN SULFATE 40 MG/ML IJ SOLN
240.0000 mg | Freq: Once | INTRAMUSCULAR | Status: AC
Start: 2022-07-12 — End: 2022-07-12
  Administered 2022-07-12: 240 mg via INTRAMUSCULAR
  Filled 2022-07-12: qty 6

## 2022-07-12 MED ORDER — IOHEXOL 300 MG/ML  SOLN
100.0000 mL | Freq: Once | INTRAMUSCULAR | Status: AC | PRN
  Administered 2022-07-12: 100 mL via INTRAVENOUS

## 2022-07-12 MED ORDER — KETOROLAC TROMETHAMINE 15 MG/ML IJ SOLN
15.0000 mg | Freq: Once | INTRAMUSCULAR | Status: AC
Start: 2022-07-12 — End: 2022-07-12
  Administered 2022-07-12: 15 mg via INTRAVENOUS
  Filled 2022-07-12: qty 1

## 2022-07-12 MED ORDER — DOXYCYCLINE HYCLATE 100 MG PO TABS
100.0000 mg | ORAL_TABLET | Freq: Once | ORAL | Status: AC
  Administered 2022-07-12: 100 mg via ORAL
  Filled 2022-07-12: qty 1

## 2022-07-12 MED ORDER — METRONIDAZOLE 500 MG PO TABS: 500.0000 mg | ORAL_TABLET | Freq: Two times a day (BID) | ORAL | 0 refills | Status: AC

## 2022-07-12 MED ORDER — DOXYCYCLINE MONOHYDRATE 100 MG PO TABS: 100.0000 mg | ORAL_TABLET | Freq: Two times a day (BID) | ORAL | 0 refills | Status: AC

## 2022-07-12 MED ORDER — METRONIDAZOLE 500 MG PO TABS
500.0000 mg | ORAL_TABLET | Freq: Once | ORAL | Status: AC
  Administered 2022-07-12: 500 mg via ORAL
  Filled 2022-07-12: qty 1

## 2022-07-12 NOTE — ED Provider Triage Note (Signed)
Emergency Medicine Provider Triage Evaluation Note  Nicole Gross, a 37 y.o. female  was evaluated in triage.  Pt complains of pelvic pain consistent with her fibroid uterus pain. She notes a normal LMP last month (12/24). She is under the care of Dr. Marcelline Mates and has discussed myomectomy/fibroidectomy in the past. She denies menorrhagia/metrorrhagia at this time.  Review of Systems  Positive: Pelvic pain/fibroids Negative: FCS, NVD  Physical Exam  BP 126/81 (BP Location: Left Arm)   Pulse 93   Temp 98.6 F (37 C) (Oral)   Resp 16   Ht '4\' 11"'$  (1.499 m)   Wt 74.8 kg   LMP 07/02/2022   SpO2 99%   BMI 33.31 kg/m  Gen:   Awake, no distress  NAD Resp:  Normal effort CTA MSK:   Moves extremities without difficulty  Other:    Medical Decision Making  Medically screening exam initiated at 3:11 PM.  Appropriate orders placed.  Nicole Gross was informed that the remainder of the evaluation will be completed by another provider, this initial triage assessment does not replace that evaluation, and the importance of remaining in the ED until their evaluation is complete.  Patient to the ED for evaluation of pelvic pain due to fibroids.    Melvenia Needles, PA-C 07/12/22 1523

## 2022-07-12 NOTE — ED Notes (Signed)
Called lab for swabs for wet prep (out).

## 2022-07-12 NOTE — Discharge Instructions (Addendum)
You likely have PID and you tested positive for gonorrhea as well as BV.  Please take the 2 antibiotics twice a day for the next 14 days.  Is very important that you follow-up with your gynecologist as you will need to be tested to ensure that the gonorrhea was adequately treated as we are not able to give you the preferred antibiotics given your allergy.

## 2022-07-12 NOTE — ED Triage Notes (Signed)
C/O uterine cramping. States has 3 known fibroids.  States symptoms started about 3 weeks ago.  AAOx3.  Skin warm and dry . NAD
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
## 2022-07-12 NOTE — ED Notes (Signed)
See triage note. Sharp stabbing pain on R side of pelvic area has been lasting for approximately three weeks. Says she is experiencing discharge and light pink spotting the entire three weeks. Denies any cramping.

## 2022-08-21 ENCOUNTER — Telehealth: Payer: Self-pay | Admitting: Obstetrics and Gynecology

## 2022-08-21 NOTE — Telephone Encounter (Signed)
-----   Message from Rubie Maid, MD sent at 08/21/2022  9:35 AM EST ----- Regarding: RE: Does not have to be a same day. Is not urgent but just needs follow up. ----- Message ----- From: Audree Camel Sent: 08/21/2022   9:25 AM EST To: Rubie Maid, MD; Audree Camel; #  Can I offer her a same day appointment? Please advise? ----- Message ----- From: Rubie Maid, MD Sent: 08/19/2022   7:42 PM EST To: [facility]  Please contact patient about following up from ER visit. Occurred in January.   Dr. Marcelline Mates

## 2022-08-21 NOTE — Telephone Encounter (Signed)
I contacted patient via phone. I was going to offer opening in Dr. Andreas Blower scheduled for 2/14 opening at 1:55 pm. I left message for patient to call back to be scheduled.

## 2022-08-22 NOTE — Telephone Encounter (Signed)
I contacted patient via phone. Patient is scheduled for 2/14 with Dr. Marcelline Mates.

## 2022-08-23 ENCOUNTER — Ambulatory Visit: Payer: Self-pay | Admitting: Obstetrics and Gynecology

## 2022-10-05 ENCOUNTER — Other Ambulatory Visit (HOSPITAL_COMMUNITY)
Admission: RE | Admit: 2022-10-05 | Discharge: 2022-10-05 | Disposition: A | Discharge status: Home / Self Care | Payer: Medicaid Other | Source: Ambulatory Visit | Attending: Obstetrics and Gynecology | Admitting: Obstetrics and Gynecology

## 2022-10-05 ENCOUNTER — Ambulatory Visit (INDEPENDENT_AMBULATORY_CARE_PROVIDER_SITE_OTHER): Payer: Medicaid Other | Admitting: Obstetrics and Gynecology

## 2022-10-05 ENCOUNTER — Encounter: Payer: Self-pay | Admitting: Obstetrics and Gynecology

## 2022-10-05 VITALS — BP 119/75 | HR 74 | Resp 16 | Ht 59.0 in | Wt 168.7 lb

## 2022-10-05 DIAGNOSIS — Z8742 Personal history of other diseases of the female genital tract: Secondary | ICD-10-CM | POA: Diagnosis not present

## 2022-10-05 DIAGNOSIS — D259 Leiomyoma of uterus, unspecified: Secondary | ICD-10-CM

## 2022-10-05 DIAGNOSIS — Z8619 Personal history of other infectious and parasitic diseases: Secondary | ICD-10-CM | POA: Diagnosis not present

## 2022-10-05 DIAGNOSIS — Z124 Encounter for screening for malignant neoplasm of cervix: Secondary | ICD-10-CM | POA: Diagnosis present

## 2022-10-05 DIAGNOSIS — N921 Excessive and frequent menstruation with irregular cycle: Secondary | ICD-10-CM

## 2022-10-05 DIAGNOSIS — D219 Benign neoplasm of connective and other soft tissue, unspecified: Secondary | ICD-10-CM

## 2022-10-05 NOTE — Progress Notes (Signed)
GYNECOLOGY PROGRESS NOTE  Subjective:    Patient ID: Nicole Gross, female    DOB: [birth year], 37 y.o.   MRN: [id number]  HPI  Patient is a 37 y.o. [id number] female who presents for follow up on fibroids. She was last seen ~ 1 year ago. She reports that her bleeding and pain have gotten worse. She has pain in her RLQ.She believes that the pain and bleeding are related to the fibroids. She did not take medication that was prescribed to her at her last visit (Provera was never filled, notes she read the side effects and decided not to take it).  Now desires to revisit discussion of surgical intervention. Does not desire definitive hysterectomy as she would like to have at least 1 child. Would like fibroid removal. She does have a history of ectopic pregnancy x 3 with right tubal removal.   Of note, patient also seen in the ER ~ 2 months ago for pelvic pain and bleeding. Was diagnosed with PID. Treated for gonorrhea.   Menstrual History:  Patient's last menstrual period was 09/30/2022.  Period Cycle (Days): 28 Period Duration (Days): 5 Period Pattern: (!) Irregular Menstrual Flow: Heavy Menstrual Control: Maxi pad Menstrual Control Change Freq (Hours): 1-2 Dysmenorrhea: (!) Severe Dysmenorrhea Symptoms: Cramping, Throbbing, Headache, Nausea   The following portions of the patient's history were reviewed and updated as appropriate: allergies, current medications, past family history, past medical history, past social history, past surgical history, and problem list.  Review of Systems Pertinent items noted in HPI and remainder of comprehensive ROS otherwise negative.   Objective:   Blood pressure 119/75, pulse 74, resp. rate 16, height 4\' 11"  (1.499 m), weight 168 lb 11.2 oz (76.5 kg), last menstrual period 09/30/2022, unknown if currently breastfeeding. Body mass index is 34.07 kg/m. Blood pressure 119/75, pulse 74, resp. rate 16, height 4\' 11"  (1.499 m), weight 168 lb 11.2 oz (76.5 kg),  last menstrual period 09/30/2022, unknown if currently breastfeeding.  General appearance: alert, cooperative, and no distress Abdomen: soft, non-tender; bowel sounds normal; no masses,  no organomegaly Pelvic: external genitalia normal, rectovaginal septum normal.  Vagina without discharge.  Cervix normal appearing, no lesions and no motion tenderness.  Uterus enlarged, ~ 12-14 week size, mobile, irregular contours, non-tender.  Adnexae non-palpable, nontender bilaterally.  Extremities: extremities normal, atraumatic, no cyanosis or edema Neurologic: Grossly normal   Labs:  Admission on 07/12/2022, Discharged on 07/12/2022  Component Date Value Ref Range Status   Color, Urine 07/12/2022 YELLOW (A)  YELLOW Final   APPearance 07/12/2022 HAZY (A)  CLEAR Final   Specific Gravity, Urine 07/12/2022 1.024  1.005 - 1.030 Final   pH 07/12/2022 5.0  5.0 - 8.0 Final   Glucose, UA 07/12/2022 NEGATIVE  NEGATIVE mg/dL Final   Hgb urine dipstick 07/12/2022 MODERATE (A)  NEGATIVE Final   Bilirubin Urine 07/12/2022 NEGATIVE  NEGATIVE Final   Ketones, ur 07/12/2022 NEGATIVE  NEGATIVE mg/dL Final   Protein, ur 07/12/2022 30 (A)  NEGATIVE mg/dL Final   Nitrite 07/12/2022 NEGATIVE  NEGATIVE Final   Leukocytes,Ua 07/12/2022 LARGE (A)  NEGATIVE Final   RBC / HPF 07/12/2022 0-5  0 - 5 RBC/hpf Final   WBC, UA 07/12/2022 21-50  0 - 5 WBC/hpf Final   Bacteria, UA 07/12/2022 RARE (A)  NONE SEEN Final   Squamous Epithelial / HPF 07/12/2022 6-10  0 - 5 /HPF Final   Mucus 07/12/2022 PRESENT   Final   Performed at [facility], [address] [place], [place] [address]   Preg Test, Ur 07/12/2022 NEGATIVE  NEGATIVE Final   Comment:        THE SENSITIVITY OF THIS METHODOLOGY IS >24 mIU/mL    Sodium 07/12/2022 139  135 - 145 mmol/L Final   Potassium 07/12/2022 4.0  3.5 - 5.1 mmol/L Final   Chloride 07/12/2022 107  98 - 111 mmol/L Final   CO2 07/12/2022 27  22 - 32 mmol/L Final   Glucose, Bld 07/12/2022  100 (H)  70 - 99 mg/dL Final   Glucose reference range applies only to samples taken after fasting for at least 8 hours.   BUN 07/12/2022 13  6 - 20 mg/dL Final   Creatinine, Ser 07/12/2022 0.96  0.44 - 1.00 mg/dL Final   Calcium 07/12/2022 8.4 (L)  8.9 - 10.3 mg/dL Final   Total Protein 07/12/2022 6.4 (L)  6.5 - 8.1 g/dL Final   Albumin 07/12/2022 3.0 (L)  3.5 - 5.0 g/dL Final   AST 07/12/2022 23  15 - 41 U/L Final   ALT 07/12/2022 12  0 - 44 U/L Final   Alkaline Phosphatase 07/12/2022 89  38 - 126 U/L Final   Total Bilirubin 07/12/2022 0.3  0.3 - 1.2 mg/dL Final   GFR, Estimated 07/12/2022 >60  >60 mL/min Final   Comment: (NOTE) Calculated using the CKD-EPI Creatinine Equation (2021)    Anion gap 07/12/2022 5  5 - 15 Final   Performed at [facility], [address] [place], [place] [address]   WBC 07/12/2022 11.3 (H)  4.0 - 10.5 K/uL Final   RBC 07/12/2022 4.29  3.87 - 5.11 MIL/uL Final   Hemoglobin 07/12/2022 11.4 (L)  12.0 - 15.0 g/dL Final   HCT 07/12/2022 37.0  36.0 - 46.0 % Final   MCV 07/12/2022 86.2  80.0 - 100.0 fL Final   MCH 07/12/2022 26.6  26.0 - 34.0 pg Final   MCHC 07/12/2022 30.8  30.0 - 36.0 g/dL Final   RDW 07/12/2022 15.3  11.5 - 15.5 % Final   Platelets 07/12/2022 466 (H)  150 - 400 K/uL Final   nRBC 07/12/2022 0.0  0.0 - 0.2 % Final   Neutrophils Relative % 07/12/2022 66  % Final   Neutro Abs 07/12/2022 7.6  1.7 - 7.7 K/uL Final   Lymphocytes Relative 07/12/2022 23  % Final   Lymphs Abs 07/12/2022 2.6  0.7 - 4.0 K/uL Final   Monocytes Relative 07/12/2022 6  % Final   Monocytes Absolute 07/12/2022 0.6  0.1 - 1.0 K/uL Final   Eosinophils Relative 07/12/2022 4  % Final   Eosinophils Absolute 07/12/2022 0.4  0.0 - 0.5 K/uL Final   Basophils Relative 07/12/2022 1  % Final   Basophils Absolute 07/12/2022 0.1  0.0 - 0.1 K/uL Final   Immature Granulocytes 07/12/2022 0  % Final   Abs Immature Granulocytes 07/12/2022 0.05  0.00 - 0.07 K/uL Final    Performed at [facility], [address] [place], [place] [address]   Lipase 07/12/2022 33  11 - 51 U/L Final   Performed at [facility], [address] [place], [place] [address]   Specimen source GC/Chlam 07/12/2022 ENDOCERVICAL   Final   Chlamydia Tr 07/12/2022 NOT DETECTED  NOT DETECTED Final   N gonorrhoeae 07/12/2022 DETECTED (A)  NOT DETECTED Final   Comment: (NOTE) This CT/NG assay has not been evaluated in patients with a history of  hysterectomy. Performed at [facility], [phone]  [address] [place], [place] [address]    Yeast Wet Prep HPF POC 07/12/2022 NONE SEEN  NONE SEEN Final   Trich, Wet Prep 07/12/2022 NONE SEEN  NONE SEEN Final   Clue Cells Wet Prep HPF POC 07/12/2022 PRESENT (A)  NONE SEEN Final   WBC, Wet Prep HPF POC 07/12/2022 >=10 (A)  <10 Final   Sperm 07/12/2022 NONE SEEN   Final   Performed at [facility], [address], [place], [place] [address]      Imaging:' Narrative & Impression  CLINICAL DATA:  Pelvic pain for 2 days.   EXAM: TRANSABDOMINAL ULTRASOUND OF PELVIS   DOPPLER ULTRASOUND OF OVARIES   TECHNIQUE: Transabdominal ultrasound examination of the pelvis was performed including evaluation of the uterus, ovaries, adnexal regions, and pelvic cul-de-sac. Patient declined transvaginal evaluation at this time.   Color and duplex Doppler ultrasound was utilized to evaluate blood flow to the ovaries.   COMPARISON:  Pelvic ultrasound 2 days ago 10/11/2021. Abdominopelvic CT yesterday 10/12/2021   FINDINGS: Uterus   Measurements: 11.6 x 8.2 x 6.7 cm = volume: 331 mL. Anteverted. Enlarged and heterogeneous with multiple fibroids. Largest fibroid includes a 6.4 cm fibroid in the posterior fundus. 2.6 cm fibroid in the right fundus, 2.4 cm fibroid in the left fundus. These fibroids appear to be intramural or exophytic.   Endometrium   Thickness: Not well delineated by transabdominal  technique. Tentatively 4 mm.   Right ovary   Measurements: 3.5 x 1.8 x 2.3 cm = volume: 7.8 mL. Normal appearance/no adnexal mass. Ovarian blood flow is demonstrated.   Left ovary   Measurements: 3.3 x 2.7 x 3 cm = volume: 14 mL. Normal appearance/no adnexal mass. Ovarian blood flow is demonstrated.   Pulsed Doppler evaluation demonstrates normal low-resistance arterial and venous waveforms in both ovaries.   Other: No pelvic free fluid.   IMPRESSION: 1. Enlarged fibroid uterus not significantly changed from recent exam. 2. The endometrium is poorly defined on this transabdominal exam, but does not appear thickened. 3. Normal appearance of the ovaries.     Electronically Signed   By: Keith Rake M.D.   On: 10/13/2021 18:22     Assessment:   1. Fibroids   2. Cervical cancer screening   3. History of gonorrhea   4. History of PID      Plan:   1. Fibroids - Patient desires surgical intervention with myomectomy. Discussed several ways that this could be performed, including hysteroscopically (with ultrasound ablation), laparoscopically, or via UFE. Discussed risks and benefits of each procedure. Patient notes she would desire hysteroscopic method.  Notes that she is waiting on her insurance to become active, and will be ready to proceed after this. Advised patient to inform when insurance becomes active, and will submit surgery request to them at that time.  - Will likely need repeat US prior to procedure. Will order once patient's insurance is active.   2. Cervical cancer screening - Patient overdue on cervical cancer screening. Performed today as part of pre-surgical workup.  - Cytology - PAP  3. History of gonorrhea - TOC performed today. Patient treated in ER.   4. History of PID - Has previously been counseled on risk factors for infertility, including her h/o PID, ectopic pregnancies, and fibroids. Would likely require IVF for pregnancy attempts.    5.  Heavy menses with irregular  cycle and dysmenorrhea - Discussed management of cycles until surgery performed. Patient did not utilize Provera as previously  prescribed last year. Discussed alternative options, including Myfembree. Advised on benefits of cycle control as well as some reduction of size of fibroids over time depending on how long she utilizes the medication. Patient ok to try. Given samples toay and will also prescribe.      A total of 25 minutes were spent during this encounter, including review of previous progress notes, recent imaging and labs, face-to-face with time with patient involving counseling and coordination of care, as well as documentation for current visit.  Rubie Maid, MD [place]

## 2022-10-06 MED ORDER — MYFEMBREE 40-1-0.5 MG PO TABS: 1.0000 | ORAL_TABLET | Freq: Every day | ORAL | 11 refills | Status: DC

## 2022-10-06 NOTE — Patient Instructions (Signed)
Here is the link for the procedure to remove the fibroids. It is called the [employer].   [email or web address]

## 2022-10-12 LAB — CYTOLOGY - PAP
Chlamydia: NEGATIVE
Comment: NEGATIVE
Comment: NEGATIVE
Comment: NORMAL
Diagnosis: NEGATIVE
Diagnosis: REACTIVE
High risk HPV: NEGATIVE
Neisseria Gonorrhea: NEGATIVE

## 2022-10-31 ENCOUNTER — Emergency Department
Admission: EM | Admit: 2022-10-31 | Discharge: 2022-11-01 | Disposition: A | Discharge status: Home / Self Care | Payer: Medicaid Other | Attending: Emergency Medicine | Admitting: Emergency Medicine

## 2022-10-31 ENCOUNTER — Telehealth: Payer: Self-pay

## 2022-10-31 DIAGNOSIS — D219 Benign neoplasm of connective and other soft tissue, unspecified: Secondary | ICD-10-CM

## 2022-10-31 DIAGNOSIS — D259 Leiomyoma of uterus, unspecified: Secondary | ICD-10-CM | POA: Insufficient documentation

## 2022-10-31 DIAGNOSIS — R1030 Lower abdominal pain, unspecified: Secondary | ICD-10-CM

## 2022-10-31 LAB — URINALYSIS, ROUTINE W REFLEX MICROSCOPIC
Bilirubin Urine: NEGATIVE
Glucose, UA: NEGATIVE mg/dL
Hgb urine dipstick: NEGATIVE
Ketones, ur: NEGATIVE mg/dL
Nitrite: NEGATIVE
Protein, ur: NEGATIVE mg/dL
Specific Gravity, Urine: 1.029 (ref 1.005–1.030)
pH: 5 (ref 5.0–8.0)

## 2022-10-31 LAB — POC URINE PREG, ED: Preg Test, Ur: NEGATIVE

## 2022-10-31 LAB — CBC WITH DIFFERENTIAL/PLATELET
Abs Immature Granulocytes: 0.05 10*3/uL (ref 0.00–0.07)
Basophils Absolute: 0.1 10*3/uL (ref 0.0–0.1)
Basophils Relative: 1 %
Eosinophils Absolute: 0.5 10*3/uL (ref 0.0–0.5)
Eosinophils Relative: 4 %
HCT: 43.1 % (ref 36.0–46.0)
Hemoglobin: 13.4 g/dL (ref 12.0–15.0)
Immature Granulocytes: 0 %
Lymphocytes Relative: 17 %
Lymphs Abs: 2.2 10*3/uL (ref 0.7–4.0)
MCH: 26.6 pg (ref 26.0–34.0)
MCHC: 31.1 g/dL (ref 30.0–36.0)
MCV: 85.7 fL (ref 80.0–100.0)
Monocytes Absolute: 0.7 10*3/uL (ref 0.1–1.0)
Monocytes Relative: 5 %
Neutro Abs: 9.5 10*3/uL — ABNORMAL HIGH (ref 1.7–7.7)
Neutrophils Relative %: 73 %
Platelets: 294 10*3/uL (ref 150–400)
RBC: 5.03 MIL/uL (ref 3.87–5.11)
RDW: 16.4 % — ABNORMAL HIGH (ref 11.5–15.5)
WBC: 13 10*3/uL — ABNORMAL HIGH (ref 4.0–10.5)
nRBC: 0 % (ref 0.0–0.2)

## 2022-10-31 LAB — COMPREHENSIVE METABOLIC PANEL
ALT: 17 U/L (ref 0–44)
AST: 48 U/L — ABNORMAL HIGH (ref 15–41)
Albumin: 3.1 g/dL — ABNORMAL LOW (ref 3.5–5.0)
Alkaline Phosphatase: 70 U/L (ref 38–126)
Anion gap: 6 (ref 5–15)
BUN: 14 mg/dL (ref 6–20)
CO2: 26 mmol/L (ref 22–32)
Calcium: 8.2 mg/dL — ABNORMAL LOW (ref 8.9–10.3)
Chloride: 110 mmol/L (ref 98–111)
Creatinine, Ser: 0.89 mg/dL (ref 0.44–1.00)
GFR, Estimated: >60 mL/min (ref >60)
Glucose, Bld: 100 mg/dL — ABNORMAL HIGH (ref 70–99)
Potassium: 3.8 mmol/L (ref 3.5–5.1)
Sodium: 142 mmol/L (ref 135–145)
Total Bilirubin: 0.3 mg/dL (ref 0.3–1.2)
Total Protein: 5.8 g/dL — ABNORMAL LOW (ref 6.5–8.1)

## 2022-10-31 MED ORDER — TRAMADOL HCL 50 MG PO TABS: 50.0000 mg | ORAL_TABLET | Freq: Four times a day (QID) | ORAL | 0 refills | Status: DC | PRN

## 2022-10-31 NOTE — ED Provider Notes (Signed)
[facility] Provider Note    Event Date/Time   First MD Initiated Contact with Patient 10/31/22 2346     (approximate)   History   Abdominal Pain   HPI  Nicole Gross is a 37 y.o. female   Past medical history of fibroid uterus, PID, prior ectopic pregnancy who presents to the emergency department with lower central abdominal pain consistent with her fibroid pain in the past.  Was started on a hormonal therapy by her gynecologist but this has not helped with pain.  She is scheduled for a preoperative appointment next week for procedure to remove fibroids.  She denies urinary symptoms or vaginal discharge.  Denies fever.  Denies any difference in quality or location of pain when her fibroid pain has bothered her in the past.   External Medical Documents Reviewed: Telephone encounter dated yesterday from Dr. Valentino Saxon of obstetrics/gynecology who ordered her tramadol for pain      Physical Exam   Triage Vital Signs: ED Triage Vitals  Enc Vitals Group     BP 10/31/22 1941 (!) 152/95     Pulse Rate 10/31/22 1941 93     Resp 10/31/22 1941 18     Temp 10/31/22 1941 (!) 97.5 F (36.4 C)     Temp Source 10/31/22 1941 Oral     SpO2 10/31/22 1941 99 %     Weight 10/31/22 1942 166 lb (75.3 kg)     Height 10/31/22 1942  (1.499 m)     Head Circumference --      Peak Flow --      Pain Score 10/31/22 1942 10     Pain Loc --      Pain Edu? --      Excl. in [place]? --     Most recent vital signs: Vitals:   10/31/22 1941  BP: (!) 152/95  Pulse: 93  Resp: 18  Temp: (!) 97.5 F (36.4 C)  SpO2: 99%    General: Awake, no distress.  CV:  Good peripheral perfusion.  Resp:  Normal effort.  Abd:  No distention.  Other:  Awake alert comfortable nontoxic-appearing.  Mildly distended abdomen with a lower central mass palpated and tenderness to palpation.  No left lower or right lower quadrant tenderness   ED Results / Procedures / Treatments    Labs (all labs ordered are listed, but only abnormal results are displayed) Labs Reviewed  CBC WITH DIFFERENTIAL/PLATELET - Abnormal; Notable for the following components:      Result Value   WBC 13.0 (*)    RDW 16.4 (*)    Neutro Abs 9.5 (*)    All other components within normal limits  COMPREHENSIVE METABOLIC PANEL - Abnormal; Notable for the following components:   Glucose, Bld 100 (*)    Calcium 8.2 (*)    Total Protein 5.8 (*)    Albumin 3.1 (*)    AST 48 (*)    All other components within normal limits  URINALYSIS, ROUTINE W REFLEX MICROSCOPIC - Abnormal; Notable for the following components:   Color, Urine YELLOW (*)    APPearance CLOUDY (*)    Leukocytes,Ua TRACE (*)    Bacteria, UA RARE (*)    All other components within normal limits  CHLAMYDIA/NGC RT PCR ([facility] ONLY)            WET PREP, GENITAL  POC URINE PREG, ED     I ordered and reviewed the above labs they are notable for  mildly elevated white blood cell count of 13, normal H&H    PROCEDURES:  Critical Care performed: No  Procedures   MEDICATIONS ORDERED IN ED: Medications  ketorolac (TORADOL) 30 MG/ML injection 30 mg (has no administration in time range)  acetaminophen (TYLENOL) tablet 1,000 mg (has no administration in time range)  oxyCODONE (Oxy IR/ROXICODONE) immediate release tablet 5 mg (has no administration in time range)    IMPRESSION / MDM / ASSESSMENT AND PLAN / ED COURSE  I reviewed the triage vital signs and the nursing notes.                                Patient's presentation is most consistent with acute presentation with potential threat to life or bodily function.  Differential diagnosis includes, but is not limited to, fibroid uterus, PID, urinary tract infection, pregnancy related complication like ectopic pregnancy, appendicitis, diverticulitis, cholecystitis   The patient is on the cardiac monitor to evaluate for evidence of arrhythmia and/or significant heart rate  changes.  MDM: This is a patient with lower abdominal pain history of fibroid uterus is causing pain in the past.  This is very similar to prior occurrences.  She has had multiple CT scans that have shown fibroids in her uterus.  I discussed the utility of CT scan today given her history and similar pain attributable to fibroids, risks and benefits of radiation exposure, and unlikely other acute surgical pathologies given otherwise benign exam today and the patient elects to forego CT scan and focus on pain control.  She request STI testing today and I ordered for STI testing, patient preferring self swab method.  She is not pregnant.  Have ordered IM Toradol, Tylenol, and oxycodone for pain control.  She says that tramadol has not worked and so I told her to refrain from taking this while trialing oxycodone.  She will follow-up with her obstetrics/gynecologist.       FINAL CLINICAL IMPRESSION(S) / ED DIAGNOSES   Final diagnoses:  Lower abdominal pain  Fibroids     Rx / DC Orders   ED Discharge Orders     None        Note:  This document was prepared using Dragon voice recognition software and may include unintentional dictation errors.    Pilar Jarvis, MD 11/01/22 [phone]

## 2022-10-31 NOTE — ED Triage Notes (Signed)
Ambulatory to triage with steady gait without use of assistive deices.  Pt c./o abd pain/ pelvic x 2 days. Hx of uterine fibroids. Denies any vaginal bleeding/discharge. Denies urinary sx.  Given medications by OB/Gyn but pain has not improved.

## 2022-10-31 NOTE — Telephone Encounter (Signed)
Pt calling; [facility] had rx'd myfembree for her; it has stopped the bleeding but the cramps have gotten to be unbearable; feels like her insides are going to fall out; has taken IBU all weeks and it hasn't helped at all; can something be rx'd.  Pharm correct in chart. [phone]

## 2022-10-31 NOTE — Telephone Encounter (Signed)
Sent patient message via mychart.

## 2022-10-31 NOTE — Telephone Encounter (Signed)
Please inform patient that I have sent in some Tramadol for her. If her pain continues beyond this, she may need to come in for an appointment.

## 2022-11-01 LAB — WET PREP, GENITAL
Sperm: NONE SEEN
Trich, Wet Prep: NONE SEEN
WBC, Wet Prep HPF POC: <10 (ref <10)
Yeast Wet Prep HPF POC: NONE SEEN

## 2022-11-01 LAB — CHLAMYDIA/NGC RT PCR (ARMC ONLY)
Chlamydia Tr: NOT DETECTED
N gonorrhoeae: NOT DETECTED

## 2022-11-01 MED ORDER — OXYCODONE HCL 5 MG PO TABS
5.0000 mg | ORAL_TABLET | Freq: Once | ORAL | Status: AC
  Administered 2022-11-01: 5 mg via ORAL
  Filled 2022-11-01: qty 1

## 2022-11-01 MED ORDER — ACETAMINOPHEN 500 MG PO TABS
1000.0000 mg | ORAL_TABLET | Freq: Once | ORAL | Status: AC
  Administered 2022-11-01: 1000 mg via ORAL
  Filled 2022-11-01: qty 2

## 2022-11-01 MED ORDER — OXYCODONE HCL 5 MG PO TABS: 5.0000 mg | ORAL_TABLET | Freq: Three times a day (TID) | ORAL | 0 refills | Status: DC | PRN

## 2022-11-01 MED ORDER — KETOROLAC TROMETHAMINE 30 MG/ML IJ SOLN
30.0000 mg | Freq: Once | INTRAMUSCULAR | Status: AC
  Administered 2022-11-01: 30 mg via INTRAMUSCULAR
  Filled 2022-11-01: qty 1

## 2022-11-01 NOTE — Discharge Instructions (Signed)
Your pain is most likely due to fibroids, please take Tylenol 650 mg every 6 hours, Motrin 400 mg every 6 hours, and oxycodone only for severe/breakthrough pain as prescribed.  Do not take tramadol if you are taking oxycodone.  Follow-up with Dr. Valentino Saxon.  If you experience any new or unexpected symptoms including severe pain, migration of pain, high fever bleeding come back to the emergency department.

## 2022-11-02 ENCOUNTER — Emergency Department
Admission: EM | Admit: 2022-11-02 | Discharge: 2022-11-03 | Disposition: A | Discharge status: Home / Self Care | Payer: Medicaid Other | Attending: Student in an Organized Health Care Education/Training Program | Admitting: Student in an Organized Health Care Education/Training Program

## 2022-11-02 ENCOUNTER — Telehealth: Payer: Self-pay | Admitting: Obstetrics and Gynecology

## 2022-11-02 ENCOUNTER — Emergency Department: Payer: Medicaid Other

## 2022-11-02 ENCOUNTER — Other Ambulatory Visit: Payer: Self-pay

## 2022-11-02 DIAGNOSIS — R102 Pelvic and perineal pain: Secondary | ICD-10-CM | POA: Diagnosis present

## 2022-11-02 DIAGNOSIS — D259 Leiomyoma of uterus, unspecified: Secondary | ICD-10-CM | POA: Insufficient documentation

## 2022-11-02 LAB — COMPREHENSIVE METABOLIC PANEL
ALT: 14 U/L (ref 0–44)
AST: 26 U/L (ref 15–41)
Albumin: 2.8 g/dL — ABNORMAL LOW (ref 3.5–5.0)
Alkaline Phosphatase: 62 U/L (ref 38–126)
Anion gap: 3 — ABNORMAL LOW (ref 5–15)
BUN: 10 mg/dL (ref 6–20)
CO2: 28 mmol/L (ref 22–32)
Calcium: 8.1 mg/dL — ABNORMAL LOW (ref 8.9–10.3)
Chloride: 107 mmol/L (ref 98–111)
Creatinine, Ser: 0.82 mg/dL (ref 0.44–1.00)
GFR, Estimated: >60 mL/min (ref >60)
Glucose, Bld: 95 mg/dL (ref 70–99)
Potassium: 3.7 mmol/L (ref 3.5–5.1)
Sodium: 138 mmol/L (ref 135–145)
Total Bilirubin: 0.3 mg/dL (ref 0.3–1.2)
Total Protein: 5.9 g/dL — ABNORMAL LOW (ref 6.5–8.1)

## 2022-11-02 LAB — CBC
HCT: 36.1 % (ref 36.0–46.0)
Hemoglobin: 11.3 g/dL — ABNORMAL LOW (ref 12.0–15.0)
MCH: 26.9 pg (ref 26.0–34.0)
MCHC: 31.3 g/dL (ref 30.0–36.0)
MCV: 86 fL (ref 80.0–100.0)
Platelets: 308 10*3/uL (ref 150–400)
RBC: 4.2 MIL/uL (ref 3.87–5.11)
RDW: 16.2 % — ABNORMAL HIGH (ref 11.5–15.5)
WBC: 10.3 10*3/uL (ref 4.0–10.5)
nRBC: 0 % (ref 0.0–0.2)

## 2022-11-02 LAB — URINALYSIS, ROUTINE W REFLEX MICROSCOPIC
Bacteria, UA: NONE SEEN
Bilirubin Urine: NEGATIVE
Glucose, UA: NEGATIVE mg/dL
Ketones, ur: NEGATIVE mg/dL
Leukocytes,Ua: NEGATIVE
Nitrite: NEGATIVE
Protein, ur: NEGATIVE mg/dL
Specific Gravity, Urine: 1.006 (ref 1.005–1.030)
pH: 6 (ref 5.0–8.0)

## 2022-11-02 LAB — LIPASE, BLOOD: Lipase: 32 U/L (ref 11–51)

## 2022-11-02 LAB — POC URINE PREG, ED: Preg Test, Ur: NEGATIVE

## 2022-11-02 MED ORDER — MORPHINE SULFATE (PF) 4 MG/ML IV SOLN
4.0000 mg | INTRAVENOUS | Status: DC | PRN
  Administered 2022-11-02: 4 mg via INTRAVENOUS
  Filled 2022-11-02: qty 1

## 2022-11-02 MED ORDER — ONDANSETRON HCL 4 MG/2ML IJ SOLN
4.0000 mg | Freq: Once | INTRAMUSCULAR | Status: AC
  Administered 2022-11-02: 4 mg via INTRAVENOUS
  Filled 2022-11-02: qty 2

## 2022-11-02 NOTE — Telephone Encounter (Signed)
The only thing I can see is a work-in spot on May 1st at 11:15 that is earlier than her current appointment. Patient appears to have been prescribed stronger pain medication in the ER 2 days ago when she was seen, since the Tramadol I prescribed did not work. Advise to discontinue taking the Myfembree since her pain seemed to worsen after starting this medication.

## 2022-11-02 NOTE — Telephone Encounter (Signed)
The patient contacted the office requesting a sooner appointment prior to 5/3 with Dr. Valentino Saxon due to pain. I advised that patient we didn't have any thing open with Dr. Valentino Saxon. She has been added to cancellation list with High priority for scheduling. I sent the patient to triage for information about what to help with pain. Please advise?

## 2022-11-02 NOTE — ED Provider Notes (Signed)
[facility] Provider Note    Event Date/Time   First MD Initiated Contact with Patient 11/02/22 2142     (approximate)   History   Abdominal Pain   HPI  Nicole Gross is a 37 y.o. female with history of uterine fibroids presents to the ER for evaluation of worsening pelvic pain.  Patient worried this may be related to her fibroids was seen in the ER few days ago and discharged with pain medication but is now having worsening pain without relief pain medications.  She denies flank pain chest pain or shortness of breath no dysuria.     Physical Exam   Triage Vital Signs: ED Triage Vitals  Enc Vitals Group     BP 11/02/22 1820 118/75     Pulse Rate 11/02/22 1820 89     Resp 11/02/22 1820 16     Temp 11/02/22 1820 98.5 F (36.9 C)     Temp src --      SpO2 11/02/22 1820 97 %     Weight 11/02/22 1821 166 lb (75.3 kg)     Height 11/02/22 1821  (1.499 m)     Head Circumference --      Peak Flow --      Pain Score 11/02/22 1821 10     Pain Loc --      Pain Edu? --      Excl. in GC? --     Most recent vital signs: Vitals:   11/02/22 1820 11/02/22 2133  BP: 118/75 129/82  Pulse: 89 88  Resp: 16 18  Temp: 98.5 F (36.9 C)   SpO2: 97% 100%     Constitutional: Alert  Eyes: Conjunctivae are normal.  Head: Atraumatic. Nose: No congestion/rhinnorhea. Mouth/Throat: Mucous membranes are moist.   Neck: Painless ROM.  Cardiovascular:   Good peripheral circulation. Respiratory: Normal respiratory effort.  No retractions.  Gastrointestinal: Soft with suprapubic tenderness to palpation.  No right lower quadrant tenderness palpation mild left lower quadrant tenderness to palpation.  No guarding or rebound. Musculoskeletal:  no deformity Neurologic:  MAE spontaneously. No gross focal neurologic deficits are appreciated.  Skin:  Skin is warm, dry and intact. No rash noted. Psychiatric: Mood and affect are normal. Speech and behavior are  normal.    ED Results / Procedures / Treatments   Labs (all labs ordered are listed, but only abnormal results are displayed) Labs Reviewed  COMPREHENSIVE METABOLIC PANEL - Abnormal; Notable for the following components:      Result Value   Calcium 8.1 (*)    Total Protein 5.9 (*)    Albumin 2.8 (*)    Anion gap 3 (*)    All other components within normal limits  CBC - Abnormal; Notable for the following components:   Hemoglobin 11.3 (*)    RDW 16.2 (*)    All other components within normal limits  URINALYSIS, ROUTINE W REFLEX MICROSCOPIC - Abnormal; Notable for the following components:   Color, Urine STRAW (*)    APPearance CLEAR (*)    Hgb urine dipstick SMALL (*)    All other components within normal limits  LIPASE, BLOOD  POC URINE PREG, ED  POC URINE PREG, ED     EKG     RADIOLOGY Please see ED Course for my review and interpretation.  I personally reviewed all radiographic images ordered to evaluate for the above acute complaints and reviewed radiology reports and findings.  These findings were personally discussed  with the patient.  Please see medical record for radiology report.    PROCEDURES:  Critical Care performed:   Procedures   MEDICATIONS ORDERED IN ED: Medications  morphine (PF) 4 MG/ML injection 4 mg (4 mg Intravenous Given 11/02/22 2234)  ondansetron (ZOFRAN) injection 4 mg (4 mg Intravenous Given 11/02/22 2235)     IMPRESSION / MDM / ASSESSMENT AND PLAN / ED COURSE  I reviewed the triage vital signs and the nursing notes.                              Differential diagnosis includes, but is not limited to, fibroid uterus, DU B, AB, PID, TOA, cystitis, torsion, cyst, ectopic  Patient presenting to the ER for evaluation of symptoms as described above.  Based on symptoms, risk factors and considered above differential, this presenting complaint could reflect a potentially life-threatening illness therefore the patient will be placed on  continuous pulse oximetry and telemetry for monitoring.  Laboratory evaluation will be sent to evaluate for the above complaints.  Have a lower suspicion for infectious process given lack of fever temperature.  High suspicion for fibroid uterus may be given worsening pain will order ultrasound to rule out more significant mass or ovarian cyst or other pathology.  Will give IV morphine for pain.   Clinical Course as of 11/02/22 2311  Thu Nov 02, 2022  2310 Patient will be signed out to oncoming physician pending follow-up results of ultrasound. [PR]    Clinical Course User Index [PR] Willy Eddy, MD     FINAL CLINICAL IMPRESSION(S) / ED DIAGNOSES   Final diagnoses:  Pelvic pain     Rx / DC Orders   ED Discharge Orders     None        Note:  This document was prepared using Dragon voice recognition software and may include unintentional dictation errors.    Willy Eddy, MD 11/02/22 [phone]

## 2022-11-02 NOTE — Telephone Encounter (Signed)
Patient transferred to triage line reporting of lower abdominal/pelvic pain, patient stated that she was prescribed medication to help control pain of fibroids but it has not helped. Patient has a scheduled appointment with Dr. Valentino Saxon on 11/10/22, patient states that pain is sever and she describes pain as sharp and stabbing. Patient denies symptoms of nausea or vomiting but reports constipation and states that pain is so severe she is having hard time doing daily activities. I strongly encouraged patient to return back to Ed this morning for medical evaluation and assessment of pain, patient stats that she was seen in ED for this matter on 10/31/22 and would not be returning back to ED today. Patient is requesting that Dr. Valentino Saxon see her sooner, I was advised by Tresa Moore that there is no sooner appointment. Please advise. KW

## 2022-11-02 NOTE — ED Triage Notes (Signed)
Pt to ED for continued abd pain for 4 days ago. Seen recently for same, dx with possible fibroids, d/c with pains. Reports pain meds not helping and sent by Dr Valentino Saxon.  Pt in NAD

## 2022-11-02 NOTE — Telephone Encounter (Signed)
Spoke with patient on phone and advised as below she states that she cannot come in May 1st because she will be at her mothers [employer]. Patient states that she was prescribed Hydrocodone in hospital and was advised to alternated with tylenol for pain relief, patient states that pain is so severe that medication is not helping. I advised patient that if pain is severe and not controlled on pain medication to return back to ED for evaluation patient has declined. Please advise. KW

## 2022-11-03 MED ORDER — OXYCODONE-ACETAMINOPHEN 5-325 MG PO TABS: 1.0000 | ORAL_TABLET | Freq: Four times a day (QID) | ORAL | 0 refills | Status: DC | PRN

## 2022-11-03 MED ORDER — ACETAMINOPHEN 500 MG PO TABS
1000.0000 mg | ORAL_TABLET | Freq: Once | ORAL | Status: AC
  Administered 2022-11-03: 1000 mg via ORAL
  Filled 2022-11-03: qty 2

## 2022-11-03 MED ORDER — OXYCODONE HCL 5 MG PO TABS
5.0000 mg | ORAL_TABLET | Freq: Once | ORAL | Status: AC
  Administered 2022-11-03: 5 mg via ORAL
  Filled 2022-11-03: qty 1

## 2022-11-03 MED ORDER — IBUPROFEN 800 MG PO TABS: 800.0000 mg | ORAL_TABLET | Freq: Three times a day (TID) | ORAL | 1 refills | Status: DC | PRN

## 2022-11-03 MED ORDER — IBUPROFEN 600 MG PO TABS
600.0000 mg | ORAL_TABLET | Freq: Once | ORAL | Status: AC
  Administered 2022-11-03: 600 mg via ORAL
  Filled 2022-11-03: qty 1

## 2022-11-03 NOTE — Discharge Instructions (Addendum)
Your ultrasound showed a fibroid in your uterus. Please follow up with your gynecologist as scheduled and take previously prescribed pain medications.

## 2022-11-03 NOTE — Telephone Encounter (Signed)
Patient has been advised. KW 

## 2022-11-03 NOTE — Telephone Encounter (Signed)
Please inform patient that there are any cancellations between now and her scheduled appointment we will work to get her in sooner.  I can send in a prescription for something stronger for her pain and she can alternated with the prescription Ibuprofen high dose that I am sending in as well.  Patient has also been advised to stop the Myfembree at this time.

## 2022-11-03 NOTE — ED Provider Notes (Signed)
Signout received on this patient with pelvic pain and known fibroids, pending TVUS to r/o torsion - US shows fibroid. Plan for dc and gyn f/u   Pilar Jarvis, MD 11/03/22 [phone]

## 2022-11-08 DIAGNOSIS — D259 Leiomyoma of uterus, unspecified: Secondary | ICD-10-CM

## 2022-11-08 HISTORY — DX: Leiomyoma of uterus, unspecified: D25.9

## 2022-11-09 NOTE — Progress Notes (Signed)
GYNECOLOGY PREOPERATIVE HISTORY AND PHYSICAL   Subjective:  Nicole Gross is a 37 y.o. G6P0050 here for surgical management of uterine fibroids, irregular heavy menses, and pelvic pain. Has had worsening pain and heavier bleeding over the past year. Also has a history of anemia. She was given a prescription for Myfembree last month to help control her bleeding and fibroids, however notes that since taking this, her bleeding did improve but her pain became worse.  Has had 1 ER visit over the past month due to severe pain. Declines definitive management with hysterectomy.  No significant preoperative concerns.  Proposed surgery:  Laparoscopic laser ablation of uterine fibroids.    Pertinent Gynecological History: Menses: Period Cycle (Days): 28 Period Duration (Days): 5 Period Pattern: (!) Irregular Menstrual Flow: Heavy Menstrual Control: Maxi pad Menstrual Control Change Freq (Hours): 1-2 Dysmenorrhea: (!) Severe Dysmenorrhea Symptoms: Cramping, Throbbing, Headache, Nausea    Contraception: none Last mammogram: Not age appropriate Last pap: normal Date: 09/15/2022   Past Medical History:  Diagnosis Date   Asthma    Ectopic pregnancy    x 2   Pelvic inflammatory disease (PID)     Past Surgical History:  Procedure Laterality Date   DIAGNOSTIC LAPAROSCOPY WITH REMOVAL OF ECTOPIC PREGNANCY Right 12/19/2020   Procedure: DIAGNOSTIC LAPAROSCOPY WITH REMOVAL OF ECTOPIC PREGNANCY;  Surgeon: Hildred Laser, MD;  Location: [facility] ORS;  Service: Gynecology;  Laterality: Right;   ECTOPIC PREGNANCY SURGERY     h/o 1 ectopic on each side    OB History  Gravida Para Term Preterm AB Living  6 0     5    SAB IAB Ectopic Multiple Live Births  3   2        # Outcome Date GA Lbr Len/2nd Weight Sex Delivery Anes PTL Lv  6 Gravida           5 SAB           4 SAB           3 SAB           2 Ectopic           1 Ectopic             Family History  Problem Relation Age of Onset    Hypertension Mother    Diabetes Mother    Asthma Mother    Hypertension Father    Diabetes Father    Gout Father    Asthma Sister    Hypertension Maternal Grandmother    Gout Paternal Grandmother    Alzheimer's disease Paternal Grandmother    Heart failure Paternal Grandmother     Social History   Socioeconomic History   Marital status: Single    Spouse name: Not on file   Number of children: 0   Years of education: 12   Highest education level: Not on file  Occupational History    Comment: unemployed  Tobacco Use   Smoking status: Every Day    Packs/day: 0.50    Years: 10.00    Additional pack years: 0.00    Total pack years: 5.00    Types: Cigarettes, Cigars   Smokeless tobacco: Never  Substance and Sexual Activity   Alcohol use: Yes    Alcohol/week: 3.0 standard drinks of alcohol    Types: 3 Cans of beer per week    Comment: last use 11/24/21 "occassionally"   Drug use: Yes    Types: Marijuana  Comment: last use 11/29/21   Sexual activity: Yes    Partners: Male  Other Topics Concern   Not on file  Social History Narrative   Not on file   Social Determinants of Health   Financial Resource Strain: Not on file  Food Insecurity: Not on file  Transportation Needs: Not on file  Physical Activity: Not on file  Stress: Not on file  Social Connections: Not on file  Intimate Partner Violence: Not At Risk (01/22/2020)   Humiliation, Afraid, Rape, and Kick questionnaire    Fear of Current or Ex-Partner: No    Emotionally Abused: No    Physically Abused: No    Sexually Abused: No    Current Outpatient Medications on File Prior to Visit  Medication Sig Dispense Refill   albuterol (VENTOLIN HFA) 108 (90 Base) MCG/ACT inhaler Inhale into the lungs every 6 (six) hours as needed for wheezing or shortness of breath.     albuterol (VENTOLIN HFA) 108 (90 Base) MCG/ACT inhaler Inhale 2 puffs into the lungs every 6 (six) hours as needed for wheezing or shortness of breath.  8 g 2   benzonatate (TESSALON PERLES) 100 MG capsule Take 1 capsule (100 mg total) by mouth 3 (three) times daily as needed for cough. 30 capsule 0   ibuprofen (ADVIL) 800 MG tablet Take 1 tablet (800 mg total) by mouth every 8 (eight) hours as needed for mild pain or moderate pain. 30 tablet 1   ipratropium-albuterol (DUONEB) 0.5-2.5 (3) MG/3ML SOLN Take 3 mLs by nebulization every 4 (four) hours as needed. 360 mL 0   Multiple Vitamins-Minerals (ADULT ONE DAILY GUMMIES PO) Take 1 tablet by mouth in the morning.     oxyCODONE-acetaminophen (PERCOCET) 5-325 MG tablet Take 1-2 tablets by mouth every 6 (six) hours as needed for severe pain or moderate pain. 30 tablet 0   Relugolix-Estradiol-Norethind (MYFEMBREE) 40-1-0.5 MG TABS Take 1 tablet by mouth daily. 28 tablet 11   Spacer/Aero Chamber Mouthpiece MISC 1 Units by Does not apply route every 4 (four) hours as needed (wheezing). 1 each 0   No current facility-administered medications on file prior to visit.   Allergies  Allergen Reactions   Amoxicillin Anaphylaxis and Rash   Ciprofloxacin Shortness Of Breath and Itching   Penicillins Anaphylaxis and Rash    Has patient had a PCN reaction causing immediate rash, facial/tongue/throat swelling, SOB or lightheadedness with hypotension: yes: Has patient had a PCN reaction causing severe rash involving mucus membranes or skin necrosis: no Has patient had a PCN reaction that required hospitalization no Has patient had a PCN reaction occurring within the last 10 years: no1} If all of the above answers are "NO", then may proceed with Cephalosporin use.    Codeine Itching     Review of Systems Constitutional: No recent fever/chills/sweats Respiratory: No recent cough/bronchitis Cardiovascular: No chest pain Gastrointestinal: No recent nausea/vomiting/diarrhea Genitourinary: No UTI symptoms Hematologic/lymphatic:No history of coagulopathy or recent blood thinner use    Objective:   Blood  pressure 123/66, pulse 74, height 4\' 11"  (1.499 m), weight 167 lb 14.4 oz (76.2 kg), last menstrual period 09/30/2022, unknown if currently breastfeeding. CONSTITUTIONAL: Well-developed, well-nourished female in no acute distress.  HENT:  Normocephalic, atraumatic, External right and left ear normal. Oropharynx is clear and moist EYES: Conjunctivae and EOM are normal. Pupils are equal, round, and reactive to light. No scleral icterus.  NECK: Normal range of motion, supple, no masses SKIN: Skin is warm and dry. No rash noted.  Not diaphoretic. No erythema. No pallor. NEUROLOGIC: Alert and oriented to person, place, and time. Normal reflexes, muscle tone coordination. No cranial nerve deficit noted. PSYCHIATRIC: Normal mood and affect. Normal behavior. Normal judgment and thought content. CARDIOVASCULAR: Normal heart rate noted, regular rhythm RESPIRATORY: Effort and breath sounds normal, no problems with respiration noted ABDOMEN: Soft, nontender, nondistended. PELVIC: Deferred MUSCULOSKELETAL: Normal range of motion. No edema and no tenderness. 2+ distal pulses.    Labs: Results for orders placed or performed during the hospital encounter of 11/02/22 (from the past 336 hour(s))  Lipase, blood   Collection Time: 11/02/22  6:23 PM  Result Value Ref Range   Lipase 32 11 - 51 U/L  Comprehensive metabolic panel   Collection Time: 11/02/22  6:23 PM  Result Value Ref Range   Sodium 138 135 - 145 mmol/L   Potassium 3.7 3.5 - 5.1 mmol/L   Chloride 107 98 - 111 mmol/L   CO2 28 22 - 32 mmol/L   Glucose, Bld 95 70 - 99 mg/dL   BUN 10 6 - 20 mg/dL   Creatinine, Ser 1.61 0.44 - 1.00 mg/dL   Calcium 8.1 (L) 8.9 - 10.3 mg/dL   Total Protein 5.9 (L) 6.5 - 8.1 g/dL   Albumin 2.8 (L) 3.5 - 5.0 g/dL   AST 26 15 - 41 U/L   ALT 14 0 - 44 U/L   Alkaline Phosphatase 62 38 - 126 U/L   Total Bilirubin 0.3 0.3 - 1.2 mg/dL   GFR, Estimated >09 >60 mL/min   Anion gap 3 (L) 5 - 15  CBC   Collection Time:  11/02/22  6:23 PM  Result Value Ref Range   WBC 10.3 4.0 - 10.5 K/uL   RBC 4.20 3.87 - 5.11 MIL/uL   Hemoglobin 11.3 (L) 12.0 - 15.0 g/dL   HCT 45.4 09.8 - 11.9 %   MCV 86.0 80.0 - 100.0 fL   MCH 26.9 26.0 - 34.0 pg   MCHC 31.3 30.0 - 36.0 g/dL   RDW 14.7 (H) 82.9 - 56.2 %   Platelets 308 150 - 400 K/uL   nRBC 0.0 0.0 - 0.2 %  Urinalysis, Routine w reflex microscopic -Urine, Clean Catch   Collection Time: 11/02/22 10:38 PM  Result Value Ref Range   Color, Urine STRAW (A) YELLOW   APPearance CLEAR (A) CLEAR   Specific Gravity, Urine 1.006 1.005 - 1.030   pH 6.0 5.0 - 8.0   Glucose, UA NEGATIVE NEGATIVE mg/dL   Hgb urine dipstick SMALL (A) NEGATIVE   Bilirubin Urine NEGATIVE NEGATIVE   Ketones, ur NEGATIVE NEGATIVE mg/dL   Protein, ur NEGATIVE NEGATIVE mg/dL   Nitrite NEGATIVE NEGATIVE   Leukocytes,Ua NEGATIVE NEGATIVE   RBC / HPF 0-5 0 - 5 RBC/hpf   WBC, UA 0-5 0 - 5 WBC/hpf   Bacteria, UA NONE SEEN NONE SEEN   Squamous Epithelial / HPF 0-5 0 - 5 /HPF   Mucus PRESENT   POC Urine Pregnancy, ED   Collection Time: 11/02/22 10:39 PM  Result Value Ref Range   Preg Test, Ur Negative Negative  Results for orders placed or performed during the hospital encounter of 10/31/22 (from the past 336 hour(s))  CBC with Differential   Collection Time: 10/31/22  7:43 PM  Result Value Ref Range   WBC 13.0 (H) 4.0 - 10.5 K/uL   RBC 5.03 3.87 - 5.11 MIL/uL   Hemoglobin 13.4 12.0 - 15.0 g/dL   HCT 13.0 86.5 - 78.4 %  MCV 85.7 80.0 - 100.0 fL   MCH 26.6 26.0 - 34.0 pg   MCHC 31.1 30.0 - 36.0 g/dL   RDW 40.9 (H) 81.1 - 91.4 %   Platelets 294 150 - 400 K/uL   nRBC 0.0 0.0 - 0.2 %   Neutrophils Relative % 73 %   Neutro Abs 9.5 (H) 1.7 - 7.7 K/uL   Lymphocytes Relative 17 %   Lymphs Abs 2.2 0.7 - 4.0 K/uL   Monocytes Relative 5 %   Monocytes Absolute 0.7 0.1 - 1.0 K/uL   Eosinophils Relative 4 %   Eosinophils Absolute 0.5 0.0 - 0.5 K/uL   Basophils Relative 1 %   Basophils Absolute 0.1  0.0 - 0.1 K/uL   Immature Granulocytes 0 %   Abs Immature Granulocytes 0.05 0.00 - 0.07 K/uL  Comprehensive metabolic panel   Collection Time: 10/31/22  7:43 PM  Result Value Ref Range   Sodium 142 135 - 145 mmol/L   Potassium 3.8 3.5 - 5.1 mmol/L   Chloride 110 98 - 111 mmol/L   CO2 26 22 - 32 mmol/L   Glucose, Bld 100 (H) 70 - 99 mg/dL   BUN 14 6 - 20 mg/dL   Creatinine, Ser 7.82 0.44 - 1.00 mg/dL   Calcium 8.2 (L) 8.9 - 10.3 mg/dL   Total Protein 5.8 (L) 6.5 - 8.1 g/dL   Albumin 3.1 (L) 3.5 - 5.0 g/dL   AST 48 (H) 15 - 41 U/L   ALT 17 0 - 44 U/L   Alkaline Phosphatase 70 38 - 126 U/L   Total Bilirubin 0.3 0.3 - 1.2 mg/dL   GFR, Estimated >95 >62 mL/min   Anion gap 6 5 - 15  Urinalysis, Routine w reflex microscopic -Urine, Clean Catch   Collection Time: 10/31/22  7:44 PM  Result Value Ref Range   Color, Urine YELLOW (A) YELLOW   APPearance CLOUDY (A) CLEAR   Specific Gravity, Urine 1.029 1.005 - 1.030   pH 5.0 5.0 - 8.0   Glucose, UA NEGATIVE NEGATIVE mg/dL   Hgb urine dipstick NEGATIVE NEGATIVE   Bilirubin Urine NEGATIVE NEGATIVE   Ketones, ur NEGATIVE NEGATIVE mg/dL   Protein, ur NEGATIVE NEGATIVE mg/dL   Nitrite NEGATIVE NEGATIVE   Leukocytes,Ua TRACE (A) NEGATIVE   RBC / HPF 6-10 0 - 5 RBC/hpf   WBC, UA 6-10 0 - 5 WBC/hpf   Bacteria, UA RARE (A) NONE SEEN   Squamous Epithelial / HPF 11-20 0 - 5 /HPF   Mucus PRESENT   POC Urine Pregnancy, ED   Collection Time: 10/31/22  8:22 PM  Result Value Ref Range   Preg Test, Ur Negative Negative  Chlamydia/NGC rt PCR ([facility] only)   Collection Time: 11/01/22 12:25 AM   Specimen: Cervical/Vaginal swab  Result Value Ref Range   Specimen source GC/Chlam ENDOCERVICAL    Chlamydia Tr NOT DETECTED NOT DETECTED   N gonorrhoeae NOT DETECTED NOT DETECTED  Wet prep, genital   Collection Time: 11/01/22 12:25 AM   Specimen: Cervical/Vaginal swab  Result Value Ref Range   Yeast Wet Prep HPF POC NONE SEEN NONE SEEN   Trich, Wet  Prep NONE SEEN NONE SEEN   Clue Cells Wet Prep HPF POC PRESENT (A) NONE SEEN   WBC, Wet Prep HPF POC <10 <10   Sperm NONE SEEN      Imaging Studies: US PELVIC COMPLETE W TRANSVAGINAL AND TORSION R/O  Result Date: 11/03/2022 CLINICAL DATA:  Acute pelvic pain. EXAM: TRANSABDOMINAL  AND TRANSVAGINAL ULTRASOUND OF PELVIS DOPPLER ULTRASOUND OF OVARIES TECHNIQUE: Both transabdominal and transvaginal ultrasound examinations of the pelvis were performed. Transabdominal technique was performed for global imaging of the pelvis including uterus, ovaries, adnexal regions, and pelvic cul-de-sac. It was necessary to proceed with endovaginal exam following the transabdominal exam to visualize the uterus, endometrium, bilateral ovaries and bilateral adnexa. Color and duplex Doppler ultrasound was utilized to evaluate blood flow to the ovaries. COMPARISON:  None Available. FINDINGS: Uterus Measurements: 13.7 cm x 7.3 cm x 8.8 cm = volume: 459.17 mL. Multiple heterogeneous uterine fibroids are seen. The largest measures 8.3 cm x 6.6 cm x 6.3 cm and is seen along the posterior aspect of the uterus. 3.1 cm x 3.6 cm x 3.0 cm and 3.2 cm x 3.1 cm x 2.7 cm uterine fibroids are also seen. Endometrium Thickness: 7.3 mm.  No focal abnormality visualized. Right ovary Measurements: 2.3 cm x 1.5 cm x 2.9 cm = volume: 5.3 mL. Normal appearance/no adnexal mass. Left ovary Measurements: 3.2 cm x 1.7 cm x 2.8 cm = volume: 7.6 mL. Normal appearance/no adnexal mass. Pulsed Doppler evaluation of both ovaries demonstrates normal low-resistance arterial and venous waveforms. Other findings A small amount of pelvic free fluid is seen. IMPRESSION: Large heterogeneous uterine fibroids. Electronically Signed   By: Aram Candela M.D.   On: 11/03/2022 00:39     Assessment:    1. Fibroids   2. Menorrhagia with irregular cycle   3. Pelvic pain      Plan:   Counseling: Procedure, risks, reasons, benefits and complications (including  injury to bowel, bladder, major blood vessel, ureter, bleeding, possibility of transfusion, infection, or fistula formation) reviewed in detail. Likelihood of success in alleviating the patient's condition was discussed. Routine postoperative instructions will be reviewed with the patient and her family in detail after surgery.  The patient concurred with the proposed plan, giving informed written consent for the surgery.  Surgery scheduled for 11/27/2022.  Preop testing ordered. Instructions reviewed, including NPO after midnight.    Hildred Laser, MD  [facility] [place]

## 2022-11-10 ENCOUNTER — Ambulatory Visit (INDEPENDENT_AMBULATORY_CARE_PROVIDER_SITE_OTHER): Payer: Medicaid Other | Admitting: Obstetrics and Gynecology

## 2022-11-10 ENCOUNTER — Encounter: Payer: Self-pay | Admitting: Obstetrics and Gynecology

## 2022-11-10 VITALS — BP 123/66 | HR 74 | Ht 59.0 in | Wt 167.9 lb

## 2022-11-10 DIAGNOSIS — N921 Excessive and frequent menstruation with irregular cycle: Secondary | ICD-10-CM

## 2022-11-10 DIAGNOSIS — R102 Pelvic and perineal pain: Secondary | ICD-10-CM

## 2022-11-10 DIAGNOSIS — Z01818 Encounter for other preprocedural examination: Secondary | ICD-10-CM

## 2022-11-10 DIAGNOSIS — D219 Benign neoplasm of connective and other soft tissue, unspecified: Secondary | ICD-10-CM

## 2022-11-11 ENCOUNTER — Encounter: Payer: Self-pay | Admitting: Obstetrics and Gynecology

## 2022-11-11 NOTE — H&P (Signed)
GYNECOLOGY PREOPERATIVE HISTORY AND PHYSICAL   Subjective:  Nicole Gross is a 37 y.o. G6P0050 here for surgical management of uterine fibroids, irregular heavy menses, and pelvic pain. Has had worsening pain and heavier bleeding over the past year. Also has a history of anemia. She was given a prescription for Myfembree last month to help control her bleeding and fibroids, however notes that since taking this, her bleeding did improve but her pain became worse.  Has had 1 ER visit over the past month due to severe pain. Declines definitive management with hysterectomy.  No significant preoperative concerns.  Proposed surgery:  Laparoscopic laser ablation of uterine fibroids.    Pertinent Gynecological History: Menses: Period Cycle (Days): 28 Period Duration (Days): 5 Period Pattern: (!) Irregular Menstrual Flow: Heavy Menstrual Control: Maxi pad Menstrual Control Change Freq (Hours): 1-2 Dysmenorrhea: (!) Severe Dysmenorrhea Symptoms: Cramping, Throbbing, Headache, Nausea    Contraception: none Last mammogram: Not age appropriate Last pap: normal Date: 09/15/2022   Past Medical History:  Diagnosis Date   Asthma    Ectopic pregnancy    x 3   Pelvic inflammatory disease (PID)     Past Surgical History:  Procedure Laterality Date   DIAGNOSTIC LAPAROSCOPY WITH REMOVAL OF ECTOPIC PREGNANCY Right 12/19/2020   Procedure: DIAGNOSTIC LAPAROSCOPY WITH REMOVAL OF ECTOPIC PREGNANCY;  Surgeon: Hildred Laser, MD;  Location: [facility];  Service: Gynecology;  Laterality: Right;   ECTOPIC PREGNANCY SURGERY     h/o 1 ectopic on each side    OB History  Gravida Para Term Preterm AB Living  6 0     5    SAB IAB Ectopic Multiple Live Births  3   2        # Outcome Date GA Lbr Len/2nd Weight Sex Delivery Anes PTL Lv  6 Gravida           5 SAB           4 SAB           3 SAB           2 Ectopic           1 Ectopic             Family History  Problem Relation Age of Onset    Hypertension Mother    Diabetes Mother    Asthma Mother    Hypertension Father    Diabetes Father    Gout Father    Asthma Sister    Hypertension Maternal Grandmother    Gout Paternal Grandmother    Alzheimer's disease Paternal Grandmother    Heart failure Paternal Grandmother     Social History   Socioeconomic History   Marital status: Single    Spouse name: Not on file   Number of children: 0   Years of education: 12   Highest education level: Not on file  Occupational History    Comment: unemployed  Tobacco Use   Smoking status: Every Day    Packs/day: 0.50    Years: 10.00    Additional pack years: 0.00    Total pack years: 5.00    Types: Cigarettes, Cigars   Smokeless tobacco: Never  Substance and Sexual Activity   Alcohol use: Yes    Alcohol/week: 3.0 standard drinks of alcohol    Types: 3 Cans of beer per week    Comment: last use 11/24/21 "occassionally"   Drug use: Yes    Types: Marijuana  Comment: last use 11/29/21   Sexual activity: Yes    Partners: Male  Other Topics Concern   Not on file  Social History Narrative   Not on file   Social Determinants of Health   Financial Resource Strain: Not on file  Food Insecurity: Not on file  Transportation Needs: Not on file  Physical Activity: Not on file  Stress: Not on file  Social Connections: Not on file  Intimate Partner Violence: Not At Risk (01/22/2020)   Humiliation, Afraid, Rape, and Kick questionnaire    Fear of Current or Ex-Partner: No    Emotionally Abused: No    Physically Abused: No    Sexually Abused: No    Current Outpatient Medications on File Prior to Visit  Medication Sig Dispense Refill   albuterol (VENTOLIN HFA) 108 (90 Base) MCG/ACT inhaler Inhale into the lungs every 6 (six) hours as needed for wheezing or shortness of breath.     albuterol (VENTOLIN HFA) 108 (90 Base) MCG/ACT inhaler Inhale 2 puffs into the lungs every 6 (six) hours as needed for wheezing or shortness of breath.  8 g 2   benzonatate (TESSALON PERLES) 100 MG capsule Take 1 capsule (100 mg total) by mouth 3 (three) times daily as needed for cough. 30 capsule 0   ibuprofen (ADVIL) 800 MG tablet Take 1 tablet (800 mg total) by mouth every 8 (eight) hours as needed for mild pain or moderate pain. 30 tablet 1   ipratropium-albuterol (DUONEB) 0.5-2.5 (3) MG/3ML SOLN Take 3 mLs by nebulization every 4 (four) hours as needed. 360 mL 0   Multiple Vitamins-Minerals (ADULT ONE DAILY GUMMIES PO) Take 1 tablet by mouth in the morning.     oxyCODONE-acetaminophen (PERCOCET) 5-325 MG tablet Take 1-2 tablets by mouth every 6 (six) hours as needed for severe pain or moderate pain. 30 tablet 0   Relugolix-Estradiol-Norethind (MYFEMBREE) 40-1-0.5 MG TABS Take 1 tablet by mouth daily. 28 tablet 11   Spacer/Aero Chamber Mouthpiece MISC 1 Units by Does not apply route every 4 (four) hours as needed (wheezing). 1 each 0   No current facility-administered medications on file prior to visit.   Allergies  Allergen Reactions   Amoxicillin Anaphylaxis and Rash   Ciprofloxacin Shortness Of Breath and Itching   Penicillins Anaphylaxis and Rash    Has patient had a PCN reaction causing immediate rash, facial/tongue/throat swelling, SOB or lightheadedness with hypotension: yes: Has patient had a PCN reaction causing severe rash involving mucus membranes or skin necrosis: no Has patient had a PCN reaction that required hospitalization no Has patient had a PCN reaction occurring within the last 10 years: no1} If all of the above answers are "NO", then may proceed with Cephalosporin use.    Codeine Itching     Review of Systems Constitutional: No recent fever/chills/sweats Respiratory: No recent cough/bronchitis Cardiovascular: No chest pain Gastrointestinal: No recent nausea/vomiting/diarrhea Genitourinary: No UTI symptoms Hematologic/lymphatic:No history of coagulopathy or recent blood thinner use    Objective:   Blood  pressure 123/66, pulse 74, height 4\' 11"  (1.499 m), weight 167 lb 14.4 oz (76.2 kg), last menstrual period 09/30/2022, unknown if currently breastfeeding. CONSTITUTIONAL: Well-developed, well-nourished female in no acute distress.  HENT:  Normocephalic, atraumatic, External right and left ear normal. Oropharynx is clear and moist EYES: Conjunctivae and EOM are normal. Pupils are equal, round, and reactive to light. No scleral icterus.  NECK: Normal range of motion, supple, no masses SKIN: Skin is warm and dry. No rash noted.  Not diaphoretic. No erythema. No pallor. NEUROLOGIC: Alert and oriented to person, place, and time. Normal reflexes, muscle tone coordination. No cranial nerve deficit noted. PSYCHIATRIC: Normal mood and affect. Normal behavior. Normal judgment and thought content. CARDIOVASCULAR: Normal heart rate noted, regular rhythm RESPIRATORY: Effort and breath sounds normal, no problems with respiration noted ABDOMEN: Soft, nontender, nondistended. PELVIC: Deferred MUSCULOSKELETAL: Normal range of motion. No edema and no tenderness. 2+ distal pulses.    Labs: Results for orders placed or performed during the hospital encounter of 11/02/22 (from the past 336 hour(s))  Lipase, blood   Collection Time: 11/02/22  6:23 PM  Result Value Ref Range   Lipase 32 11 - 51 U/L  Comprehensive metabolic panel   Collection Time: 11/02/22  6:23 PM  Result Value Ref Range   Sodium 138 135 - 145 mmol/L   Potassium 3.7 3.5 - 5.1 mmol/L   Chloride 107 98 - 111 mmol/L   CO2 28 22 - 32 mmol/L   Glucose, Bld 95 70 - 99 mg/dL   BUN 10 6 - 20 mg/dL   Creatinine, Ser 1.61 0.44 - 1.00 mg/dL   Calcium 8.1 (L) 8.9 - 10.3 mg/dL   Total Protein 5.9 (L) 6.5 - 8.1 g/dL   Albumin 2.8 (L) 3.5 - 5.0 g/dL   AST 26 15 - 41 U/L   ALT 14 0 - 44 U/L   Alkaline Phosphatase 62 38 - 126 U/L   Total Bilirubin 0.3 0.3 - 1.2 mg/dL   GFR, Estimated >09 >60 mL/min   Anion gap 3 (L) 5 - 15  CBC   Collection Time:  11/02/22  6:23 PM  Result Value Ref Range   WBC 10.3 4.0 - 10.5 K/uL   RBC 4.20 3.87 - 5.11 MIL/uL   Hemoglobin 11.3 (L) 12.0 - 15.0 g/dL   HCT 45.4 09.8 - 11.9 %   MCV 86.0 80.0 - 100.0 fL   MCH 26.9 26.0 - 34.0 pg   MCHC 31.3 30.0 - 36.0 g/dL   RDW 14.7 (H) 82.9 - 56.2 %   Platelets 308 150 - 400 K/uL   nRBC 0.0 0.0 - 0.2 %  Urinalysis, Routine w reflex microscopic -Urine, Clean Catch   Collection Time: 11/02/22 10:38 PM  Result Value Ref Range   Color, Urine STRAW (A) YELLOW   APPearance CLEAR (A) CLEAR   Specific Gravity, Urine 1.006 1.005 - 1.030   pH 6.0 5.0 - 8.0   Glucose, UA NEGATIVE NEGATIVE mg/dL   Hgb urine dipstick SMALL (A) NEGATIVE   Bilirubin Urine NEGATIVE NEGATIVE   Ketones, ur NEGATIVE NEGATIVE mg/dL   Protein, ur NEGATIVE NEGATIVE mg/dL   Nitrite NEGATIVE NEGATIVE   Leukocytes,Ua NEGATIVE NEGATIVE   RBC / HPF 0-5 0 - 5 RBC/hpf   WBC, UA 0-5 0 - 5 WBC/hpf   Bacteria, UA NONE SEEN NONE SEEN   Squamous Epithelial / HPF 0-5 0 - 5 /HPF   Mucus PRESENT   POC Urine Pregnancy, ED   Collection Time: 11/02/22 10:39 PM  Result Value Ref Range   Preg Test, Ur Negative Negative  Results for orders placed or performed during the hospital encounter of 10/31/22 (from the past 336 hour(s))  CBC with Differential   Collection Time: 10/31/22  7:43 PM  Result Value Ref Range   WBC 13.0 (H) 4.0 - 10.5 K/uL   RBC 5.03 3.87 - 5.11 MIL/uL   Hemoglobin 13.4 12.0 - 15.0 g/dL   HCT 13.0 86.5 - 78.4 %  MCV 85.7 80.0 - 100.0 fL   MCH 26.6 26.0 - 34.0 pg   MCHC 31.1 30.0 - 36.0 g/dL   RDW 16.1 (H) 09.6 - 04.5 %   Platelets 294 150 - 400 K/uL   nRBC 0.0 0.0 - 0.2 %   Neutrophils Relative % 73 %   Neutro Abs 9.5 (H) 1.7 - 7.7 K/uL   Lymphocytes Relative 17 %   Lymphs Abs 2.2 0.7 - 4.0 K/uL   Monocytes Relative 5 %   Monocytes Absolute 0.7 0.1 - 1.0 K/uL   Eosinophils Relative 4 %   Eosinophils Absolute 0.5 0.0 - 0.5 K/uL   Basophils Relative 1 %   Basophils Absolute 0.1  0.0 - 0.1 K/uL   Immature Granulocytes 0 %   Abs Immature Granulocytes 0.05 0.00 - 0.07 K/uL  Comprehensive metabolic panel   Collection Time: 10/31/22  7:43 PM  Result Value Ref Range   Sodium 142 135 - 145 mmol/L   Potassium 3.8 3.5 - 5.1 mmol/L   Chloride 110 98 - 111 mmol/L   CO2 26 22 - 32 mmol/L   Glucose, Bld 100 (H) 70 - 99 mg/dL   BUN 14 6 - 20 mg/dL   Creatinine, Ser 4.09 0.44 - 1.00 mg/dL   Calcium 8.2 (L) 8.9 - 10.3 mg/dL   Total Protein 5.8 (L) 6.5 - 8.1 g/dL   Albumin 3.1 (L) 3.5 - 5.0 g/dL   AST 48 (H) 15 - 41 U/L   ALT 17 0 - 44 U/L   Alkaline Phosphatase 70 38 - 126 U/L   Total Bilirubin 0.3 0.3 - 1.2 mg/dL   GFR, Estimated >81 >19 mL/min   Anion gap 6 5 - 15  Urinalysis, Routine w reflex microscopic -Urine, Clean Catch   Collection Time: 10/31/22  7:44 PM  Result Value Ref Range   Color, Urine YELLOW (A) YELLOW   APPearance CLOUDY (A) CLEAR   Specific Gravity, Urine 1.029 1.005 - 1.030   pH 5.0 5.0 - 8.0   Glucose, UA NEGATIVE NEGATIVE mg/dL   Hgb urine dipstick NEGATIVE NEGATIVE   Bilirubin Urine NEGATIVE NEGATIVE   Ketones, ur NEGATIVE NEGATIVE mg/dL   Protein, ur NEGATIVE NEGATIVE mg/dL   Nitrite NEGATIVE NEGATIVE   Leukocytes,Ua TRACE (A) NEGATIVE   RBC / HPF 6-10 0 - 5 RBC/hpf   WBC, UA 6-10 0 - 5 WBC/hpf   Bacteria, UA RARE (A) NONE SEEN   Squamous Epithelial / HPF 11-20 0 - 5 /HPF   Mucus PRESENT   POC Urine Pregnancy, ED   Collection Time: 10/31/22  8:22 PM  Result Value Ref Range   Preg Test, Ur Negative Negative  Chlamydia/NGC rt PCR ([facility] only)   Collection Time: 11/01/22 12:25 AM   Specimen: Cervical/Vaginal swab  Result Value Ref Range   Specimen source GC/Chlam ENDOCERVICAL    Chlamydia Tr NOT DETECTED NOT DETECTED   N gonorrhoeae NOT DETECTED NOT DETECTED  Wet prep, genital   Collection Time: 11/01/22 12:25 AM   Specimen: Cervical/Vaginal swab  Result Value Ref Range   Yeast Wet Prep HPF POC NONE SEEN NONE SEEN   Trich, Wet  Prep NONE SEEN NONE SEEN   Clue Cells Wet Prep HPF POC PRESENT (A) NONE SEEN   WBC, Wet Prep HPF POC <10 <10   Sperm NONE SEEN      Imaging Studies: US PELVIC COMPLETE W TRANSVAGINAL AND TORSION R/O  Result Date: 11/03/2022 CLINICAL DATA:  Acute pelvic pain. EXAM: TRANSABDOMINAL  AND TRANSVAGINAL ULTRASOUND OF PELVIS DOPPLER ULTRASOUND OF OVARIES TECHNIQUE: Both transabdominal and transvaginal ultrasound examinations of the pelvis were performed. Transabdominal technique was performed for global imaging of the pelvis including uterus, ovaries, adnexal regions, and pelvic cul-de-sac. It was necessary to proceed with endovaginal exam following the transabdominal exam to visualize the uterus, endometrium, bilateral ovaries and bilateral adnexa. Color and duplex Doppler ultrasound was utilized to evaluate blood flow to the ovaries. COMPARISON:  None Available. FINDINGS: Uterus Measurements: 13.7 cm x 7.3 cm x 8.8 cm = volume: 459.17 mL. Multiple heterogeneous uterine fibroids are seen. The largest measures 8.3 cm x 6.6 cm x 6.3 cm and is seen along the posterior aspect of the uterus. 3.1 cm x 3.6 cm x 3.0 cm and 3.2 cm x 3.1 cm x 2.7 cm uterine fibroids are also seen. Endometrium Thickness: 7.3 mm.  No focal abnormality visualized. Right ovary Measurements: 2.3 cm x 1.5 cm x 2.9 cm = volume: 5.3 mL. Normal appearance/no adnexal mass. Left ovary Measurements: 3.2 cm x 1.7 cm x 2.8 cm = volume: 7.6 mL. Normal appearance/no adnexal mass. Pulsed Doppler evaluation of both ovaries demonstrates normal low-resistance arterial and venous waveforms. Other findings A small amount of pelvic free fluid is seen. IMPRESSION: Large heterogeneous uterine fibroids. Electronically Signed   By: Aram Candela M.D.   On: 11/03/2022 00:39     Assessment:    1. Fibroids   2. Menorrhagia with irregular cycle   3. Pelvic pain      Plan:   Counseling: Procedure, risks, reasons, benefits and complications (including  injury to bowel, bladder, major blood vessel, ureter, bleeding, possibility of transfusion, infection, or fistula formation) reviewed in detail. Likelihood of success in alleviating the patient's condition was discussed. Routine postoperative instructions will be reviewed with the patient and her family in detail after surgery.  The patient concurred with the proposed plan, giving informed written consent for the surgery.  Surgery scheduled for 11/27/2022.  Preop testing ordered. Instructions reviewed, including NPO after midnight.    Hildred Laser, MD [facility]

## 2022-11-11 NOTE — H&P (View-Only) (Signed)
  GYNECOLOGY PREOPERATIVE HISTORY AND PHYSICAL   Subjective:  Nicole Gross is a 37 y.o. G6P0050 here for surgical management of uterine fibroids, irregular heavy menses, and pelvic pain. Has had worsening pain and heavier bleeding over the past year. Also has a history of anemia. She was given a prescription for Myfembree last month to help control her bleeding and fibroids, however notes that since taking this, her bleeding did improve but her pain became worse.  Has had 1 ER visit over the past month due to severe pain. Declines definitive management with hysterectomy.  No significant preoperative concerns.  Proposed surgery:  Laparoscopic laser ablation of uterine fibroids.    Pertinent Gynecological History: Menses: Period Cycle (Days): 28 Period Duration (Days): 5 Period Pattern: (!) Irregular Menstrual Flow: Heavy Menstrual Control: Maxi pad Menstrual Control Change Freq (Hours): 1-2 Dysmenorrhea: (!) Severe Dysmenorrhea Symptoms: Cramping, Throbbing, Headache, Nausea    Contraception: none Last mammogram: Not age appropriate Last pap: normal Date: 09/15/2022   Past Medical History:  Diagnosis Date   Asthma    Ectopic pregnancy    x 3   Pelvic inflammatory disease (PID)     Past Surgical History:  Procedure Laterality Date   DIAGNOSTIC LAPAROSCOPY WITH REMOVAL OF ECTOPIC PREGNANCY Right 12/19/2020   Procedure: DIAGNOSTIC LAPAROSCOPY WITH REMOVAL OF ECTOPIC PREGNANCY;  Surgeon: Michaline Kindig, MD;  Location: [facility] ORS;  Service: Gynecology;  Laterality: Right;   ECTOPIC PREGNANCY SURGERY     h/o 1 ectopic on each side    OB History  Gravida Para Term Preterm AB Living  6 0     5    SAB IAB Ectopic Multiple Live Births  3   2        # Outcome Date GA Lbr Len/2nd Weight Sex Delivery Anes PTL Lv  6 Gravida           5 SAB           4 SAB           3 SAB           2 Ectopic           1 Ectopic             Family History  Problem Relation Age of Onset    Hypertension Mother    Diabetes Mother    Asthma Mother    Hypertension Father    Diabetes Father    Gout Father    Asthma Sister    Hypertension Maternal Grandmother    Gout Paternal Grandmother    Alzheimer's disease Paternal Grandmother    Heart failure Paternal Grandmother     Social History   Socioeconomic History   Marital status: Single    Spouse name: Not on file   Number of children: 0   Years of education: 12   Highest education level: Not on file  Occupational History    Comment: unemployed  Tobacco Use   Smoking status: Every Day    Packs/day: 0.50    Years: 10.00    Additional pack years: 0.00    Total pack years: 5.00    Types: Cigarettes, Cigars   Smokeless tobacco: Never  Substance and Sexual Activity   Alcohol use: Yes    Alcohol/week: 3.0 standard drinks of alcohol    Types: 3 Cans of beer per week    Comment: last use 11/24/21 "occassionally"   Drug use: Yes    Types: Marijuana      Comment: last use 11/29/21   Sexual activity: Yes    Partners: Male  Other Topics Concern   Not on file  Social History Narrative   Not on file   Social Determinants of Health   Financial Resource Strain: Not on file  Food Insecurity: Not on file  Transportation Needs: Not on file  Physical Activity: Not on file  Stress: Not on file  Social Connections: Not on file  Intimate Partner Violence: Not At Risk (01/22/2020)   Humiliation, Afraid, Rape, and Kick questionnaire    Fear of Current or Ex-Partner: No    Emotionally Abused: No    Physically Abused: No    Sexually Abused: No    Current Outpatient Medications on File Prior to Visit  Medication Sig Dispense Refill   albuterol (VENTOLIN HFA) 108 (90 Base) MCG/ACT inhaler Inhale into the lungs every 6 (six) hours as needed for wheezing or shortness of breath.     albuterol (VENTOLIN HFA) 108 (90 Base) MCG/ACT inhaler Inhale 2 puffs into the lungs every 6 (six) hours as needed for wheezing or shortness of breath.  8 g 2   benzonatate (TESSALON PERLES) 100 MG capsule Take 1 capsule (100 mg total) by mouth 3 (three) times daily as needed for cough. 30 capsule 0   ibuprofen (ADVIL) 800 MG tablet Take 1 tablet (800 mg total) by mouth every 8 (eight) hours as needed for mild pain or moderate pain. 30 tablet 1   ipratropium-albuterol (DUONEB) 0.5-2.5 (3) MG/3ML SOLN Take 3 mLs by nebulization every 4 (four) hours as needed. 360 mL 0   Multiple Vitamins-Minerals (ADULT ONE DAILY GUMMIES PO) Take 1 tablet by mouth in the morning.     oxyCODONE-acetaminophen (PERCOCET) 5-325 MG tablet Take 1-2 tablets by mouth every 6 (six) hours as needed for severe pain or moderate pain. 30 tablet 0   Relugolix-Estradiol-Norethind (MYFEMBREE) 40-1-0.5 MG TABS Take 1 tablet by mouth daily. 28 tablet 11   Spacer/Aero Chamber Mouthpiece MISC 1 Units by Does not apply route every 4 (four) hours as needed (wheezing). 1 each 0   No current facility-administered medications on file prior to visit.   Allergies  Allergen Reactions   Amoxicillin Anaphylaxis and Rash   Ciprofloxacin Shortness Of Breath and Itching   Penicillins Anaphylaxis and Rash    Has patient had a PCN reaction causing immediate rash, facial/tongue/throat swelling, SOB or lightheadedness with hypotension: yes: Has patient had a PCN reaction causing severe rash involving mucus membranes or skin necrosis: no Has patient had a PCN reaction that required hospitalization no Has patient had a PCN reaction occurring within the last 10 years: no1} If all of the above answers are "NO", then may proceed with Cephalosporin use.    Codeine Itching     Review of Systems Constitutional: No recent fever/chills/sweats Respiratory: No recent cough/bronchitis Cardiovascular: No chest pain Gastrointestinal: No recent nausea/vomiting/diarrhea Genitourinary: No UTI symptoms Hematologic/lymphatic:No history of coagulopathy or recent blood thinner use    Objective:   Blood  pressure 123/66, pulse 74, height 4' 11" (1.499 m), weight 167 lb 14.4 oz (76.2 kg), last menstrual period 09/30/2022, unknown if currently breastfeeding. CONSTITUTIONAL: Well-developed, well-nourished female in no acute distress.  HENT:  Normocephalic, atraumatic, External right and left ear normal. Oropharynx is clear and moist EYES: Conjunctivae and EOM are normal. Pupils are equal, round, and reactive to light. No scleral icterus.  NECK: Normal range of motion, supple, no masses SKIN: Skin is warm and dry. No rash noted.   Not diaphoretic. No erythema. No pallor. NEUROLOGIC: Alert and oriented to person, place, and time. Normal reflexes, muscle tone coordination. No cranial nerve deficit noted. PSYCHIATRIC: Normal mood and affect. Normal behavior. Normal judgment and thought content. CARDIOVASCULAR: Normal heart rate noted, regular rhythm RESPIRATORY: Effort and breath sounds normal, no problems with respiration noted ABDOMEN: Soft, nontender, nondistended. PELVIC: Deferred MUSCULOSKELETAL: Normal range of motion. No edema and no tenderness. 2+ distal pulses.    Labs: Results for orders placed or performed during the hospital encounter of 11/02/22 (from the past 336 hour(s))  Lipase, blood   Collection Time: 11/02/22  6:23 PM  Result Value Ref Range   Lipase 32 11 - 51 U/L  Comprehensive metabolic panel   Collection Time: 11/02/22  6:23 PM  Result Value Ref Range   Sodium 138 135 - 145 mmol/L   Potassium 3.7 3.5 - 5.1 mmol/L   Chloride 107 98 - 111 mmol/L   CO2 28 22 - 32 mmol/L   Glucose, Bld 95 70 - 99 mg/dL   BUN 10 6 - 20 mg/dL   Creatinine, Ser 0.82 0.44 - 1.00 mg/dL   Calcium 8.1 (L) 8.9 - 10.3 mg/dL   Total Protein 5.9 (L) 6.5 - 8.1 g/dL   Albumin 2.8 (L) 3.5 - 5.0 g/dL   AST 26 15 - 41 U/L   ALT 14 0 - 44 U/L   Alkaline Phosphatase 62 38 - 126 U/L   Total Bilirubin 0.3 0.3 - 1.2 mg/dL   GFR, Estimated >60 >60 mL/min   Anion gap 3 (L) 5 - 15  CBC   Collection Time:  11/02/22  6:23 PM  Result Value Ref Range   WBC 10.3 4.0 - 10.5 K/uL   RBC 4.20 3.87 - 5.11 MIL/uL   Hemoglobin 11.3 (L) 12.0 - 15.0 g/dL   HCT 36.1 36.0 - 46.0 %   MCV 86.0 80.0 - 100.0 fL   MCH 26.9 26.0 - 34.0 pg   MCHC 31.3 30.0 - 36.0 g/dL   RDW 16.2 (H) 11.5 - 15.5 %   Platelets 308 150 - 400 K/uL   nRBC 0.0 0.0 - 0.2 %  Urinalysis, Routine w reflex microscopic -Urine, Clean Catch   Collection Time: 11/02/22 10:38 PM  Result Value Ref Range   Color, Urine STRAW (A) YELLOW   APPearance CLEAR (A) CLEAR   Specific Gravity, Urine 1.006 1.005 - 1.030   pH 6.0 5.0 - 8.0   Glucose, UA NEGATIVE NEGATIVE mg/dL   Hgb urine dipstick SMALL (A) NEGATIVE   Bilirubin Urine NEGATIVE NEGATIVE   Ketones, ur NEGATIVE NEGATIVE mg/dL   Protein, ur NEGATIVE NEGATIVE mg/dL   Nitrite NEGATIVE NEGATIVE   Leukocytes,Ua NEGATIVE NEGATIVE   RBC / HPF 0-5 0 - 5 RBC/hpf   WBC, UA 0-5 0 - 5 WBC/hpf   Bacteria, UA NONE SEEN NONE SEEN   Squamous Epithelial / HPF 0-5 0 - 5 /HPF   Mucus PRESENT   POC Urine Pregnancy, ED   Collection Time: 11/02/22 10:39 PM  Result Value Ref Range   Preg Test, Ur Negative Negative  Results for orders placed or performed during the hospital encounter of 10/31/22 (from the past 336 hour(s))  CBC with Differential   Collection Time: 10/31/22  7:43 PM  Result Value Ref Range   WBC 13.0 (H) 4.0 - 10.5 K/uL   RBC 5.03 3.87 - 5.11 MIL/uL   Hemoglobin 13.4 12.0 - 15.0 g/dL   HCT 43.1 36.0 - 46.0 %     MCV 85.7 80.0 - 100.0 fL   MCH 26.6 26.0 - 34.0 pg   MCHC 31.1 30.0 - 36.0 g/dL   RDW 16.4 (H) 11.5 - 15.5 %   Platelets 294 150 - 400 K/uL   nRBC 0.0 0.0 - 0.2 %   Neutrophils Relative % 73 %   Neutro Abs 9.5 (H) 1.7 - 7.7 K/uL   Lymphocytes Relative 17 %   Lymphs Abs 2.2 0.7 - 4.0 K/uL   Monocytes Relative 5 %   Monocytes Absolute 0.7 0.1 - 1.0 K/uL   Eosinophils Relative 4 %   Eosinophils Absolute 0.5 0.0 - 0.5 K/uL   Basophils Relative 1 %   Basophils Absolute 0.1  0.0 - 0.1 K/uL   Immature Granulocytes 0 %   Abs Immature Granulocytes 0.05 0.00 - 0.07 K/uL  Comprehensive metabolic panel   Collection Time: 10/31/22  7:43 PM  Result Value Ref Range   Sodium 142 135 - 145 mmol/L   Potassium 3.8 3.5 - 5.1 mmol/L   Chloride 110 98 - 111 mmol/L   CO2 26 22 - 32 mmol/L   Glucose, Bld 100 (H) 70 - 99 mg/dL   BUN 14 6 - 20 mg/dL   Creatinine, Ser 0.89 0.44 - 1.00 mg/dL   Calcium 8.2 (L) 8.9 - 10.3 mg/dL   Total Protein 5.8 (L) 6.5 - 8.1 g/dL   Albumin 3.1 (L) 3.5 - 5.0 g/dL   AST 48 (H) 15 - 41 U/L   ALT 17 0 - 44 U/L   Alkaline Phosphatase 70 38 - 126 U/L   Total Bilirubin 0.3 0.3 - 1.2 mg/dL   GFR, Estimated >60 >60 mL/min   Anion gap 6 5 - 15  Urinalysis, Routine w reflex microscopic -Urine, Clean Catch   Collection Time: 10/31/22  7:44 PM  Result Value Ref Range   Color, Urine YELLOW (A) YELLOW   APPearance CLOUDY (A) CLEAR   Specific Gravity, Urine 1.029 1.005 - 1.030   pH 5.0 5.0 - 8.0   Glucose, UA NEGATIVE NEGATIVE mg/dL   Hgb urine dipstick NEGATIVE NEGATIVE   Bilirubin Urine NEGATIVE NEGATIVE   Ketones, ur NEGATIVE NEGATIVE mg/dL   Protein, ur NEGATIVE NEGATIVE mg/dL   Nitrite NEGATIVE NEGATIVE   Leukocytes,Ua TRACE (A) NEGATIVE   RBC / HPF 6-10 0 - 5 RBC/hpf   WBC, UA 6-10 0 - 5 WBC/hpf   Bacteria, UA RARE (A) NONE SEEN   Squamous Epithelial / HPF 11-20 0 - 5 /HPF   Mucus PRESENT   POC Urine Pregnancy, ED   Collection Time: 10/31/22  8:22 PM  Result Value Ref Range   Preg Test, Ur Negative Negative  Chlamydia/NGC rt PCR ([facility] only)   Collection Time: 11/01/22 12:25 AM   Specimen: Cervical/Vaginal swab  Result Value Ref Range   Specimen source GC/Chlam ENDOCERVICAL    Chlamydia Tr NOT DETECTED NOT DETECTED   N gonorrhoeae NOT DETECTED NOT DETECTED  Wet prep, genital   Collection Time: 11/01/22 12:25 AM   Specimen: Cervical/Vaginal swab  Result Value Ref Range   Yeast Wet Prep HPF POC NONE SEEN NONE SEEN   Trich, Wet  Prep NONE SEEN NONE SEEN   Clue Cells Wet Prep HPF POC PRESENT (A) NONE SEEN   WBC, Wet Prep HPF POC <10 <10   Sperm NONE SEEN      Imaging Studies: US PELVIC COMPLETE W TRANSVAGINAL AND TORSION R/O  Result Date: 11/03/2022 CLINICAL DATA:  Acute pelvic pain. EXAM: TRANSABDOMINAL   AND TRANSVAGINAL ULTRASOUND OF PELVIS DOPPLER ULTRASOUND OF OVARIES TECHNIQUE: Both transabdominal and transvaginal ultrasound examinations of the pelvis were performed. Transabdominal technique was performed for global imaging of the pelvis including uterus, ovaries, adnexal regions, and pelvic cul-de-sac. It was necessary to proceed with endovaginal exam following the transabdominal exam to visualize the uterus, endometrium, bilateral ovaries and bilateral adnexa. Color and duplex Doppler ultrasound was utilized to evaluate blood flow to the ovaries. COMPARISON:  None Available. FINDINGS: Uterus Measurements: 13.7 cm x 7.3 cm x 8.8 cm = volume: 459.17 mL. Multiple heterogeneous uterine fibroids are seen. The largest measures 8.3 cm x 6.6 cm x 6.3 cm and is seen along the posterior aspect of the uterus. 3.1 cm x 3.6 cm x 3.0 cm and 3.2 cm x 3.1 cm x 2.7 cm uterine fibroids are also seen. Endometrium Thickness: 7.3 mm.  No focal abnormality visualized. Right ovary Measurements: 2.3 cm x 1.5 cm x 2.9 cm = volume: 5.3 mL. Normal appearance/no adnexal mass. Left ovary Measurements: 3.2 cm x 1.7 cm x 2.8 cm = volume: 7.6 mL. Normal appearance/no adnexal mass. Pulsed Doppler evaluation of both ovaries demonstrates normal low-resistance arterial and venous waveforms. Other findings A small amount of pelvic free fluid is seen. IMPRESSION: Large heterogeneous uterine fibroids. Electronically Signed   By: Thaddeus  Houston M.D.   On: 11/03/2022 00:39     Assessment:    1. Fibroids   2. Menorrhagia with irregular cycle   3. Pelvic pain      Plan:   Counseling: Procedure, risks, reasons, benefits and complications (including  injury to bowel, bladder, major blood vessel, ureter, bleeding, possibility of transfusion, infection, or fistula formation) reviewed in detail. Likelihood of success in alleviating the patient's condition was discussed. Routine postoperative instructions will be reviewed with the patient and her family in detail after surgery.  The patient concurred with the proposed plan, giving informed written consent for the surgery.  Surgery scheduled for 11/27/2022.  Preop testing ordered. Instructions reviewed, including NPO after midnight.    Peng Thorstenson, MD [facility]  

## 2022-11-20 ENCOUNTER — Encounter
Admission: RE | Admit: 2022-11-20 | Discharge: 2022-11-20 | Disposition: A | Discharge status: Home / Self Care | Payer: Medicaid Other | Source: Ambulatory Visit | Attending: Obstetrics and Gynecology | Admitting: Obstetrics and Gynecology

## 2022-11-20 VITALS — Ht 59.0 in | Wt 166.0 lb

## 2022-11-20 DIAGNOSIS — Z01812 Encounter for preprocedural laboratory examination: Secondary | ICD-10-CM

## 2022-11-20 HISTORY — DX: Anemia, unspecified: D64.9

## 2022-11-20 NOTE — Patient Instructions (Addendum)
Your procedure is scheduled on: Monday, May 20 Report to the Registration Desk on the 1st floor of the [facility] 7To find out your arrival time, please call [phone] between 1PM - 3PM on: Friday, May 17 If your arrival time is 6:00 am, do not arrive before that time as the [facility] entrance doors do not open until 6:00 am.  REMEMBER: Instructions that are not followed completely may result in serious medical risk, up to and including death; or upon the discretion of your surgeon and anesthesiologist your surgery may need to be rescheduled.  Do not eat or drink after midnight the night before surgery.  No gum chewing or hard candies.  One week prior to surgery: starting May 13 Stop Anti-inflammatories (NSAIDS) such as Advil, Aleve, Ibuprofen, Motrin, Naproxen, Naprosyn and Aspirin based products such as Excedrin, Goody's Powder, BC Powder. Stop ANY OVER THE COUNTER supplements until after surgery. You may however, continue to take Tylenol if needed for pain up until the day of surgery.  Continue taking all prescribed medications  TAKE ONLY THESE MEDICATIONS THE MORNING OF SURGERY  Use albuterol inhaler on the day of surgery and bring to the hospital.  No Alcohol for 24 hours before or after surgery.  No Smoking including e-cigarettes for 24 hours before surgery.  No chewable tobacco products for at least 6 hours before surgery.  No nicotine patches on the day of surgery.  Do not use any "recreational" drugs for at least a week (preferably 2 weeks) before your surgery.  Please be advised that the combination of cocaine and anesthesia may have negative outcomes, up to and including death. If you test positive for cocaine, your surgery will be cancelled.  On the morning of surgery brush your teeth with toothpaste and water, you may rinse your mouth with mouthwash if you wish. Do not swallow any toothpaste or mouthwash.  Use CHG Soap as directed on instruction sheet.  Do  not wear jewelry, make-up, hairpins, clips or nail polish.  Do not wear lotions, powders, or perfumes.   Do not shave body hair from the neck down 48 hours before surgery.  Contact lenses, hearing aids and dentures may not be worn into surgery.  Do not bring valuables to the hospital. [facility] is not responsible for any missing/lost belongings or valuables.   Notify your doctor if there is any change in your medical condition (cold, fever, infection).  Wear comfortable clothing (specific to your surgery type) to the hospital.  After surgery, you can help prevent lung complications by doing breathing exercises.  Take deep breaths and cough every 1-2 hours. Your doctor may order a device called an Incentive Spirometer to help you take deep breaths. When coughing or sneezing, hold a pillow firmly against your incision with both hands. This is called "splinting." Doing this helps protect your incision. It also decreases belly discomfort.  If you are being discharged the day of surgery, you will not be allowed to drive home. You will need a responsible individual to drive you home and stay with you for 24 hours after surgery.   If you are taking public transportation, you will need to have a responsible individual with you.  Please call the Pre-admissions Testing Dept. at [phone] if you have any questions about these instructions.  Surgery Visitation Policy:  Patients having surgery or a procedure may have two visitors.  Children under the age of 61 must have an adult with them who is not  the patient.     Preparing for Surgery with CHLORHEXIDINE GLUCONATE (CHG) Soap  Chlorhexidine Gluconate (CHG) Soap  o An antiseptic cleaner that kills germs and bonds with the skin to continue killing germs even after washing  o Used for showering the night before surgery and morning of surgery  Before surgery, you can play an important role by reducing the number of germs on your skin.   CHG (Chlorhexidine gluconate) soap is an antiseptic cleanser which kills germs and bonds with the skin to continue killing germs even after washing.  Please do not use if you have an allergy to CHG or antibacterial soaps. If your skin becomes reddened/irritated stop using the CHG.  1. Shower the NIGHT BEFORE SURGERY and the MORNING OF SURGERY with CHG soap.  2. If you choose to wash your hair, wash your hair first as usual with your normal shampoo.  3. After shampooing, rinse your hair and body thoroughly to remove the shampoo.  4. Use CHG as you would any other liquid soap. You can apply CHG directly to the skin and wash gently with a scrungie or a clean washcloth.  5. Apply the CHG soap to your body only from the neck down. Do not use on open wounds or open sores. Avoid contact with your eyes, ears, mouth, and genitals (private parts). Wash face and genitals (private parts) with your normal soap.  6. Wash thoroughly, paying special attention to the area where your surgery will be performed.  7. Thoroughly rinse your body with warm water.  8. Do not shower/wash with your normal soap after using and rinsing off the CHG soap.  9. Pat yourself dry with a clean towel.  10. Wear clean pajamas to bed the night before surgery.  12. Place clean sheets on your bed the night of your first shower and do not sleep with pets.  13. Shower again with the CHG soap on the day of surgery prior to arriving at the hospital.  14. Do not apply any deodorants/lotions/powders.  15. Please wear clean clothes to the hospital.

## 2022-11-24 ENCOUNTER — Other Ambulatory Visit
Admission: RE | Admit: 2022-11-24 | Payer: Medicaid Other | Source: Home / Self Care | Admitting: Obstetrics and Gynecology

## 2022-11-24 ENCOUNTER — Encounter
Admission: RE | Admit: 2022-11-24 | Discharge: 2022-11-24 | Disposition: A | Discharge status: Home / Self Care | Payer: Medicaid Other | Source: Ambulatory Visit | Attending: Obstetrics and Gynecology | Admitting: Obstetrics and Gynecology

## 2022-11-24 DIAGNOSIS — Z01812 Encounter for preprocedural laboratory examination: Secondary | ICD-10-CM | POA: Insufficient documentation

## 2022-11-24 DIAGNOSIS — Z01818 Encounter for other preprocedural examination: Secondary | ICD-10-CM

## 2022-11-24 LAB — COMPREHENSIVE METABOLIC PANEL
ALT: 9 U/L (ref 0–44)
AST: 19 U/L (ref 15–41)
Albumin: 3.1 g/dL — ABNORMAL LOW (ref 3.5–5.0)
Alkaline Phosphatase: 82 U/L (ref 38–126)
Anion gap: 7 (ref 5–15)
BUN: 10 mg/dL (ref 6–20)
CO2: 24 mmol/L (ref 22–32)
Calcium: 8.2 mg/dL — ABNORMAL LOW (ref 8.9–10.3)
Chloride: 109 mmol/L (ref 98–111)
Creatinine, Ser: 0.83 mg/dL (ref 0.44–1.00)
GFR, Estimated: >60 mL/min (ref >60)
Glucose, Bld: 80 mg/dL (ref 70–99)
Potassium: 3.8 mmol/L (ref 3.5–5.1)
Sodium: 140 mmol/L (ref 135–145)
Total Bilirubin: 0.4 mg/dL (ref 0.3–1.2)
Total Protein: 6.5 g/dL (ref 6.5–8.1)

## 2022-11-24 LAB — CBC
HCT: 36.2 % (ref 36.0–46.0)
Hemoglobin: 11.2 g/dL — ABNORMAL LOW (ref 12.0–15.0)
MCH: 26.6 pg (ref 26.0–34.0)
MCHC: 30.9 g/dL (ref 30.0–36.0)
MCV: 86 fL (ref 80.0–100.0)
Platelets: 279 10*3/uL (ref 150–400)
RBC: 4.21 MIL/uL (ref 3.87–5.11)
RDW: 16.5 % — ABNORMAL HIGH (ref 11.5–15.5)
WBC: 7.8 10*3/uL (ref 4.0–10.5)
nRBC: 0 % (ref 0.0–0.2)

## 2022-11-27 ENCOUNTER — Ambulatory Visit
Admission: RE | Admit: 2022-11-27 | Payer: Medicaid Other | Source: Home / Self Care | Admitting: Obstetrics and Gynecology

## 2022-11-27 ENCOUNTER — Encounter
Admission: RE | Disposition: A | Discharge status: Home / Self Care | Payer: Self-pay | Source: Home / Self Care | Attending: Obstetrics and Gynecology

## 2022-11-27 ENCOUNTER — Ambulatory Visit
Admission: RE | Admit: 2022-11-27 | Discharge: 2022-11-27 | Disposition: A | Discharge status: Home / Self Care | Payer: Medicaid Other | Attending: Obstetrics and Gynecology | Admitting: Obstetrics and Gynecology

## 2022-11-27 ENCOUNTER — Ambulatory Visit: Payer: Medicaid Other | Admitting: Anesthesiology

## 2022-11-27 ENCOUNTER — Encounter: Payer: Self-pay | Admitting: Obstetrics and Gynecology

## 2022-11-27 ENCOUNTER — Ambulatory Visit: Payer: Medicaid Other | Admitting: Urgent Care

## 2022-11-27 ENCOUNTER — Other Ambulatory Visit: Payer: Self-pay

## 2022-11-27 ENCOUNTER — Encounter: Admission: RE | Payer: Self-pay | Source: Home / Self Care

## 2022-11-27 DIAGNOSIS — Z79899 Other long term (current) drug therapy: Secondary | ICD-10-CM | POA: Diagnosis not present

## 2022-11-27 DIAGNOSIS — Z01812 Encounter for preprocedural laboratory examination: Secondary | ICD-10-CM

## 2022-11-27 DIAGNOSIS — R102 Pelvic and perineal pain: Secondary | ICD-10-CM | POA: Diagnosis not present

## 2022-11-27 DIAGNOSIS — Z01818 Encounter for other preprocedural examination: Secondary | ICD-10-CM

## 2022-11-27 DIAGNOSIS — F17210 Nicotine dependence, cigarettes, uncomplicated: Secondary | ICD-10-CM | POA: Diagnosis not present

## 2022-11-27 DIAGNOSIS — D259 Leiomyoma of uterus, unspecified: Secondary | ICD-10-CM

## 2022-11-27 DIAGNOSIS — N920 Excessive and frequent menstruation with regular cycle: Secondary | ICD-10-CM | POA: Diagnosis not present

## 2022-11-27 DIAGNOSIS — N840 Polyp of corpus uteri: Secondary | ICD-10-CM | POA: Diagnosis present

## 2022-11-27 DIAGNOSIS — D250 Submucous leiomyoma of uterus: Secondary | ICD-10-CM

## 2022-11-27 HISTORY — PX: DILATATION & CURETTAGE/HYSTEROSCOPY WITH MYOSURE: SHX6511

## 2022-11-27 LAB — TYPE AND SCREEN
ABO/RH(D): O POS
Antibody Screen: NEGATIVE

## 2022-11-27 LAB — POCT PREGNANCY, URINE: Preg Test, Ur: NEGATIVE

## 2022-11-27 SURGERY — RADIOFREQUENCY ABLATION, LEIOMYOMA, UTERUS, TRANSCERVICAL APPROACH, WITH US GUIDANCE
Anesthesia: General | Site: Vagina

## 2022-11-27 SURGERY — DILATATION & CURETTAGE/HYSTEROSCOPY WITH NOVASURE ABLATION
Anesthesia: Choice

## 2022-11-27 MED ORDER — FAMOTIDINE 20 MG PO TABS
ORAL_TABLET | ORAL | Status: AC
  Filled 2022-11-27: qty 1

## 2022-11-27 MED ORDER — ACETAMINOPHEN 500 MG PO TABS
ORAL_TABLET | ORAL | Status: AC
  Filled 2022-11-27: qty 2

## 2022-11-27 MED ORDER — FENTANYL CITRATE (PF) 100 MCG/2ML IJ SOLN
INTRAMUSCULAR | Status: AC
  Filled 2022-11-27: qty 2

## 2022-11-27 MED ORDER — PHENYLEPHRINE HCL (PRESSORS) 10 MG/ML IV SOLN
INTRAVENOUS | Status: DC | PRN
  Administered 2022-11-27: 160 ug via INTRAVENOUS

## 2022-11-27 MED ORDER — GABAPENTIN 300 MG PO CAPS
300.0000 mg | ORAL_CAPSULE | ORAL | Status: AC
  Administered 2022-11-27: 300 mg via ORAL

## 2022-11-27 MED ORDER — CHLORHEXIDINE GLUCONATE 0.12 % MT SOLN
OROMUCOSAL | Status: AC
  Filled 2022-11-27: qty 15

## 2022-11-27 MED ORDER — LACTATED RINGERS IV SOLN: INTRAVENOUS | Status: DC

## 2022-11-27 MED ORDER — CLINDAMYCIN PHOSPHATE 900 MG/50ML IV SOLN
INTRAVENOUS | Status: AC
  Filled 2022-11-27: qty 50

## 2022-11-27 MED ORDER — CELECOXIB 200 MG PO CAPS
ORAL_CAPSULE | ORAL | Status: AC
  Filled 2022-11-27: qty 2

## 2022-11-27 MED ORDER — TRAMADOL HCL 50 MG PO TABS: 50.0000 mg | ORAL_TABLET | Freq: Four times a day (QID) | ORAL | 0 refills | Status: DC | PRN

## 2022-11-27 MED ORDER — MIDAZOLAM HCL 2 MG/2ML IJ SOLN
INTRAMUSCULAR | Status: DC | PRN
  Administered 2022-11-27: 2 mg via INTRAVENOUS

## 2022-11-27 MED ORDER — PROPOFOL 10 MG/ML IV BOLUS
INTRAVENOUS | Status: AC
  Filled 2022-11-27: qty 20

## 2022-11-27 MED ORDER — OXYCODONE HCL 5 MG PO TABS
ORAL_TABLET | ORAL | Status: AC
  Filled 2022-11-27: qty 1

## 2022-11-27 MED ORDER — CELECOXIB 200 MG PO CAPS
400.0000 mg | ORAL_CAPSULE | ORAL | Status: AC
  Administered 2022-11-27: 400 mg via ORAL

## 2022-11-27 MED ORDER — CHLORHEXIDINE GLUCONATE 0.12 % MT SOLN
15.0000 mL | Freq: Once | OROMUCOSAL | Status: AC
  Administered 2022-11-27: 15 mL via OROMUCOSAL

## 2022-11-27 MED ORDER — TRANEXAMIC ACID 1000 MG/10ML IV SOLN
INTRAVENOUS | Status: AC
  Filled 2022-11-27: qty 10

## 2022-11-27 MED ORDER — DIPHENHYDRAMINE HCL 25 MG PO CAPS
ORAL_CAPSULE | ORAL | Status: AC
  Filled 2022-11-27: qty 1

## 2022-11-27 MED ORDER — POVIDONE-IODINE 10 % EX SWAB: 2.0000 | Freq: Once | CUTANEOUS | Status: DC

## 2022-11-27 MED ORDER — OXYCODONE HCL 5 MG PO TABS
5.0000 mg | ORAL_TABLET | Freq: Once | ORAL | Status: AC | PRN
  Administered 2022-11-27: 5 mg via ORAL

## 2022-11-27 MED ORDER — FAMOTIDINE 20 MG PO TABS
20.0000 mg | ORAL_TABLET | Freq: Once | ORAL | Status: AC
  Administered 2022-11-27: 20 mg via ORAL

## 2022-11-27 MED ORDER — GENTAMICIN SULFATE 40 MG/ML IJ SOLN
5.0000 mg/kg | INTRAVENOUS | Status: AC
  Administered 2022-11-27: 380 mg via INTRAVENOUS
  Filled 2022-11-27: qty 9.5

## 2022-11-27 MED ORDER — ONDANSETRON HCL 4 MG/2ML IJ SOLN
INTRAMUSCULAR | Status: AC
  Filled 2022-11-27: qty 2

## 2022-11-27 MED ORDER — TRANEXAMIC ACID-NACL 1000-0.7 MG/100ML-% IV SOLN
INTRAVENOUS | Status: AC
  Filled 2022-11-27: qty 100

## 2022-11-27 MED ORDER — ACETAMINOPHEN 500 MG PO TABS
1000.0000 mg | ORAL_TABLET | ORAL | Status: AC
  Administered 2022-11-27: 1000 mg via ORAL

## 2022-11-27 MED ORDER — CLINDAMYCIN PHOSPHATE 900 MG/50ML IV SOLN
900.0000 mg | INTRAVENOUS | Status: AC
  Administered 2022-11-27: 900 mg via INTRAVENOUS

## 2022-11-27 MED ORDER — FENTANYL CITRATE (PF) 100 MCG/2ML IJ SOLN
25.0000 ug | INTRAMUSCULAR | Status: DC | PRN
  Administered 2022-11-27 (×2): 25 ug via INTRAVENOUS

## 2022-11-27 MED ORDER — LIDOCAINE HCL (CARDIAC) PF 100 MG/5ML IV SOSY
PREFILLED_SYRINGE | INTRAVENOUS | Status: DC | PRN
  Administered 2022-11-27: 100 mg via INTRAVENOUS

## 2022-11-27 MED ORDER — DEXAMETHASONE SODIUM PHOSPHATE 10 MG/ML IJ SOLN
INTRAMUSCULAR | Status: DC | PRN
  Administered 2022-11-27: 10 mg via INTRAVENOUS

## 2022-11-27 MED ORDER — ORAL CARE MOUTH RINSE: 15.0000 mL | Freq: Once | OROMUCOSAL | Status: AC

## 2022-11-27 MED ORDER — MIDAZOLAM HCL 2 MG/2ML IJ SOLN
INTRAMUSCULAR | Status: AC
  Filled 2022-11-27: qty 2

## 2022-11-27 MED ORDER — DIPHENHYDRAMINE HCL 25 MG PO CAPS
25.0000 mg | ORAL_CAPSULE | Freq: Once | ORAL | Status: AC
  Administered 2022-11-27: 25 mg via ORAL

## 2022-11-27 MED ORDER — ONDANSETRON HCL 4 MG/2ML IJ SOLN
INTRAMUSCULAR | Status: DC | PRN
  Administered 2022-11-27: 4 mg via INTRAVENOUS

## 2022-11-27 MED ORDER — IBUPROFEN 800 MG PO TABS: 800.0000 mg | ORAL_TABLET | Freq: Three times a day (TID) | ORAL | 0 refills | Status: DC | PRN

## 2022-11-27 MED ORDER — OXYCODONE HCL 5 MG/5ML PO SOLN: 5.0000 mg | Freq: Once | ORAL | Status: AC | PRN

## 2022-11-27 MED ORDER — PROPOFOL 10 MG/ML IV BOLUS
INTRAVENOUS | Status: DC | PRN
  Administered 2022-11-27: 150 mg via INTRAVENOUS

## 2022-11-27 MED ORDER — TRANEXAMIC ACID-NACL 1000-0.7 MG/100ML-% IV SOLN
1000.0000 mg | Freq: Once | INTRAVENOUS | Status: AC
  Administered 2022-11-27: 1000 mg via INTRAVENOUS

## 2022-11-27 MED ORDER — SODIUM CHLORIDE 0.9 % IR SOLN
Status: DC | PRN
  Administered 2022-11-27: 1

## 2022-11-27 MED ORDER — GABAPENTIN 300 MG PO CAPS
ORAL_CAPSULE | ORAL | Status: AC
  Filled 2022-11-27: qty 1

## 2022-11-27 MED ORDER — FENTANYL CITRATE (PF) 100 MCG/2ML IJ SOLN
INTRAMUSCULAR | Status: DC | PRN
  Administered 2022-11-27 (×3): 50 ug via INTRAVENOUS

## 2022-11-27 MED ORDER — DEXAMETHASONE SODIUM PHOSPHATE 10 MG/ML IJ SOLN: INTRAMUSCULAR | Status: DC | PRN

## 2022-11-27 SURGICAL SUPPLY — 21 items
DEVICE MYOSURE LITE (MISCELLANEOUS) IMPLANT
DRSG TELFA 3X8 NADH STRL (GAUZE/BANDAGES/DRESSINGS) IMPLANT
ELECT REM PT RETURN 9FT ADLT (ELECTROSURGICAL) ×2
ELECTRODE REM PT RTRN 9FT ADLT (ELECTROSURGICAL) ×4 IMPLANT
GLOVE BIO SURGEON STRL SZ 6.5 (GLOVE) ×4 IMPLANT
GOWN STRL REUS W/ TWL LRG LVL3 (GOWN DISPOSABLE) ×8 IMPLANT
GOWN STRL REUS W/TWL LRG LVL3 (GOWN DISPOSABLE) ×4
HANDPIECE RFA SONATA (MISCELLANEOUS) IMPLANT
IV NS IRRIG 3000ML ARTHROMATIC (IV SOLUTION) ×4 IMPLANT
KIT TURNOVER CYSTO (KITS) ×4 IMPLANT
MANIFOLD NEPTUNE II (INSTRUMENTS) ×4 IMPLANT
NDL HYPO 22X1.5 SAFETY MO (MISCELLANEOUS) IMPLANT
NEEDLE HYPO 22X1.5 SAFETY MO (MISCELLANEOUS) IMPLANT
PACK DNC HYST (MISCELLANEOUS) ×4 IMPLANT
PAD PREP OB/GYN DISP 24X41 (PERSONAL CARE ITEMS) ×4 IMPLANT
SCRUB CHG 4% DYNA-HEX 4OZ (MISCELLANEOUS) ×4 IMPLANT
SEAL ROD LENS SCOPE MYOSURE (ABLATOR) ×4 IMPLANT
SET CYSTO W/LG BORE CLAMP LF (SET/KITS/TRAYS/PACK) IMPLANT
SYR 10ML LL (SYRINGE) IMPLANT
SYR 30ML LL (SYRINGE) IMPLANT
WATER STERILE IRR 500ML POUR (IV SOLUTION) ×4 IMPLANT

## 2022-11-27 NOTE — Discharge Instructions (Signed)
AMBULATORY SURGERY  DISCHARGE INSTRUCTIONS   The drugs that you were given will stay in your system until tomorrow so for the next 24 hours you should not:  Drive an automobile Make any legal decisions Drink any alcoholic beverage   You may resume regular meals tomorrow.  Today it is better to start with liquids and gradually work up to solid foods.  You may eat anything you prefer, but it is better to start with liquids, then soup and crackers, and gradually work up to solid foods.   Please notify your doctor immediately if you have any unusual bleeding, trouble breathing, redness and pain at the surgery site, drainage, fever, or pain not relieved by medication.       Please contact your physician with any problems or Same Day Surgery at [phone], Monday through Friday 6 am to 4 pm, or [facility] Main number at [phone].  

## 2022-11-27 NOTE — Transfer of Care (Signed)
Immediate Anesthesia Transfer of Care Note  Patient: Nicole Gross  Procedure(s) Performed: Radio Frequency Ablation with Sonata (Vagina ) DILATATION & CURETTAGE/HYSTEROSCOPY WITH MYOSURE (Vagina )  Patient Location: PACU  Anesthesia Type:General  Level of Consciousness: drowsy and patient cooperative  Airway & Oxygen Therapy: Patient Spontanous Breathing and Patient connected to face mask oxygen  Post-op Assessment: Report given to RN and Post -op Vital signs reviewed and stable  Post vital signs: Reviewed and stable  Last Vitals:  Vitals Value Taken Time  BP 124/90 11/27/22 1534  Temp 36.8 C 11/27/22 1534  Pulse 72 11/27/22 1537  Resp 17 11/27/22 1537  SpO2 100 % 11/27/22 1537  Vitals shown include unvalidated device data.  Last Pain:  Vitals:   11/27/22 1534  PainSc: Asleep         Complications: No notable events documented.

## 2022-11-27 NOTE — Op Note (Signed)
Procedure(s): Radio Frequency Ablation with Sonata DILATATION & CURETTAGE/HYSTEROSCOPY WITH MYOSURE Procedure Note  Hikma Foley female 37 y.o. 11/27/2022  Indications: The patient is a 37 y.o. G36P0050 female with pelvic pain, menorrhagia with regular cycle, and uterine fibroids.   Pre-operative Diagnosis: Pelvic pain, menorrhagia with regular cycle, uterine fibroids  Post-operative Diagnosis: Same with endometrial polyp  Surgeon: Hildred Laser, MD  Assistants:  Surgical scru assist.   Anesthesia: General endotracheal anesthesia  Findings: The uterus was sounded to 13 cm.  Mildly proliferative endometrium. Endometrial polyp at fundus. Tubal ostia were visualized  bilaterally.  Multiple uterine fibroids present (~ 4 identified, 2 within uterine cavity).   Procedure Details: The patient was seen in the Holding Room. The risks, benefits, complications, treatment options, and expected outcomes were discussed with the patient.  The patient concurred with the proposed plan, giving informed consent.  The site of surgery properly noted/marked. The patient was taken to the Operating Room, identified as Nicole Gross and the procedure verified as Procedure(s) (LRB): Radio Frequency Ablation with Sonata (N/A) DILATATION & CURETTAGE/HYSTEROSCOPY WITH MYOSURE.   The patient was placed under general anesthesia without difficulty.  She was then prepped and draped in the normal sterile fashion, and placed in the dorsal lithotomy position.  A time out was performed.  An exam under anesthesia was performed with the findings noted above.  Straight catheterization was performed. A sterile speculum was inserted into vagina. A single-tooth tenaculum was used to grasp the anterior lip of the cervix. Cervical dilation was performed. A 5 mm hysteroscope was introduced into the uterus under direct visualization. The cavity was allowed to fill, and then the entire cavity was explored with the findings described  above. An endometrial polyp was discovered at the fundal region.  A Myosure device was then inserted into the uterine cavity and a polypectomy was performed.  The Myosure device was then removed from the uterine cavity.  Further cervical dilation was then performed to accommodate a 9 mm Hagar dilator.    The Sonata handpiece was then inserted and a complete uterine survey was performed with the ultrasound.  Global assessment was performed identifying the myomas.  An anterior type V, 2.5 cm fibroid at 6:00 was treated first.  A graphical overlay was placed to target the ablation zone.  Patient reports tube was introduced.  Care was taken to ensure that the guide was within the serosal boundary and the needle electrodes were deployed.  A safety check was again performed.  Radiofrequency ablation was performed initially for 1.5 minutes a second minimal was identified in the posterior wall approximately 6 cm,  type II-V.  With similar technique ensuring the safety exam was within the serosal boundary, ablation was performed initially for 4 minutes and 36 seconds with ablation size of 3.7 x 3.0 cm.  Two additional ablations were performed for approximately 6 minutes, and 5 minutes and 42 seconds each following the above techniques.  The fibroids treated appeared ablated with US guidance without gassing noted by [place] appearance.  The endometrium was spared.  The Sonata device was then removed from the uterine cavity.  After this, a sharp curette was then passed into the uterus and endometrial sampling was collected for pathology.    All instrument and sponge counts were correct at the end of the procedure x 2.  The patient tolerated the procedure well.  She was awakened and taken to the PACU in stable condition.    Estimated Blood Loss:  50 ml  Drains: straight catheterization prior to procedure with  100 ml of clear urine         Total IV Fluids:  909 ml  Specimens: Endometrial polyp, endometrial  curettings         Implants: None         Complications:  None; patient tolerated the procedure well.         Disposition: PACU - hemodynamically stable.         Condition: stable   Hildred Laser, MD [facility]

## 2022-11-27 NOTE — Interval H&P Note (Signed)
History and Physical Interval Note:  11/27/2022 11:23 AM  Nicole Gross  has presented today for surgery, with the diagnosis of Uterine Fibroids, Pelvic Pain, Menorrhagia.  The various methods of treatment have been discussed with the patient and family. After consideration of risks, benefits and other options for treatment, the patient has consented to  Procedure(s): Radio Frequency Ablation with Sonata (N/A) as a surgical intervention.  The patient's history has been reviewed, patient examined, no change in status, stable for surgery.  I have reviewed the patient's chart and labs.  Questions were answered to the patient's satisfaction.     Hildred Laser, MD  OB/GYN

## 2022-11-27 NOTE — Anesthesia Preprocedure Evaluation (Signed)
Anesthesia Evaluation  Patient identified by MRN, date of birth, ID band Patient awake    Reviewed: Allergy & Precautions, NPO status , Patient's Chart, lab work & pertinent test results  Airway Mallampati: II  TM Distance: >3 FB Neck ROM: full    Dental  (+) Dental Advidsory Given, Teeth Intact   Pulmonary neg shortness of breath, asthma , Current Smoker   Pulmonary exam normal        Cardiovascular (-) angina (-) Past MI negative cardio ROS Normal cardiovascular exam     Neuro/Psych negative neurological ROS  negative psych ROS   GI/Hepatic negative GI ROS, Neg liver ROS,,,  Endo/Other  negative endocrine ROS    Renal/GU      Musculoskeletal   Abdominal   Peds  Hematology negative hematology ROS (+)   Anesthesia Other Findings Past Medical History: No date: Anemia No date: Asthma No date: Ectopic pregnancy     Comment:  x 3 11/2022: Fibroid uterus No date: Pelvic inflammatory disease (PID)  Past Surgical History: 12/19/2020: DIAGNOSTIC LAPAROSCOPY WITH REMOVAL OF ECTOPIC PREGNANCY;  Right     Comment:  Procedure: DIAGNOSTIC LAPAROSCOPY WITH REMOVAL OF               ECTOPIC PREGNANCY;  Surgeon: Hildred Laser, MD;                Location: [facility] ORS;  Service: Gynecology;  Laterality:               Right; No date: ECTOPIC PREGNANCY SURGERY     Comment:  h/o 1 ectopic on each side     Reproductive/Obstetrics negative OB ROS                             Anesthesia Physical Anesthesia Plan  ASA: 2  Anesthesia Plan: General ETT   Post-op Pain Management:    Induction: Intravenous  PONV Risk Score and Plan: Ondansetron, Dexamethasone and Midazolam  Airway Management Planned: Oral ETT  Additional Equipment:   Intra-op Plan:   Post-operative Plan: Extubation in OR  Informed Consent: I have reviewed the patients History and Physical, chart, labs and discussed the procedure  including the risks, benefits and alternatives for the proposed anesthesia with the patient or authorized representative who has indicated his/her understanding and acceptance.     Dental Advisory Given  Plan Discussed with: Anesthesiologist, CRNA and Surgeon  Anesthesia Plan Comments: (Patient consented for risks of anesthesia including but not limited to:  - adverse reactions to medications - damage to eyes, teeth, lips or other oral mucosa - nerve damage due to positioning  - sore throat or hoarseness - Damage to heart, brain, nerves, lungs, other parts of body or loss of life  Patient voiced understanding.)       Anesthesia Quick Evaluation

## 2022-11-27 NOTE — Anesthesia Postprocedure Evaluation (Signed)
Anesthesia Post Note  Patient: Nicole Gross  Procedure(s) Performed: Radio Frequency Ablation with Sonata (Vagina ) DILATATION & CURETTAGE/HYSTEROSCOPY WITH MYOSURE (Vagina )  Patient location during evaluation: PACU Anesthesia Type: General Level of consciousness: awake and alert Pain management: pain level controlled Vital Signs Assessment: post-procedure vital signs reviewed and stable Respiratory status: spontaneous breathing, nonlabored ventilation and respiratory function stable Cardiovascular status: blood pressure returned to baseline and stable Postop Assessment: no apparent nausea or vomiting Anesthetic complications: no   No notable events documented.   Last Vitals:  Vitals:   11/27/22 1615 11/27/22 1631  BP: (!) 135/98 135/77  Pulse: 74 64  Resp: 15 18  Temp: 36.9 C 36.7 C  SpO2: 99% 97%    Last Pain:  Vitals:   11/27/22 1631  TempSrc: Temporal  PainSc: 5                  Foye Deer

## 2022-11-28 ENCOUNTER — Telehealth: Payer: Self-pay

## 2022-11-28 ENCOUNTER — Encounter: Payer: Self-pay | Admitting: Obstetrics and Gynecology

## 2022-11-28 MED ORDER — OXYCODONE HCL 5 MG PO TABS: 5.0000 mg | ORAL_TABLET | Freq: Four times a day (QID) | ORAL | 0 refills | Status: DC | PRN

## 2022-11-28 MED ORDER — ACETAMINOPHEN 500 MG PO TABS: 1000.0000 mg | ORAL_TABLET | Freq: Four times a day (QID) | ORAL | 0 refills | Status: AC | PRN

## 2022-11-28 NOTE — Telephone Encounter (Signed)
Pt calling asking for something other than tramadol because it makes her nauseas; states "the other med, not sure of the name - oxy? Or something like that worked better.  [phone]

## 2022-11-28 NOTE — Telephone Encounter (Signed)
Sent patient Mychart message regarding her request and sent new prescription.

## 2022-11-29 NOTE — Telephone Encounter (Signed)
Pt calling again; adv [facility] has sent her a [employer] as well as sent in rx/

## 2022-11-30 ENCOUNTER — Encounter: Payer: Self-pay | Admitting: Obstetrics and Gynecology

## 2022-12-08 NOTE — Progress Notes (Deleted)
    OBSTETRICS/GYNECOLOGY POST-OPERATIVE CLINIC VISIT  Subjective:     Nicole Gross is a 37 y.o. female who presents to the clinic 2 weeks status post Radio Frequency Ablation with Sonata DILATATION & CURETTAGE/HYSTEROSCOPY WITH MYOSURE  for Uterine Fibroids, Pelvic Pain, Menorrhagia . Eating a regular diet {with-without:5700} difficulty. Bowel movements are {normal/abnormal***:19619}. {pain control:13522::"The patient is not having any pain."}  {Common ambulatory SmartLinks:19316}  Review of Systems {ros; complete:30496}   Objective:   There were no vitals taken for this visit. There is no height or weight on file to calculate BMI.  General:  alert and no distress  Abdomen: soft, bowel sounds active, non-tender  Incision:   {incision:13716::"no dehiscence","incision well approximated","healing well","no drainage","no erythema","no hernia","no seroma","no swelling"}    Pathology:    Assessment:   Patient s/p Radio Frequency Ablation with Sonata DILATATION & CURETTAGE/HYSTEROSCOPY WITH MYOSURE (surgery)  {doing well:13525::"Doing well postoperatively."}   Plan:   1. Continue any current medications as instructed by provider. 2. Wound care discussed. 3. Operative findings again reviewed. Pathology report discussed. 4. Activity restrictions: {restrictions:13723} 5. Anticipated return to work: {work return:14002}. 6. Follow up: {1-61:09604} {time; units:18646} for ***   Hildred Laser, MD [facility]

## 2022-12-12 ENCOUNTER — Encounter: Payer: Medicaid Other | Admitting: Obstetrics and Gynecology

## 2022-12-13 ENCOUNTER — Telehealth: Payer: Self-pay | Admitting: Obstetrics and Gynecology

## 2022-12-13 NOTE — Telephone Encounter (Signed)
Reached out to pt to reschedule Post Op appt that was scheduled on 12/12/2022 at 9:35.  Was able to reschedule the pt to June 19 at 8:15.

## 2022-12-21 ENCOUNTER — Ambulatory Visit (INDEPENDENT_AMBULATORY_CARE_PROVIDER_SITE_OTHER): Payer: Medicaid Other | Admitting: Obstetrics and Gynecology

## 2022-12-21 ENCOUNTER — Telehealth: Payer: Self-pay

## 2022-12-21 ENCOUNTER — Encounter: Payer: Self-pay | Admitting: Obstetrics and Gynecology

## 2022-12-21 VITALS — BP 115/65 | HR 88 | Ht 59.0 in | Wt 173.0 lb

## 2022-12-21 DIAGNOSIS — K5900 Constipation, unspecified: Secondary | ICD-10-CM

## 2022-12-21 DIAGNOSIS — D219 Benign neoplasm of connective and other soft tissue, unspecified: Secondary | ICD-10-CM

## 2022-12-21 DIAGNOSIS — N840 Polyp of corpus uteri: Secondary | ICD-10-CM

## 2022-12-21 DIAGNOSIS — N921 Excessive and frequent menstruation with irregular cycle: Secondary | ICD-10-CM

## 2022-12-21 DIAGNOSIS — D259 Leiomyoma of uterus, unspecified: Secondary | ICD-10-CM

## 2022-12-21 DIAGNOSIS — Z4889 Encounter for other specified surgical aftercare: Secondary | ICD-10-CM

## 2022-12-21 NOTE — Progress Notes (Signed)
    OBSTETRICS/GYNECOLOGY POST-OPERATIVE CLINIC VISIT  Subjective:     Nicole Gross is a 37 y.o. female who presents to the clinic 3 weeks status post for Hysteroscopy D&C with polypectomy and laser ablation of uterine fibroids (Sonata system). Eating a regular diet with difficulty. Bowel movements are abnormal with constipation. Not taking anything but reports taking a Linzess that her father gave her yesterday . Pain is not well controlled.  Medications being used: ibuprofen (OTC). ALso using lidocaine patches which don't help much. Pain is more achy. Reports that she has had a cycle, was lighter than her usual cycles.   The following portions of the patient's history were reviewed and updated as appropriate: allergies, current medications, past family history, past medical history, past social history, past surgical history, and problem list.  Review of Systems Pertinent items are noted in HPI.   Objective:   BP 115/65   Pulse 88   Ht 4\' 11"  (1.499 m)   Wt 173 lb (78.5 kg)   BMI 34.94 kg/m  Body mass index is 34.94 kg/m.  General:  alert and no distress  Abdomen: soft, bowel sounds active, mildly tender in lower abdomen, bloating present.   Pelvis:   Deferred    Pathology:  Part A: Endometrial curettings  Proliferative phase endometrium, negative for hyperplasia and/or malignancy.  Fragments of benign endometrial polyp.  Assessment:   Postoperative state Endometrial polyp Uterine fibroids Menorrhagia with regular cycle  Constipation   lan:   1. Continue any current medications as instructed by provider. Advised on Miralax for constipation.  2. Wound care discussed. 3. Operative findings again reviewed. Pathology report discussed. 4. Activity restrictions: none 5. Anticipated return to work:  patient has already returned to work . 6. Follow up: 3  months for follow up ultrasound    Hildred Laser MD [facility]

## 2022-12-21 NOTE — Telephone Encounter (Signed)
Patient reports she is having pain running down her leg to her knees. She missed her post op 6/4. Patient scheduled for opening today at 3:55pm.

## 2022-12-22 ENCOUNTER — Telehealth: Payer: Self-pay

## 2022-12-22 NOTE — Telephone Encounter (Signed)
Patient contacted office with complaints of lower abdominal pain that she describes as severe. Patient was seen in office for post op follow up on 12/21/22, patient states that she had fibroids removed on 11/29/22 and has been experiencing constipation since surgery. Patient states that she was advised yesterday to take laxative and Ibuprofen 800mg . Patient states that she did pass a bowel movement last night and described stool as soft, patient states that pain in her abdomen is not improving on medication and states that at times it is hard for her to sit because of pain. Patient is wanting to know if you would call in medication for pain relief to  [facility] on [address]? Please advise. KW

## 2022-12-23 MED ORDER — TRAMADOL HCL 50 MG PO TABS: 50.0000 mg | ORAL_TABLET | Freq: Four times a day (QID) | ORAL | 1 refills | Status: DC | PRN

## 2022-12-23 NOTE — Telephone Encounter (Signed)
Pin medication sent to pharmacy.

## 2022-12-24 ENCOUNTER — Emergency Department
Admission: EM | Admit: 2022-12-24 | Discharge: 2022-12-24 | Disposition: A | Discharge status: Home / Self Care | Payer: Medicaid Other | Attending: Student in an Organized Health Care Education/Training Program | Admitting: Student in an Organized Health Care Education/Training Program

## 2022-12-24 ENCOUNTER — Emergency Department: Payer: Medicaid Other

## 2022-12-24 DIAGNOSIS — D259 Leiomyoma of uterus, unspecified: Secondary | ICD-10-CM

## 2022-12-24 DIAGNOSIS — R1032 Left lower quadrant pain: Secondary | ICD-10-CM | POA: Diagnosis present

## 2022-12-24 DIAGNOSIS — R300 Dysuria: Secondary | ICD-10-CM | POA: Diagnosis not present

## 2022-12-24 LAB — URINALYSIS, ROUTINE W REFLEX MICROSCOPIC
Bilirubin Urine: NEGATIVE
Glucose, UA: NEGATIVE mg/dL
Ketones, ur: NEGATIVE mg/dL
Nitrite: NEGATIVE
Protein, ur: 30 mg/dL — AB
Specific Gravity, Urine: 1.028 (ref 1.005–1.030)
WBC, UA: >50 WBC/hpf (ref 0–5)
pH: 5 (ref 5.0–8.0)

## 2022-12-24 LAB — CBC
HCT: 33.2 % — ABNORMAL LOW (ref 36.0–46.0)
Hemoglobin: 10.2 g/dL — ABNORMAL LOW (ref 12.0–15.0)
MCH: 26.6 pg (ref 26.0–34.0)
MCHC: 30.7 g/dL (ref 30.0–36.0)
MCV: 86.7 fL (ref 80.0–100.0)
Platelets: 377 10*3/uL (ref 150–400)
RBC: 3.83 MIL/uL — ABNORMAL LOW (ref 3.87–5.11)
RDW: 15.5 % (ref 11.5–15.5)
WBC: 13.9 10*3/uL — ABNORMAL HIGH (ref 4.0–10.5)
nRBC: 0 % (ref 0.0–0.2)

## 2022-12-24 LAB — COMPREHENSIVE METABOLIC PANEL
ALT: 10 U/L (ref 0–44)
AST: 19 U/L (ref 15–41)
Albumin: 3.3 g/dL — ABNORMAL LOW (ref 3.5–5.0)
Alkaline Phosphatase: 74 U/L (ref 38–126)
Anion gap: 9 (ref 5–15)
BUN: 17 mg/dL (ref 6–20)
CO2: 24 mmol/L (ref 22–32)
Calcium: 8.6 mg/dL — ABNORMAL LOW (ref 8.9–10.3)
Chloride: 108 mmol/L (ref 98–111)
Creatinine, Ser: 0.94 mg/dL (ref 0.44–1.00)
GFR, Estimated: >60 mL/min (ref >60)
Glucose, Bld: 87 mg/dL (ref 70–99)
Potassium: 3.8 mmol/L (ref 3.5–5.1)
Sodium: 141 mmol/L (ref 135–145)
Total Bilirubin: 0.4 mg/dL (ref 0.3–1.2)
Total Protein: 7.1 g/dL (ref 6.5–8.1)

## 2022-12-24 LAB — LIPASE, BLOOD: Lipase: 28 U/L (ref 11–51)

## 2022-12-24 LAB — POC URINE PREG, ED: Preg Test, Ur: NEGATIVE

## 2022-12-24 MED ORDER — IOHEXOL 300 MG/ML  SOLN
100.0000 mL | Freq: Once | INTRAMUSCULAR | Status: AC | PRN
  Administered 2022-12-24: 100 mL via INTRAVENOUS

## 2022-12-24 MED ORDER — ONDANSETRON HCL 4 MG/2ML IJ SOLN
4.0000 mg | Freq: Once | INTRAMUSCULAR | Status: AC
  Administered 2022-12-24: 4 mg via INTRAVENOUS
  Filled 2022-12-24: qty 2

## 2022-12-24 MED ORDER — MORPHINE SULFATE (PF) 4 MG/ML IV SOLN
4.0000 mg | INTRAVENOUS | Status: DC | PRN
  Administered 2022-12-24: 4 mg via INTRAVENOUS
  Filled 2022-12-24: qty 1

## 2022-12-24 MED ORDER — SULFAMETHOXAZOLE-TRIMETHOPRIM 800-160 MG PO TABS
1.0000 | ORAL_TABLET | Freq: Once | ORAL | Status: AC
  Administered 2022-12-24: 1 via ORAL
  Filled 2022-12-24: qty 1

## 2022-12-24 MED ORDER — OXYCODONE-ACETAMINOPHEN 5-325 MG PO TABS
1.0000 | ORAL_TABLET | ORAL | Status: DC | PRN
  Administered 2022-12-24: 1 via ORAL
  Filled 2022-12-24: qty 1

## 2022-12-24 MED ORDER — SULFAMETHOXAZOLE-TRIMETHOPRIM 800-160 MG PO TABS: 1.0000 | ORAL_TABLET | Freq: Two times a day (BID) | ORAL | 0 refills | Status: AC

## 2022-12-24 MED ORDER — OXYCODONE-ACETAMINOPHEN 5-325 MG PO TABS: 1.0000 | ORAL_TABLET | ORAL | 0 refills | Status: DC | PRN

## 2022-12-24 NOTE — ED Provider Notes (Signed)
[facility] Provider Note    Event Date/Time   First MD Initiated Contact with Patient 12/24/22 2153     (approximate)   History   Post-op Problem   HPI  Nicole Gross is a 37 y.o. female status post recent surgery with OB/GYN for removal of uterine fibroids on the 12th of this month.  Having worsening left lower quadrant pain worse with movement.  Is having some discomfort with urination.  Denies any diarrhea.  No vomiting.     Physical Exam   Triage Vital Signs: ED Triage Vitals  Enc Vitals Group     BP 12/24/22 1943 (!) 129/90     Pulse Rate 12/24/22 1943 91     Resp 12/24/22 1943 20     Temp 12/24/22 1943 99.1 F (37.3 C)     Temp Source 12/24/22 1943 Oral     SpO2 12/24/22 1943 100 %     Weight --      Height --      Head Circumference --      Peak Flow --      Pain Score 12/24/22 1944 10     Pain Loc --      Pain Edu? --      Excl. in GC? --     Most recent vital signs: Vitals:   12/24/22 1943 12/24/22 2145  BP: (!) 129/90 109/66  Pulse: 91 88  Resp: 20   Temp: 99.1 F (37.3 C)   SpO2: 100% 100%     Constitutional: Alert  Eyes: Conjunctivae are normal.  Head: Atraumatic. Nose: No congestion/rhinnorhea. Mouth/Throat: Mucous membranes are moist.   Neck: Painless ROM.  Cardiovascular:   Good peripheral circulation. Respiratory: Normal respiratory effort.  No retractions.  Gastrointestinal: Soft without guarding or rebound tenderness but with tenderness to palpation the left lower quadrant. Musculoskeletal:  no deformity Neurologic:  MAE spontaneously. No gross focal neurologic deficits are appreciated.  Skin:  Skin is warm, dry and intact. No rash noted. Psychiatric: Mood and affect are normal. Speech and behavior are normal.    ED Results / Procedures / Treatments   Labs (all labs ordered are listed, but only abnormal results are displayed) Labs Reviewed  COMPREHENSIVE METABOLIC PANEL - Abnormal; Notable for the  following components:      Result Value   Calcium 8.6 (*)    Albumin 3.3 (*)    All other components within normal limits  CBC - Abnormal; Notable for the following components:   WBC 13.9 (*)    RBC 3.83 (*)    Hemoglobin 10.2 (*)    HCT 33.2 (*)    All other components within normal limits  URINALYSIS, ROUTINE W REFLEX MICROSCOPIC - Abnormal; Notable for the following components:   Color, Urine YELLOW (*)    APPearance HAZY (*)    Hgb urine dipstick MODERATE (*)    Protein, ur 30 (*)    Leukocytes,Ua LARGE (*)    Bacteria, UA RARE (*)    All other components within normal limits  LIPASE, BLOOD  POC URINE PREG, ED     EKG     RADIOLOGY Please see ED Course for my review and interpretation.  I personally reviewed all radiographic images ordered to evaluate for the above acute complaints and reviewed radiology reports and findings.  These findings were personally discussed with the patient.  Please see medical record for radiology report.    PROCEDURES:  Critical Care performed: No  Procedures  MEDICATIONS ORDERED IN ED: Medications  oxyCODONE-acetaminophen (PERCOCET/ROXICET) 5-325 MG per tablet 1 tablet (1 tablet Oral Given 12/24/22 1949)  morphine (PF) 4 MG/ML injection 4 mg (4 mg Intravenous Given 12/24/22 2214)  ondansetron (ZOFRAN) injection 4 mg (4 mg Intravenous Given 12/24/22 2213)  iohexol (OMNIPAQUE) 300 MG/ML solution 100 mL (100 mLs Intravenous Contrast Given 12/24/22 2225)  sulfamethoxazole-trimethoprim (BACTRIM DS) 800-160 MG per tablet 1 tablet (1 tablet Oral Given 12/24/22 2248)     IMPRESSION / MDM / ASSESSMENT AND PLAN / ED COURSE  I reviewed the triage vital signs and the nursing notes.                              Differential diagnosis includes, but is not limited to, abscess, cellulitis, perforation, colitis, diverticulitis, UTI, pyelonephritis, PID  Patient presenting to the ER for evaluation of symptoms as described above.  Based on  symptoms, risk factors and considered above differential, this presenting complaint could reflect a potentially life-threatening illness therefore the patient will be placed on continuous pulse oximetry and telemetry for monitoring.  Laboratory evaluation will be sent to evaluate for the above complaints.  CT imaging will be ordered for above differential.  Will give fluids as well as IV pain medication.   Clinical Course as of 12/24/22 2252  Sun Dec 24, 2022  2248 CT imaging without evidence of postoperative complication.  Noted fibroid finding discussed with the patient.  I do suspect her symptoms are secondary to UTI.  She nontoxic-appearing and tolerating p.o.  Will be placed on antibiotic.  This to be sent to her pharmacy.  Will be given short course of pain medication.  Discussed outpatient follow-up.  Patient agreeable plan. [PR]    Clinical Course User Index [PR] Willy Eddy, MD     FINAL CLINICAL IMPRESSION(S) / ED DIAGNOSES   Final diagnoses:  LLQ abdominal pain  Uterine leiomyoma, unspecified location  Dysuria     Rx / DC Orders   ED Discharge Orders          Ordered    sulfamethoxazole-trimethoprim (BACTRIM DS) 800-160 MG tablet  2 times daily        12/24/22 2251    oxyCODONE-acetaminophen (PERCOCET) 5-325 MG tablet  Every 4 hours PRN        12/24/22 2251             Note:  This document was prepared using Dragon voice recognition software and may include unintentional dictation errors.    Willy Eddy, MD 12/24/22 2252

## 2022-12-24 NOTE — Discharge Instructions (Signed)

## 2022-12-24 NOTE — ED Triage Notes (Signed)
Pt states that she has fibroids removed on May 22nd and since has been discharge and lower left side cramping.

## 2022-12-27 ENCOUNTER — Encounter: Payer: Medicaid Other | Admitting: Obstetrics and Gynecology

## 2022-12-27 ENCOUNTER — Other Ambulatory Visit: Payer: Self-pay | Admitting: Obstetrics and Gynecology

## 2022-12-27 MED ORDER — LUPRON DEPOT (3-MONTH) 11.25 MG IM KIT: 11.2500 mg | PACK | INTRAMUSCULAR | 0 refills | Status: DC

## 2022-12-27 MED ORDER — ORIAHNN 300-1-0.5 & 300 MG PO CPPK: 1.0000 | ORAL_CAPSULE | Freq: Two times a day (BID) | ORAL | 11 refills | Status: DC

## 2022-12-27 MED ORDER — LUPRON DEPOT (3-MONTH) 11.25 MG IM KIT: 11.2500 mg | PACK | INTRAMUSCULAR | 2 refills | Status: DC

## 2022-12-27 NOTE — Telephone Encounter (Signed)
None of her fibroids were technically "removed".  With the procedure she had, they were ablated, which means the will dissolve over time (usually a period of 2-3 months). This is why we follow up with an ultrasound to see if a second treatment is needed. To have the fibroids removed would have been a different procedure with a longer recovery period which she did not want.  Additionally, if she is still taking the Myfembree, this will also help the fibroids to go away faster .

## 2022-12-27 NOTE — Addendum Note (Signed)
Addended by: Fabian November on: 12/27/2022 05:18 PM   Modules accepted: Orders

## 2022-12-27 NOTE — Telephone Encounter (Signed)
She may be able to tolerate Nicole Gross (which is similar to the [facility] but has less side effects reported). Typically other medications that help with bleeding and shrinking the fibroids simultaneously are injectables.  If she desires to utilize this, I will need to place an order and the medication is shipped from the specialty pharmacy, usually takes a week to arrive. If she would like, I can order this for her. Otherwise, I have sent in the [facility] for her to try.

## 2022-12-27 NOTE — Telephone Encounter (Signed)
Ok, the prescription has been sent. We will notify her when her injection arrives to the office.

## 2022-12-27 NOTE — Telephone Encounter (Signed)
Pt calling; went to the ER ~ 2d ago; was told she still had a fibroid that wasn't removed when she had surgery on the 22nd and would need another surgery.  Would like to talk with [facility] about this.  [phone]

## 2023-01-31 ENCOUNTER — Telehealth: Payer: Self-pay

## 2023-01-31 NOTE — Telephone Encounter (Signed)
That is a very unusual color for discharge. I would have her follow up in office for a swab, then can treat with Flagyl (for possible BV while awaiting the culture).

## 2023-02-01 NOTE — Telephone Encounter (Signed)
I reached out to patient to get her scheduled for a nurse visit for self swab on 02/02/23 at 8:00 CB was working her in. When I called the patient didn't answer the phone and the mailbox was full.

## 2023-02-02 NOTE — Telephone Encounter (Signed)
The patient contacted the office and scheduled for 7/29 for nurse visit.

## 2023-02-05 ENCOUNTER — Ambulatory Visit: Payer: Medicaid Other

## 2023-02-05 NOTE — Progress Notes (Deleted)
    NURSE VISIT NOTE  Subjective:    Patient ID: Dahliah Guillemette, female    DOB: [birth year], 37 y.o.   MRN: [id number]  HPI  Patient is a 37 y.o. G42P0050 female who presents for {pe vag discharge desc:315065} vaginal discharge for *** {gen duration:315003}. Denies abnormal vaginal bleeding or significant pelvic pain or fever. {Actions; denies/reports/admits to:19208} {UTI Symptoms:210800002}. Patient {has/denies:315300} history of known exposure to STD.   Objective:    There were no vitals taken for this visit.   @THIS  VISIT ONLY@  Assessment:   No diagnosis found.  {vaginitis type:315262}  Plan:   GC and chlamydia DNA  probe sent to lab. Treatment: {vaginitis tx:315263} ROV prn if symptoms persist or worsen.   Fonda Kinder, CMA

## 2023-03-20 ENCOUNTER — Ambulatory Visit (INDEPENDENT_AMBULATORY_CARE_PROVIDER_SITE_OTHER): Payer: PRIVATE HEALTH INSURANCE | Admitting: Obstetrics and Gynecology

## 2023-03-20 ENCOUNTER — Encounter: Payer: Self-pay | Admitting: Obstetrics and Gynecology

## 2023-03-20 ENCOUNTER — Other Ambulatory Visit (HOSPITAL_COMMUNITY)
Admission: RE | Admit: 2023-03-20 | Discharge: 2023-03-20 | Disposition: A | Discharge status: Home / Self Care | Payer: PRIVATE HEALTH INSURANCE | Source: Ambulatory Visit | Attending: Obstetrics and Gynecology | Admitting: Obstetrics and Gynecology

## 2023-03-20 VITALS — BP 128/89 | HR 74 | Resp 16 | Ht 59.0 in | Wt 171.7 lb

## 2023-03-20 DIAGNOSIS — D259 Leiomyoma of uterus, unspecified: Secondary | ICD-10-CM

## 2023-03-20 DIAGNOSIS — N898 Other specified noninflammatory disorders of vagina: Secondary | ICD-10-CM | POA: Insufficient documentation

## 2023-03-20 DIAGNOSIS — Z8742 Personal history of other diseases of the female genital tract: Secondary | ICD-10-CM

## 2023-03-20 DIAGNOSIS — N920 Excessive and frequent menstruation with regular cycle: Secondary | ICD-10-CM

## 2023-03-20 DIAGNOSIS — Z3042 Encounter for surveillance of injectable contraceptive: Secondary | ICD-10-CM

## 2023-03-20 DIAGNOSIS — N979 Female infertility, unspecified: Secondary | ICD-10-CM

## 2023-03-20 DIAGNOSIS — Z9079 Acquired absence of other genital organ(s): Secondary | ICD-10-CM

## 2023-03-20 DIAGNOSIS — N921 Excessive and frequent menstruation with irregular cycle: Secondary | ICD-10-CM

## 2023-03-20 DIAGNOSIS — Z3202 Encounter for pregnancy test, result negative: Secondary | ICD-10-CM | POA: Diagnosis not present

## 2023-03-20 DIAGNOSIS — D219 Benign neoplasm of connective and other soft tissue, unspecified: Secondary | ICD-10-CM

## 2023-03-20 DIAGNOSIS — R102 Pelvic and perineal pain: Secondary | ICD-10-CM

## 2023-03-20 LAB — POCT URINE PREGNANCY: Preg Test, Ur: NEGATIVE

## 2023-03-20 MED ORDER — MEDROXYPROGESTERONE ACETATE 150 MG/ML IM SUSY
150.0000 mg | PREFILLED_SYRINGE | Freq: Once | INTRAMUSCULAR | Status: AC
Start: 2023-03-20 — End: 2023-03-20
  Administered 2023-03-20: 150 mg via INTRAMUSCULAR

## 2023-03-20 NOTE — Progress Notes (Signed)
GYNECOLOGY PROGRESS NOTE  Subjective:    Patient ID: Nicole Gross, female    DOB: Jun 21, 1986, 37 y.o.   MRN: [id number]  HPI  Patient is a 37 y.o. G60P0050 female who presents for complaints of vaginal odor with discharge. Also has been noting some left-sided pelvic pain for the past 4 days, intermittent. Patient does have a history of fibroid uterus, PID, and right salpingectomy for ectopic pregnancy.  Has questions about further evaluation of infertility as well, noting that her partner desires conception. Partner currently has 6 outside children.   Of note, patient had treatment with radiofrequency ablation of her fibroids in May 2024, as well as removal of an endometrial polyp.  Notes that her bleeding is about the same, except she now has cycles that last 10-12 days.  Patient's last menstrual period was 03/01/2023 (exact date).  Is overdue for follow up after procedure.    Past Surgical History:  Procedure Laterality Date   DIAGNOSTIC LAPAROSCOPY WITH REMOVAL OF ECTOPIC PREGNANCY Right 12/19/2020   Procedure: DIAGNOSTIC LAPAROSCOPY WITH REMOVAL OF ECTOPIC PREGNANCY;  Surgeon: Hildred Laser, MD;  Location: [facility] ORS;  Service: Gynecology;  Laterality: Right;   DILATATION & CURETTAGE/HYSTEROSCOPY WITH MYOSURE  11/27/2022   Procedure: DILATATION & CURETTAGE/HYSTEROSCOPY WITH MYOSURE;  Surgeon: Hildred Laser, MD;  Location: [facility] ORS;  Service: Gynecology;;   ECTOPIC PREGNANCY SURGERY     h/o 1 ectopic on each side     The following portions of the patient's history were reviewed and updated as appropriate: allergies, current medications, past family history, past medical history, past social history, past surgical history, and problem list.  Review of Systems Pertinent items noted in HPI and remainder of comprehensive ROS otherwise negative.   Objective:   Blood pressure (!) 123/90, pulse 75, resp. rate 16, height 4\' 11"  (1.499 m), weight 171 lb 11.2 oz (77.9 kg), unknown if currently  breastfeeding. Body mass index is 34.68 kg/m. General appearance: alert, cooperative, and no distress Abdomen: soft, non-tender; bowel sounds normal; no masses,  no organomegaly Pelvic: external genitalia normal, rectovaginal septum normal.  Vagina with small amount of yellow thin discharge.  Cervix normal appearing, no lesions and mild motion tenderness.  Uterus mobile, nontender, normal shape and size.  Adnexae non-palpable, nontender bilaterally.  Extremities: extremities normal, atraumatic, no cyanosis or edema Neurologic: Grossly normal   Labs:  Results for orders placed or performed in visit on 03/20/23  POCT urine pregnancy  Result Value Ref Range   Preg Test, Ur Negative Negative    Assessment:   1. Encounter for Depo-Provera contraception   2. Vaginal odor   3. Infertility, female   4. Pelvic pain   5. History of PID   6. H/O unilateral salpingectomy right 12/19/20   7. Fibroids   8. Menorrhagia with regular cycle      Plan:   1. Vaginal odor - No odor noted on exam today but will perform vaginitis screening today.  - Cervicovaginal ancillary only  2. Infertility, female - Patient desires to undergo further workup of infertility. Has a history of PID, fibroids, and prior ectopic with right salpingectomy. Discussed need for further evaluation of remaining fallopian tube as on last laparoscopy 2 years ago, moderate scar tissue and several fibroids noted.  - DG Hysterogram (HSG); Future  3. Pelvic pain - Unclear cause, may be secondary to fibroids, vaginitis, etc. Will await culture results.   4. History of PID - May be a cause of her infertility thus far.  Vaginal screening prescribed today in light of symptoms.   5. H/O unilateral salpingectomy right 12/19/20 - Only 1 viable fallopian tube, for HSG to assess patency.   6. Fibroids - History of ultrasound guided radiofrequency ablation of the fibroids in May. Is due for f/u ultrasound, but will wait until after HSG  as some evidence of the fibroids may be seen on this imaging as well.   7. Menorrhagia with regular cycle - Discussed other management options of her bleeding and fibroids. Patient has tried Lupron and Myfembree in the past prior to her surgery, and noted significant GI issues. Offered other methods of hormonal suppression including supplemental progesterone (Aygestin, Depo Provera), or trial of different GnRH agonist Milinda Pointer).  Patient ok to trial Depo Provera. Initial dose given.  - POCT urine pregnancy - medroxyPROGESTERone Acetate SUSY 150 mg     Hildred Laser, MD [facility]

## 2023-03-21 LAB — CERVICOVAGINAL ANCILLARY ONLY
Bacterial Vaginitis (gardnerella): POSITIVE — AB
Candida Glabrata: NEGATIVE
Candida Vaginitis: NEGATIVE
Chlamydia: NEGATIVE
Comment: NEGATIVE
Comment: NEGATIVE
Comment: NEGATIVE
Comment: NEGATIVE
Comment: NEGATIVE
Comment: NORMAL
Neisseria Gonorrhea: NEGATIVE
Trichomonas: POSITIVE — AB

## 2023-03-22 ENCOUNTER — Other Ambulatory Visit: Payer: Self-pay | Admitting: Obstetrics and Gynecology

## 2023-03-22 ENCOUNTER — Encounter: Payer: Self-pay | Admitting: Obstetrics and Gynecology

## 2023-03-22 DIAGNOSIS — A599 Trichomoniasis, unspecified: Secondary | ICD-10-CM

## 2023-03-22 DIAGNOSIS — B9689 Other specified bacterial agents as the cause of diseases classified elsewhere: Secondary | ICD-10-CM

## 2023-03-22 MED ORDER — METRONIDAZOLE 500 MG PO TABS
500.0000 mg | ORAL_TABLET | Freq: Two times a day (BID) | ORAL | 1 refills | Status: DC
Start: 2023-03-22 — End: 2023-07-24

## 2023-04-05 ENCOUNTER — Telehealth: Payer: Self-pay

## 2023-04-05 NOTE — Telephone Encounter (Signed)
Pt calling; is having abd pain; appendicitis vs ectopic.  [phone]  Pt states this is the third day of having this pain; states she is also bleeding - not sure if from depo or not.  Adv she can expect some irreg bleeding while her body adjusts to depo; as long as she doesn't saturate a pad in 30 minutes to 1 hr she's okay.  As for the possible appendicitis vs ectopic adv pt to go to ER as they can evaluate her better there.

## 2023-04-06 NOTE — Telephone Encounter (Signed)
Pt called triage asking if an additional Rx for Trich could be sent in cause she is still having sx and pelvic pain. She is having vaginal itching and irritation but her main concern is pelvic pain. Has been taking tylenol every 8 hrs and it is not helping. She did go to urgent care but they told her they couldn't do anything for her there. I advised to try ER for further evaluation and I also advised 7 day OTC Monistat for vaginal sx.

## 2023-04-12 ENCOUNTER — Ambulatory Visit: Payer: PRIVATE HEALTH INSURANCE

## 2023-04-12 ENCOUNTER — Ambulatory Visit (INDEPENDENT_AMBULATORY_CARE_PROVIDER_SITE_OTHER): Payer: PRIVATE HEALTH INSURANCE

## 2023-04-12 ENCOUNTER — Other Ambulatory Visit (HOSPITAL_COMMUNITY)
Admission: RE | Admit: 2023-04-12 | Discharge: 2023-04-12 | Disposition: A | Discharge status: Home / Self Care | Payer: PRIVATE HEALTH INSURANCE | Source: Ambulatory Visit

## 2023-04-12 VITALS — BP 134/82 | HR 88 | Wt 179.1 lb

## 2023-04-12 DIAGNOSIS — N898 Other specified noninflammatory disorders of vagina: Secondary | ICD-10-CM

## 2023-04-12 NOTE — Progress Notes (Signed)
   GYN ENCOUNTER  Encounter for evaluation vaginal irritation.  Subjective  HPI: Nicole Gross is a 37 y.o. G6P0050 who presents today for evaluation of vaginal irritation.   States that she has same symptoms as previously when she tested positive for BV and trich. Symptoms started about 3 days ago after she had intercourse and the condom broke. Her partner has not been treated for Trich that was previously diagnosed.   Past Medical History:  Diagnosis Date   Anemia    Asthma    Ectopic pregnancy    x 3   Fibroid uterus 11/2022   Pelvic inflammatory disease (PID)    Past Surgical History:  Procedure Laterality Date   DIAGNOSTIC LAPAROSCOPY WITH REMOVAL OF ECTOPIC PREGNANCY Right 12/19/2020   Procedure: DIAGNOSTIC LAPAROSCOPY WITH REMOVAL OF ECTOPIC PREGNANCY;  Surgeon: Hildred Laser, MD;  Location: [facility] ORS;  Service: Gynecology;  Laterality: Right;   DILATATION & CURETTAGE/HYSTEROSCOPY WITH MYOSURE  11/27/2022   Procedure: DILATATION & CURETTAGE/HYSTEROSCOPY WITH MYOSURE;  Surgeon: Hildred Laser, MD;  Location: [facility] ORS;  Service: Gynecology;;   ECTOPIC PREGNANCY SURGERY     h/o 1 ectopic on each side   OB History     Gravida  6   Para  0   Term      Preterm      AB  5   Living         SAB  3   IAB      Ectopic  2   Multiple      Live Births             Allergies  Allergen Reactions   Amoxicillin Anaphylaxis and Rash   Ciprofloxacin Shortness Of Breath and Itching   Penicillins Anaphylaxis and Rash    Has patient had a PCN reaction causing immediate rash, facial/tongue/throat swelling, SOB or lightheadedness with hypotension: yes: Has patient had a PCN reaction causing severe rash involving mucus membranes or skin necrosis: no Has patient had a PCN reaction that required hospitalization no Has patient had a PCN reaction occurring within the last 10 years: no1} If all of the above answers are "NO", then may proceed with Cephalosporin use.     Codeine Itching    Review of Systems  12 point ROS negative except for pertinent positives noted in HPO above.   Objective  BP (!) 129/90   Pulse 85   Wt 179 lb 1.6 oz (81.2 kg)   LMP 03/01/2023 (Exact Date)   BMI 36.17 kg/m   Physical examination GENERAL APPEARANCE: alert, well appearing LUNGS: cough, normal work of breathing HEART: Blood pressure initially elevated, normotensive on recheck.   Assessment/Plan - Patient self-collected swab to test for BV and Trich. Will follow up as needed.   Lindalou Hose Amori Hypolite, CNM  04/12/23 3:49 PM

## 2023-04-16 LAB — CERVICOVAGINAL ANCILLARY ONLY
Bacterial Vaginitis (gardnerella): POSITIVE — AB
Candida Glabrata: NEGATIVE
Candida Vaginitis: NEGATIVE
Chlamydia: NEGATIVE
Comment: NEGATIVE
Comment: NEGATIVE
Comment: NEGATIVE
Comment: NEGATIVE
Comment: NEGATIVE
Comment: NORMAL
Neisseria Gonorrhea: NEGATIVE
Trichomonas: NEGATIVE

## 2023-04-16 MED ORDER — METRONIDAZOLE 500 MG PO TABS
500.0000 mg | ORAL_TABLET | Freq: Two times a day (BID) | ORAL | 0 refills | Status: AC
Start: 2023-04-16 — End: 2023-04-23

## 2023-04-16 NOTE — Addendum Note (Signed)
Addended by: Autumn Messing on: 04/16/2023 02:08 PM   Modules accepted: Orders

## 2023-06-11 ENCOUNTER — Telehealth: Payer: Self-pay

## 2023-06-11 NOTE — Telephone Encounter (Signed)
Pt called triage asking to schedule an appointment. Pls call pt to schedule appt.

## 2023-06-14 ENCOUNTER — Ambulatory Visit: Payer: PRIVATE HEALTH INSURANCE | Admitting: Obstetrics and Gynecology

## 2023-06-14 NOTE — Progress Notes (Unsigned)
    GYNECOLOGY PROGRESS NOTE  Subjective:    Patient ID: Nicole Gross, female    DOB: October 12, 1985, 37 y.o.   MRN: [id number]  HPI  Patient is a 37 y.o. G52P0050 female who presents for hospital follow up for degenerative fibroids. She was evaluated at [facility] on 06/09/2023, she presented to ED with abdominal pain and stated that she has had 2 days of sudden-onset, constant periumbilical pain. She reports she has had similar pain with her prior uterine fibroids. She most recently had abdominal surgery for this in 11/2022. States she has had 3 ectopic pregnancies which have all required surgery and is status post right salpingectomy of bilateral history of ectopics. She was previously on the Depo-Provera injection, with last injection 4 months ago. Reports her LMP is unknown.  {Common ambulatory SmartLinks:19316}  Review of Systems {ros; complete:30496}   Objective:   There were no vitals taken for this visit. There is no height or weight on file to calculate BMI. General appearance: {general exam:16600} Abdomen: {abdominal exam:16834} Pelvic: {pelvic exam:16852::"cervix normal in appearance","external genitalia normal","no adnexal masses or tenderness","no cervical motion tenderness","rectovaginal septum normal","uterus normal size, shape, and consistency","vagina normal without discharge"} Extremities: {extremity exam:5109} Neurologic: {neuro exam:17854}   Assessment:   No diagnosis found.   Plan:   There are no diagnoses linked to this encounter.     Hildred Laser, MD [facility]

## 2023-06-18 ENCOUNTER — Encounter: Payer: Self-pay | Admitting: Obstetrics and Gynecology

## 2023-07-16 NOTE — Progress Notes (Deleted)
    GYNECOLOGY PROGRESS NOTE  Subjective:    Patient ID: Nicole Gross, female    DOB: Jan 12, 1986, 38 y.o.   MRN: [id number]  HPI  Patient is a 38 y.o. G51P0050 female who presents for evaluation of fibroid pain and depo injection.  {Common ambulatory SmartLinks:19316}  Review of Systems {ros; complete:30496}   Objective:   There were no vitals taken for this visit. There is no height or weight on file to calculate BMI. General appearance: {general exam:16600} Abdomen: {abdominal exam:16834} Pelvic: {pelvic exam:16852::cervix normal in appearance,external genitalia normal,no adnexal masses or tenderness,no cervical motion tenderness,rectovaginal septum normal,uterus normal size, shape, and consistency,vagina normal without discharge} Extremities: {extremity exam:5109} Neurologic: {neuro exam:17854}   Assessment:   No diagnosis found.   Plan:   There are no diagnoses linked to this encounter.    Archie Savers, MD [facility]

## 2023-07-18 ENCOUNTER — Ambulatory Visit: Payer: Medicaid Other | Admitting: Obstetrics and Gynecology

## 2023-07-23 NOTE — Progress Notes (Signed)
 GYNECOLOGY PROGRESS NOTE  Subjective:    Patient ID: Nicole Gross, female    DOB: [birth year], 38 y.o.   MRN: [id number]  HPI  Patient is a 38 y.o. G43P0050 female who presents for follow-up evaluation of fibroid pain and depo injection.  She is status post hysteroscopic fibroid ablation in May 2024.  Patient has been noted to have intermittent bouts of pain over the past year with several ER visits.  Most recently had a visit to [facility] on 06/09/2023.  Had CT scan done which noted that patient was experiencing degeneration of fibroid.  Today also presents for Depo-Provera  injection for management of her menstrual cycles and fibroids.  Last Depo-Provera  injection was 03/20/2023.  Patient reports pain that last for approximately several weeks intermittently and will go away for 2 to 3 months at a time.  Has tried which does not help much.  Motrin  and heating pads do help some however she still notes days where her pain is unbearable to the point where she is doubled over.   Has tried tramadol  in the past however notes that this medication makes her very sick.  Also has tried oxycodone  which she does notes helps for her significant pain.     The following portions of the patient's history were reviewed and updated as appropriate: allergies, current medications, past family history, past medical history, past social history, past surgical history, and problem list.  Review of Systems Pertinent items noted in HPI and remainder of comprehensive ROS otherwise negative.   Objective:   Blood pressure 123/76, pulse 80, height 4' 11 (1.499 m), weight 185 lb 8 oz (84.1 kg).  She has been regularly taking the medicine consistently for 2 weeks evaluated her to stay take every day although she still has a low that he probably That he uses marijuana about every preoperatively Body mass index is 37.47 kg/m. General appearance: alert, cooperative, and no distress Abdomen: soft, non-tender; bowel  sounds normal; no masses,  no organomegaly Pelvic: deferred Extremities: extremities normal, atraumatic, no cyanosis or edema Neurologic: Grossly normal  Imaging (06/09/2023):   CT Abdomen Pelvis W IV Contrast Only  Anatomical Region Laterality Modality  Abdomen -- Computed Tomography  Pelvis -- --   Impression  1.  Multiple uterine fibroids with hypodense centers and parametrial stranding, suspicious for active fibroid degeneration. 2.  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 Additional subcentimeter hyperattenuation along the hepatic dome may represent hemangiomas. Narrative  This result has an attachment that is not available. EXAM: CT ABDOMEN PELVIS W CONTRAST ACCESSION: [id number] UN REPORT DATE: 06/09/2023 9:17 PM CLINICAL INDICATION: 38 years old with RLQ and suprapubic pain    COMPARISON: None  TECHNIQUE: A helical CT scan of the abdomen and pelvis was obtained following IV contrast from the lung bases through the pubic symphysis. Images were reconstructed in the axial plane. Coronal and sagittal reformatted images were also provided for further evaluation.   FINDINGS:  LOWER CHEST: Unremarkable.  LIVER: Normal liver contour. 5.3 x 5.8 x 4.5 cm right peripherally isoattenuating hepatic dome lesion with central hypoattenuation, favor focal nodular hyperplasia (2:19, 4:69). Multiple subcentimeter areas of hyperattenuation along the hepatic dome (2:11, 16, 17), possibly hemangiomas.SABRA  BILIARY: The gallbladder is normal in appearance. No biliary ductal dilatation.    SPLEEN: Normal in size and contour.  PANCREAS: Normal pancreatic contour.  No focal lesions.  No ductal dilation.  ADRENAL GLANDS: Normal appearance of the adrenal  glands.  KIDNEYS/URETERS: Symmetric renal enhancement.  No hydronephrosis.  No solid renal mass. Left-sided subcentimeter hypodense lesions which are too small to characterize.  BLADDER:  Unremarkable.  REPRODUCTIVE ORGANS: Multifibroid uterus. The largest fibroid measures up to 3.9 cm in size within the left uterine body (2:112). The fibroids exhibit confluent central hypodensity with prominent parametrial stranding predominantly within the region surrounding the uterine body. Ovaries are unremarkable.  GI TRACT: No findings of bowel obstruction. Mild mural thickening of the distal esophagus can be seen with reflux esophagitis. Bowel wall thickening and mild mural enhancement as well as pericolonic stranding/edema involving the short segment of small bowel within the lower abdomen/pelvis, favored reactive in the setting of parametrial inflammatory changes. (2:107, 5:67). Normal appendix. No evidence of acute colonic pathology.  PERITONEUM, RETROPERITONEUM AND MESENTERY: No free air. Small volume simple appearing pelvic free fluid. No fluid collection.  LYMPH NODES: No adenopathy.  VESSELS: Hepatic and portal veins are patent.  Normal caliber aorta.    BONES and SOFT TISSUES: No aggressive osseous lesions. Mild dependent body wall edema. Small fat-containing umbilical hernia.    US  PELVIC COMPLETE W TRANSVAGINAL AND TORSION R/O (11/02/2022) CLINICAL DATA:  Acute pelvic pain.   EXAM: TRANSABDOMINAL AND TRANSVAGINAL ULTRASOUND OF PELVIS   DOPPLER ULTRASOUND OF OVARIES   TECHNIQUE: Both transabdominal and transvaginal ultrasound examinations of the pelvis were performed. Transabdominal technique was performed for global imaging of the pelvis including uterus, ovaries, adnexal regions, and pelvic cul-de-sac.   It was necessary to proceed with endovaginal exam following the transabdominal exam to visualize the uterus, endometrium, bilateral ovaries and bilateral adnexa. Color and duplex Doppler ultrasound was utilized to evaluate blood flow to the ovaries.   COMPARISON:  None Available.   FINDINGS: Uterus   Measurements: 13.7 cm x 7.3 cm x 8.8 cm = volume: 459.17  mL. Multiple heterogeneous uterine fibroids are seen. The largest measures 8.3 cm x 6.6 cm x 6.3 cm and is seen along the posterior aspect of the uterus. 3.1 cm x 3.6 cm x 3.0 cm and 3.2 cm x 3.1 cm x 2.7 cm uterine fibroids are also seen.   Endometrium   Thickness: 7.3 mm.  No focal abnormality visualized.   Right ovary   Measurements: 2.3 cm x 1.5 cm x 2.9 cm = volume: 5.3 mL. Normal appearance/no adnexal mass.   Left ovary   Measurements: 3.2 cm x 1.7 cm x 2.8 cm = volume: 7.6 mL. Normal appearance/no adnexal mass.   Pulsed Doppler evaluation of both ovaries demonstrates normal low-resistance arterial and venous waveforms.   Other findings   A small amount of pelvic free fluid is seen.   IMPRESSION: Large heterogeneous uterine fibroids.     Electronically Signed   By: Suzen Dials M.D.   On: 11/03/2022 00:39     Assessment:   1. Pelvic pain   2. Menorrhagia with regular cycle   3. Fibroids      Plan:   1. Pelvic pain (Primary) -Discussed management of patient's intermittent pain. Likely secondary to degeneration of fibroids as evidenced by most recent imaging.  Advised to continue to alternate with Tylenol  and Motrin  and use of heating pads, can also prescribe short supply of Oxycodone  (has tried Tramadol  which makes her sick) for severe pain.  - Recommend follow up again with imaging at the 1 year mark of her surgery to assess for further improvement of her fibroids.  - US  PELVIC COMPLETE WITH TRANSVAGINAL; Future  2. Menorrhagia with regular  cycle - Currently managing with Depo Provera . Overdue for injection today.  - POCT urine pregnancy perfromed today negative, prior to injection administration.    3. Fibroids - Continue use of Depo Provera  s/p fibroid ablation procedure. Appear to be reducing in size as anticipated.  - Recommend follow up again with imaging at the 1 year mark of her surgery to assess for further improvement of her fibroids.  -  US  PELVIC COMPLETE WITH TRANSVAGINAL; Future    Return in about 4 months (around 11/21/2023) for ultrasound and f/u with MD.   A total of 36 minutes were spent during this encounter, including review of previous progress notes, recent imaging and labs, face-to-face with time with patient involving counseling and coordination of care, as well as documentation for current visit.  Archie Savers, MD [facility]

## 2023-07-24 ENCOUNTER — Encounter: Payer: Self-pay | Admitting: Obstetrics and Gynecology

## 2023-07-24 ENCOUNTER — Ambulatory Visit (INDEPENDENT_AMBULATORY_CARE_PROVIDER_SITE_OTHER): Payer: PRIVATE HEALTH INSURANCE | Admitting: Obstetrics and Gynecology

## 2023-07-24 VITALS — BP 123/76 | HR 80 | Ht 59.0 in | Wt 185.5 lb

## 2023-07-24 DIAGNOSIS — N920 Excessive and frequent menstruation with regular cycle: Secondary | ICD-10-CM | POA: Diagnosis not present

## 2023-07-24 DIAGNOSIS — R102 Pelvic and perineal pain unspecified side: Secondary | ICD-10-CM

## 2023-07-24 DIAGNOSIS — Z3042 Encounter for surveillance of injectable contraceptive: Secondary | ICD-10-CM

## 2023-07-24 DIAGNOSIS — D259 Leiomyoma of uterus, unspecified: Secondary | ICD-10-CM

## 2023-07-24 DIAGNOSIS — D219 Benign neoplasm of connective and other soft tissue, unspecified: Secondary | ICD-10-CM

## 2023-07-24 DIAGNOSIS — Z3202 Encounter for pregnancy test, result negative: Secondary | ICD-10-CM | POA: Diagnosis not present

## 2023-07-24 LAB — POCT URINE PREGNANCY: Preg Test, Ur: NEGATIVE

## 2023-07-24 MED ORDER — MEDROXYPROGESTERONE ACETATE 150 MG/ML IM SUSP
150.0000 mg | Freq: Once | INTRAMUSCULAR | Status: AC
  Administered 2023-07-24: 150 mg via INTRAMUSCULAR

## 2023-07-24 MED ORDER — OXYCODONE HCL 5 MG PO TABS: 5.0000 mg | ORAL_TABLET | Freq: Four times a day (QID) | ORAL | 0 refills | Status: AC | PRN

## 2023-07-24 NOTE — Patient Instructions (Signed)

## 2023-08-30 ENCOUNTER — Ambulatory Visit: Payer: Medicaid Other

## 2023-09-04 ENCOUNTER — Encounter: Payer: Self-pay | Admitting: Nurse Practitioner

## 2023-09-04 ENCOUNTER — Ambulatory Visit: Payer: Medicaid Other | Admitting: Nurse Practitioner

## 2023-09-04 VITALS — HR 76

## 2023-09-04 DIAGNOSIS — Z113 Encounter for screening for infections with a predominantly sexual mode of transmission: Secondary | ICD-10-CM | POA: Diagnosis not present

## 2023-09-04 LAB — WET PREP FOR TRICH, YEAST, CLUE
Trichomonas Exam: NEGATIVE
Yeast Exam: NEGATIVE

## 2023-09-04 LAB — HM HIV SCREENING LAB: HM HIV Screening: NEGATIVE

## 2023-09-04 LAB — HM HEPATITIS C SCREENING LAB: HM Hepatitis Screen: NEGATIVE

## 2023-09-04 LAB — HEPATITIS B SURFACE ANTIGEN
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
## 2023-09-04 NOTE — Progress Notes (Signed)
 Pt is here for std screening.  Wet prep results reviewed with pt, no treatment required per standing order. Gaspar Garbe, RN

## 2023-09-06 ENCOUNTER — Encounter: Payer: Self-pay | Admitting: Pulmonary Disease

## 2023-09-06 ENCOUNTER — Ambulatory Visit: Payer: PRIVATE HEALTH INSURANCE | Admitting: Pulmonary Disease

## 2023-09-06 VITALS — BP 114/80 | HR 101 | Temp 98.8°F | Ht 59.0 in | Wt 185.4 lb

## 2023-09-06 DIAGNOSIS — J4541 Moderate persistent asthma with (acute) exacerbation: Secondary | ICD-10-CM

## 2023-09-06 MED ORDER — FLUTICASONE-SALMETEROL 230-21 MCG/ACT IN AERO
2.0000 | INHALATION_SPRAY | Freq: Two times a day (BID) | RESPIRATORY_TRACT | 3 refills | Status: AC
Start: 2023-09-06 — End: ?

## 2023-09-06 MED ORDER — AIRSUPRA 90-80 MCG/ACT IN AERO
2.0000 | INHALATION_SPRAY | Freq: Four times a day (QID) | RESPIRATORY_TRACT | 3 refills | Status: AC
Start: 2023-09-06 — End: ?

## 2023-09-06 NOTE — Progress Notes (Signed)
 Synopsis: Referred in by No ref. provider found   Subjective:   PATIENT ID: Nicole Gross GENDER: female DOB: 04/20/1986, MRN: [id number]  Chief Complaint  Patient presents with   Consult    HPI [name] is a 38 years old female with a past medical history of moderate persistent asthma presenting to the pulmonary clinic to establish care.   Appears to be in an asthma exacerbation, was diagnosed with the flu last week and has been having shortness of breath with cough and sputum production. She received Z-pak and prednisone and is improving. However she ran out of her advair and is not currently using any inhalers.   She was diagnosed with asthma as a child. She had a recent asthma exacerbation at [facility] requiring hospitalization and has been on Advair since then.   Her usual sx include asthma, sob and dry cough. Her asthma flares with viral infections. She does report hypersensitivity to strong scents, cold air and high humidity.   FH - mom with asthma    SH - Active smoke, 1/2 ppd started at 18. 1 dog at home. No kids.   ROS All systems were reviewed and are negative except for the above.  Objective:   Vitals:   09/06/23 1555  BP: 114/80  Pulse: (!) 101  Temp: 98.8 F (37.1 C)  TempSrc: Oral  SpO2: 98%  Weight: 185 lb 6.4 oz (84.1 kg)  Height: 4\' 11"  (1.499 m)   98% on RA BMI Readings from Last 3 Encounters:  09/06/23 37.45 kg/m  07/24/23 37.47 kg/m  04/12/23 36.17 kg/m   Wt Readings from Last 3 Encounters:  09/06/23 185 lb 6.4 oz (84.1 kg)  07/24/23 185 lb 8 oz (84.1 kg)  04/12/23 179 lb 1.6 oz (81.2 kg)    Physical Exam GEN: NAD, congested and coughing intermittently  HEENT: Supple Neck, Reactive Pupils, EOMI  CVS: Normal S1, Normal S2, RRR, No murmurs or ES appreciated  Lungs: Clear bilateral air entry.  Abdomen: Soft, non tender, non distended, + BS  Extremities: Warm and well perfused, No edema  Skin: No suspicious lesions appreciated  Psych:  Normal Affect  Ancillary Information   CBC    Component Value Date/Time   WBC 13.9 (H) 12/24/2022 1953   RBC 3.83 (L) 12/24/2022 1953   HGB 10.2 (L) 12/24/2022 1953   HGB 13.4 08/13/2013 2130   HCT 33.2 (L) 12/24/2022 1953   HCT 39.7 08/13/2013 2130   PLT 377 12/24/2022 1953   PLT 244 08/13/2013 2130   MCV 86.7 12/24/2022 1953   MCV 93 08/13/2013 2130   MCH 26.6 12/24/2022 1953   MCHC 30.7 12/24/2022 1953   RDW 15.5 12/24/2022 1953   RDW 12.4 08/13/2013 2130   LYMPHSABS 2.2 10/31/2022 1943   LYMPHSABS 2.7 08/13/2013 2130   MONOABS 0.7 10/31/2022 1943   MONOABS 0.4 08/13/2013 2130   EOSABS 0.5 10/31/2022 1943   EOSABS 0.2 08/13/2013 2130   BASOSABS 0.1 10/31/2022 1943   BASOSABS 0.1 08/13/2013 2130   All systems were reviewed and are negative except for the above.      No data to display           Assessment & Plan:  [name] is a 38 years old female with a past medical history of moderate persistent asthma presenting to the pulmonary clinic to establish care.   #Moderate persistent asthma with acute exacerbation secondary to Influenza.   []  Start Fluticasone-salmeterol [Advair HFA] 2puffs twice a day.  []   Airsupra as needed  []  Completing steroid taper.  []  Will see her back in 4 weeks and order PFTs, cbc w/ diff and Allergen panel for full phenotyping.   Return in about 4 weeks (around 10/04/2023).  I spent 60 minutes caring for this patient today, including preparing to see the patient, obtaining a medical history , reviewing a separately obtained history, performing a medically appropriate examination and/or evaluation, counseling and educating the patient/family/caregiver, ordering medications, tests, or procedures, documenting clinical information in the electronic health record, and independently interpreting results (not separately reported/billed) and communicating results to the patient/family/caregiver  Janann Colonel, MD [facility] 09/06/2023 6:44 PM

## 2023-09-06 NOTE — Progress Notes (Signed)
  Has the diagnosis of asthma as a child. Hospitalized at [facility] for asthma exacerbation. Did not require intubation in the past.   Sx usually wheezing sob cough. Usually asthma flares with viral infection.   Currently in an asthma exacerbation.   Ran out of advair for a month.   Hypersensitive to strong scents, cold air and high humidity.   FH - mom with asthma   SH - Active smoke, 1/2 ppd started at 18. 1 dog at home. No kids.

## 2023-09-08 LAB — GONOCOCCUS CULTURE

## 2023-09-12 ENCOUNTER — Telehealth: Payer: Self-pay | Admitting: Pulmonary Disease

## 2023-09-12 NOTE — Telephone Encounter (Signed)
 Patient states Nicole Gross and Advair is too expensive, Pharmacy is [facility] [address] Patient phone number is [phone].

## 2023-09-13 ENCOUNTER — Other Ambulatory Visit (HOSPITAL_COMMUNITY): Payer: Self-pay

## 2023-09-13 ENCOUNTER — Telehealth: Payer: Self-pay

## 2023-09-13 NOTE — Telephone Encounter (Signed)
 Pharmacy Patient Advocate Encounter   Received notification from CoverMyMeds that prior authorization for Airsupra 90-80MCG/ACT aerosol is required/requested.   Insurance verification completed.   The patient is insured through [facility] .   KEY  [id number]  Prior Authorization form/request asks a question that requires your assistance. Please see the question below and advise accordingly. The PA will not be submitted until the necessary information is received.

## 2023-09-13 NOTE — Telephone Encounter (Signed)
 I do not see where Dulera and Symbicort was tried by pt. Please advise.

## 2023-09-13 NOTE — Telephone Encounter (Signed)
 Please see encounter from earlier today 09-13-2023

## 2023-09-13 NOTE — Telephone Encounter (Signed)
 Another note is already posted for this and sent to Dr Larinda Buttery. Completing this as we do not need 2 notes for the same thing.

## 2023-09-13 NOTE — Telephone Encounter (Signed)
 I called and spoke with pt. Pt states Airsupra and Advair were expensive and would like and alternative. Pt stated she has not contacted her insurance company to see what is covered. Routing to pharmacy team to see if they can maybe inform us on what inhalers her insurance will cover.

## 2023-09-18 NOTE — Telephone Encounter (Signed)
 PT calling about PA. States she has not tried any of the alternatives listed below. Please call to advise next steps. Thanks. Her # is [phone]

## 2023-09-19 NOTE — Telephone Encounter (Signed)
 Medication will not be covered since patient has not tried any alternatives or failed Albuterol inhalers

## 2023-10-01 ENCOUNTER — Telehealth: Payer: Self-pay | Admitting: Pulmonary Disease

## 2023-10-01 DIAGNOSIS — R0602 Shortness of breath: Secondary | ICD-10-CM

## 2023-10-01 NOTE — Telephone Encounter (Signed)
 I spoke with the patient. She said she does not have any inhalers at this time. She knows the Paulene Floor is not covered by her insurance but she said the Albuterol does not work for her.  I called [facility] pharmacy and the  Advair is $400 and the generic is $480.  Pharmacy team please see what a cheaper alternative to the Advair would be.

## 2023-10-01 NOTE — Telephone Encounter (Signed)
 Patient checking on new RX for inhalers. Patient states needs two generic inhalers. Pharmacy is [facility] [place]. Patient phone number is [phone].

## 2023-10-02 ENCOUNTER — Other Ambulatory Visit (HOSPITAL_COMMUNITY): Payer: Self-pay

## 2023-10-02 ENCOUNTER — Encounter: Payer: Self-pay | Admitting: Pulmonary Disease

## 2023-10-02 ENCOUNTER — Other Ambulatory Visit: Payer: Self-pay

## 2023-10-02 MED ORDER — ALBUTEROL SULFATE HFA 108 (90 BASE) MCG/ACT IN AERS
2.0000 | INHALATION_SPRAY | Freq: Four times a day (QID) | RESPIRATORY_TRACT | 11 refills | Status: AC | PRN
  Filled 2023-10-02: qty 18, 25d supply, fill #0

## 2023-10-02 MED ORDER — PREDNISONE 20 MG PO TABS
20.0000 mg | ORAL_TABLET | Freq: Every day | ORAL | 0 refills | Status: AC
Start: 2023-10-02 — End: 2023-10-13
  Filled 2023-10-02: qty 5, 5d supply, fill #0

## 2023-10-02 MED ORDER — FLUTICASONE-SALMETEROL 500-50 MCG/ACT IN AEPB
1.0000 | INHALATION_SPRAY | Freq: Two times a day (BID) | RESPIRATORY_TRACT | 3 refills | Status: DC
  Filled 2023-10-02 – 2023-10-08 (×2): qty 180, 90d supply, fill #0

## 2023-10-02 NOTE — Telephone Encounter (Signed)
 I have notified the patient. She did ask about an alternative for the Albuterol inhaler. She said it was not effective.   Per Dr.Assaker, she needs to use the Adviar Bid and Albuterol as needed.   I have notified the patient. She needed a refill on the Albuterol as well.I have sent that to the [facility] pharmacy.  Nothing further needed.

## 2023-10-02 NOTE — Addendum Note (Signed)
 Addended by: Bonney Leitz on: 10/02/2023 02:10 PM   Modules accepted: Orders

## 2023-10-02 NOTE — Telephone Encounter (Signed)
 See telephone encounter from 3/24. The patient is aware. .  Nothing further needed.

## 2023-10-02 NOTE — Telephone Encounter (Signed)
 Pt calling asking for another round of prednisone and an update on the inhaler sityation

## 2023-10-02 NOTE — Telephone Encounter (Signed)
 Test claims are showing that patient has multiple plans on file-with both plans being run the price for the Brand Advair is $4.00 through [employer].

## 2023-10-08 ENCOUNTER — Other Ambulatory Visit: Payer: Self-pay

## 2023-10-19 ENCOUNTER — Other Ambulatory Visit: Payer: Self-pay

## 2023-10-23 ENCOUNTER — Ambulatory Visit: Payer: PRIVATE HEALTH INSURANCE

## 2023-11-27 ENCOUNTER — Other Ambulatory Visit (HOSPITAL_BASED_OUTPATIENT_CLINIC_OR_DEPARTMENT_OTHER): Payer: Self-pay

## 2023-11-27 ENCOUNTER — Other Ambulatory Visit: Payer: Self-pay

## 2023-12-04 ENCOUNTER — Ambulatory Visit: Payer: Self-pay | Admitting: Pulmonary Disease

## 2023-12-04 NOTE — Telephone Encounter (Addendum)
 Copied from CRM [id number]. Topic: Clinical - Red Word Triage >> Dec 04, 2023  2:13 PM Whitney O wrote: [facility] that prompted transfer to Nurse Triage: shortness of breath has copd and asthma  TRIAGE SUMMARY NOTE: Pt reporting that she has been experiencing more SOB than usual for the past 2 weeks, went to UC on 5/17 and prescribed antibx and prednisone  50 mg but feeling the same. Pt reporting she can't breathe when laying flat, wheezing all the time, having coughing fits to no sputum, intermittent heart racing and lightheadedness, speaking in phrases on phone, and confirms that she has been using her maintenance Advair  inhaler every 20 min since breathing "gotten really bad" and confirms albuterol  nebulizer every 4 hours. Advised pt go to ED straight away for symptoms and to ensure no side effects from extra doses of Advair , advised that Advair  is to be used only 2x/day. Pt verbalized understanding and states she will go to [facility]. Please advise if further recommendations.  E2C2 Pulmonary Triage - Initial Assessment Questions "Chief Complaint (e.g., cough, sob, wheezing, fever, chills, sweat or additional symptoms) *Go to specific symptom protocol after initial questions. More SOB Went to UC gave 50 mg prednisone  Feels the same, prednisone  makes me jittery Lay flat can't breathe Wheezing all the time No chest pain, dizziness, weakness, fever Coughing fits, nothing coming up Think antibx opening me up and making me sicker Heart racing when do prednisone  and nebulizer Thought was going to pass out other night with prednisone  so taking half at a time Speaking in phrases  "How long have symptoms been present?" Past 2 weeks  "Have you used your inhalers/maintenance medication?" Yes If yes, "What medications?" Advair  inhaler and albuterol  nebulizer  If inhaler, ask "How many puffs and how often?" Note: Review instructions on medication in the chart. More than needed, advair  every 20 min,  gotten really bad, nebulizer every 4 hours  Reason for Disposition  [1] MODERATE difficulty breathing (e.g., speaks in phrases, SOB even at rest, pulse 100-120) AND [2] NEW-onset or WORSE than normal  Answer Assessment - Initial Assessment Questions 7. LUNG HISTORY: "Do you have any history of lung disease?"  (e.g., pulmonary embolus, asthma, emphysema)     Asthma, COPD  Protocols used: Breathing Difficulty-A-AH

## 2023-12-17 ENCOUNTER — Ambulatory Visit: Admitting: Obstetrics & Gynecology

## 2023-12-19 ENCOUNTER — Encounter: Payer: Self-pay | Admitting: Obstetrics & Gynecology

## 2023-12-26 ENCOUNTER — Inpatient Hospital Stay
Admission: RE | Admit: 2023-12-26 | Discharge: 2023-12-26 | Disposition: A | Discharge status: Home / Self Care | Payer: Self-pay | Source: Ambulatory Visit | Attending: Pulmonary Disease

## 2023-12-26 ENCOUNTER — Ambulatory Visit (INDEPENDENT_AMBULATORY_CARE_PROVIDER_SITE_OTHER): Admitting: Pulmonary Disease

## 2023-12-26 ENCOUNTER — Telehealth: Payer: Self-pay

## 2023-12-26 ENCOUNTER — Other Ambulatory Visit (HOSPITAL_COMMUNITY): Payer: Self-pay

## 2023-12-26 ENCOUNTER — Encounter: Payer: Self-pay | Admitting: Pulmonary Disease

## 2023-12-26 VITALS — BP 122/80 | HR 90 | Temp 97.1°F | Ht 59.0 in | Wt 204.4 lb

## 2023-12-26 DIAGNOSIS — Z7952 Long term (current) use of systemic steroids: Secondary | ICD-10-CM

## 2023-12-26 DIAGNOSIS — J4551 Severe persistent asthma with (acute) exacerbation: Secondary | ICD-10-CM | POA: Diagnosis not present

## 2023-12-26 DIAGNOSIS — Z9289 Personal history of other medical treatment: Secondary | ICD-10-CM

## 2023-12-26 MED ORDER — ALBUTEROL SULFATE (2.5 MG/3ML) 0.083% IN NEBU
2.5000 mg | INHALATION_SOLUTION | Freq: Once | RESPIRATORY_TRACT | Status: AC
  Administered 2023-12-26: 2.5 mg via RESPIRATORY_TRACT

## 2023-12-26 MED ORDER — FLUTICASONE-SALMETEROL 500-50 MCG/ACT IN AEPB: 1.0000 | INHALATION_SPRAY | Freq: Two times a day (BID) | RESPIRATORY_TRACT | 3 refills | Status: AC

## 2023-12-26 MED ORDER — METHYLPREDNISOLONE ACETATE 80 MG/ML IJ SUSP
80.0000 mg | Freq: Once | INTRAMUSCULAR | Status: AC
  Administered 2023-12-26: 80 mg via INTRAMUSCULAR

## 2023-12-26 MED ORDER — METHYLPREDNISOLONE SODIUM SUCC 40 MG IJ SOLR: 60.0000 mg | Freq: Once | INTRAMUSCULAR | Status: DC

## 2023-12-26 NOTE — Progress Notes (Unsigned)
 Synopsis: Referred in by No ref. provider found   Subjective:   PATIENT ID: Nicole Gross GENDER: female DOB: October 19, 1985, MRN: [id number]  No chief complaint on file.   HPI Nicole Gross is a 38 years old female with a past medical history of moderate persistent asthma presenting to the pulmonary clinic to establish care.   Appears to be in an asthma exacerbation, was diagnosed with the flu last week and has been having shortness of breath with cough and sputum production. She received Z-pak and prednisone  and is improving. However she ran out of her advair  and is not currently using any inhalers.   She was diagnosed with asthma as a child. She had a recent asthma exacerbation at [facility] requiring hospitalization and has been on Advair  since then.   Her usual sx include asthma, sob and dry cough. Her asthma flares with viral infections. She does report hypersensitivity to strong scents, cold air and high humidity.   FH - mom with asthma    SH - Active smoke, 1/2 ppd started at 18. 1 dog at home. No kids.   ROS All systems were reviewed and are negative except for the above.  Objective:   There were no vitals filed for this visit.    on RA BMI Readings from Last 3 Encounters:  09/06/23 37.45 kg/m  07/24/23 37.47 kg/m  04/12/23 36.17 kg/m   Wt Readings from Last 3 Encounters:  09/06/23 185 lb 6.4 oz (84.1 kg)  07/24/23 185 lb 8 oz (84.1 kg)  04/12/23 179 lb 1.6 oz (81.2 kg)    Physical Exam GEN: NAD, congested and coughing intermittently  HEENT: Supple Neck, Reactive Pupils, EOMI  CVS: Normal S1, Normal S2, RRR, No murmurs or ES appreciated  Lungs: Clear bilateral air entry.  Abdomen: Soft, non tender, non distended, + BS  Extremities: Warm and well perfused, No edema  Skin: No suspicious lesions appreciated  Psych: Normal Affect  Ancillary Information   CBC    Component Value Date/Time   WBC 13.9 (H) 12/24/2022 1953   RBC 3.83 (L) 12/24/2022 1953   HGB 10.2 (L)  12/24/2022 1953   HGB 13.4 08/13/2013 2130   HCT 33.2 (L) 12/24/2022 1953   HCT 39.7 08/13/2013 2130   PLT 377 12/24/2022 1953   PLT 244 08/13/2013 2130   MCV 86.7 12/24/2022 1953   MCV 93 08/13/2013 2130   MCH 26.6 12/24/2022 1953   MCHC 30.7 12/24/2022 1953   RDW 15.5 12/24/2022 1953   RDW 12.4 08/13/2013 2130   LYMPHSABS 2.2 10/31/2022 1943   LYMPHSABS 2.7 08/13/2013 2130   MONOABS 0.7 10/31/2022 1943   MONOABS 0.4 08/13/2013 2130   EOSABS 0.5 10/31/2022 1943   EOSABS 0.2 08/13/2013 2130   BASOSABS 0.1 10/31/2022 1943   BASOSABS 0.1 08/13/2013 2130   All systems were reviewed and are negative except for the above.      No data to display           Assessment & Plan:  Nicole Gross is a 38 years old female with a past medical history of moderate persistent asthma presenting to the pulmonary clinic to establish care.   #Moderate persistent asthma with acute exacerbation secondary to Influenza.   []  Start Fluticasone -salmeterol [Advair  HFA] 2puffs twice a day.  []  Airsupra  as needed  []  Completing steroid taper.  []  Will see her back in 4 weeks and order PFTs, cbc w/ diff and Allergen panel for full phenotyping.   No follow-ups  on file.  I spent 60 minutes caring for this patient today, including preparing to see the patient, obtaining a medical history , reviewing a separately obtained history, performing a medically appropriate examination and/or evaluation, counseling and educating the patient/family/caregiver, ordering medications, tests, or procedures, documenting clinical information in the electronic health record, and independently interpreting results (not separately reported/billed) and communicating results to the patient/family/caregiver  Darrin Barn, MD [facility] 12/26/2023 1:51 PM

## 2023-12-26 NOTE — Telephone Encounter (Signed)
*  Pulm  Pharmacy Patient Advocate Encounter   Received notification from CoverMyMeds that prior authorization for Fluticasone -Salmeterol 500-50MCG/ACT aerosol powder  is required/requested.   Insurance verification completed.   The patient is insured through [facility] .   Per test claim: PA required; PA started via CoverMyMeds. KEY [id number] . Please see clinical question(s) below that I am not finding the answer to in their chart and advise.

## 2024-01-03 NOTE — Telephone Encounter (Signed)
 Closing request due to no response.

## 2024-01-22 ENCOUNTER — Ambulatory Visit: Admitting: Pulmonary Disease

## 2024-01-23 NOTE — ED Provider Notes (Signed)
 [facility] Emergency Department Progress Note  January 23, 2024 7:02 AM  ED care from Dr. Oneil Hoppens at 0700. Nicole Gross is a 38 y.o. female with a PMH of asthma and COPD who presents for 1 day of left neck pain and stiffness  with the inability to mobilize her neck.   Medical Decision Making and Progress Notes   On exam from previous provider, the patient appears uncomfortable but nontoxic. VS are notable for tachycardia to 110. Patient keeps head straight, not wanting to look left or right or up or down. She has tenderness over sternocleidomastoid, left upper trapezius, and left paraspinal muscle group.   Plan for IV Valium and Toradol .    External Records Reviewed: Patient's most recent outpatient clinic note Social determinants that significantly affected care: None applicable History obtained from other sources: None  Additional Progress Notes and Critical Care  8:22 AM On reassessment, patient states she is feeling better, can mobilize her neck, and tolerate PO. Feel it is appropriate to discharge patient at this time. Return precautions were explained, along with follow up instructions. The patient is comfortable with the plan and expresses understanding. Discharged home with Valium and NSAID.   Portions of this record have been created using Scientist, clinical (histocompatibility and immunogenetics). Dictation errors have been sought, but may not have been identified and corrected.    ED Clinical Impression   Final diagnoses:  Cervical paraspinous muscle spasm (Primary)   Documentation assistance was provided by Schuyler Gibney, Scribe on January 23, 2024 at 7:03 AM for Hoy Slade, MD.  January 23, 2024 8:46 AM. Documentation assistance provided by the scribe. I was present during the time the encounter was recorded. The information recorded by the scribe was done at my direction and has been reviewed and validated by me.

## 2024-03-06 ENCOUNTER — Ambulatory Visit: Admitting: Licensed Practical Nurse
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
## 2024-04-29 ENCOUNTER — Ambulatory Visit: Admitting: Obstetrics and Gynecology

## 2024-04-29 DIAGNOSIS — R102 Pelvic and perineal pain unspecified side: Secondary | ICD-10-CM

## 2024-04-29 DIAGNOSIS — D219 Benign neoplasm of connective and other soft tissue, unspecified: Secondary | ICD-10-CM

## 2024-04-29 DIAGNOSIS — Z01419 Encounter for gynecological examination (general) (routine) without abnormal findings: Secondary | ICD-10-CM

## 2024-04-29 DIAGNOSIS — Z3042 Encounter for surveillance of injectable contraceptive: Secondary | ICD-10-CM
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
## 2024-05-13 ENCOUNTER — Ambulatory Visit: Admitting: Obstetrics and Gynecology

## 2024-05-13 DIAGNOSIS — D219 Benign neoplasm of connective and other soft tissue, unspecified: Secondary | ICD-10-CM

## 2024-05-13 DIAGNOSIS — Z3042 Encounter for surveillance of injectable contraceptive: Secondary | ICD-10-CM

## 2024-05-13 DIAGNOSIS — N920 Excessive and frequent menstruation with regular cycle: Secondary | ICD-10-CM

## 2024-05-13 DIAGNOSIS — Z01419 Encounter for gynecological examination (general) (routine) without abnormal findings: Secondary | ICD-10-CM
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
## 2024-06-09 ENCOUNTER — Ambulatory Visit: Admitting: Obstetrics and Gynecology

## 2024-06-09 DIAGNOSIS — D219 Benign neoplasm of connective and other soft tissue, unspecified: Secondary | ICD-10-CM

## 2024-06-09 DIAGNOSIS — Z01419 Encounter for gynecological examination (general) (routine) without abnormal findings: Secondary | ICD-10-CM
